# Patient Record
Sex: Female | Born: 1945 | Race: White | Hispanic: No | Marital: Married | Smoking: Never smoker
Health system: Southern US, Community
[De-identification: ages and names within clinical notes are randomized; demographics above are authoritative.]

## PROBLEM LIST (undated history)

## (undated) DIAGNOSIS — I2510 Atherosclerotic heart disease of native coronary artery without angina pectoris: Secondary | ICD-10-CM

## (undated) DIAGNOSIS — R51 Headache: Secondary | ICD-10-CM

## (undated) DIAGNOSIS — I2699 Other pulmonary embolism without acute cor pulmonale: Secondary | ICD-10-CM

## (undated) DIAGNOSIS — I10 Essential (primary) hypertension: Secondary | ICD-10-CM

## (undated) DIAGNOSIS — E785 Hyperlipidemia, unspecified: Secondary | ICD-10-CM

## (undated) DIAGNOSIS — Z8619 Personal history of other infectious and parasitic diseases: Secondary | ICD-10-CM

## (undated) DIAGNOSIS — M3500 Sicca syndrome, unspecified: Secondary | ICD-10-CM

## (undated) DIAGNOSIS — R7303 Prediabetes: Secondary | ICD-10-CM

## (undated) DIAGNOSIS — I639 Cerebral infarction, unspecified: Secondary | ICD-10-CM

## (undated) DIAGNOSIS — K219 Gastro-esophageal reflux disease without esophagitis: Secondary | ICD-10-CM

## (undated) DIAGNOSIS — H409 Unspecified glaucoma: Secondary | ICD-10-CM

## (undated) DIAGNOSIS — R519 Headache, unspecified: Secondary | ICD-10-CM

## (undated) HISTORY — DX: Hyperlipidemia, unspecified: E78.5

## (undated) HISTORY — PX: GANGLION CYST EXCISION: SHX1691

## (undated) HISTORY — PX: LASIK: SHX215

## (undated) HISTORY — PX: HERNIA REPAIR: SHX51

---

## 1996-02-28 DIAGNOSIS — I639 Cerebral infarction, unspecified: Secondary | ICD-10-CM

## 1996-02-28 HISTORY — DX: Cerebral infarction, unspecified: I63.9

## 1996-11-15 DIAGNOSIS — I6782 Cerebral ischemia: Secondary | ICD-10-CM | POA: Insufficient documentation

## 2004-03-01 ENCOUNTER — Ambulatory Visit: Payer: Self-pay | Admitting: Gastroenterology

## 2004-10-11 ENCOUNTER — Ambulatory Visit: Payer: Self-pay | Admitting: Unknown Physician Specialty

## 2005-02-27 DIAGNOSIS — I2699 Other pulmonary embolism without acute cor pulmonale: Secondary | ICD-10-CM

## 2005-02-27 HISTORY — DX: Other pulmonary embolism without acute cor pulmonale: I26.99

## 2005-02-27 HISTORY — PX: ANKLE FRACTURE SURGERY: SHX122

## 2005-10-23 ENCOUNTER — Ambulatory Visit: Payer: Self-pay | Admitting: Unknown Physician Specialty

## 2006-01-02 ENCOUNTER — Other Ambulatory Visit: Payer: Self-pay

## 2006-01-03 ENCOUNTER — Inpatient Hospital Stay: Payer: Self-pay | Admitting: Internal Medicine

## 2006-02-27 HISTORY — PX: KNEE ARTHROSCOPY W/ ACL RECONSTRUCTION: SHX1858

## 2006-06-15 ENCOUNTER — Ambulatory Visit: Payer: Self-pay | Admitting: Family Medicine

## 2006-06-19 ENCOUNTER — Ambulatory Visit: Payer: Self-pay | Admitting: Family Medicine

## 2006-07-17 ENCOUNTER — Ambulatory Visit: Payer: Self-pay

## 2006-10-26 ENCOUNTER — Ambulatory Visit: Payer: Self-pay | Admitting: Unknown Physician Specialty

## 2007-02-08 ENCOUNTER — Ambulatory Visit: Payer: Self-pay | Admitting: Family Medicine

## 2007-07-31 ENCOUNTER — Ambulatory Visit: Payer: Self-pay | Admitting: General Practice

## 2007-08-05 ENCOUNTER — Ambulatory Visit: Payer: Self-pay | Admitting: General Practice

## 2007-08-05 HISTORY — PX: KNEE SURGERY: SHX244

## 2007-11-06 ENCOUNTER — Ambulatory Visit: Payer: Self-pay | Admitting: Unknown Physician Specialty

## 2007-12-06 ENCOUNTER — Ambulatory Visit: Payer: Self-pay | Admitting: Family Medicine

## 2008-01-30 ENCOUNTER — Ambulatory Visit: Payer: Self-pay | Admitting: Family Medicine

## 2008-02-18 ENCOUNTER — Ambulatory Visit: Payer: Self-pay | Admitting: Family Medicine

## 2008-08-27 ENCOUNTER — Ambulatory Visit: Payer: Self-pay | Admitting: Family Medicine

## 2008-11-12 ENCOUNTER — Ambulatory Visit: Payer: Self-pay | Admitting: Unknown Physician Specialty

## 2009-01-28 ENCOUNTER — Ambulatory Visit: Payer: Self-pay | Admitting: Gastroenterology

## 2009-02-12 ENCOUNTER — Ambulatory Visit: Payer: Self-pay | Admitting: Surgery

## 2009-03-09 ENCOUNTER — Ambulatory Visit: Payer: Self-pay | Admitting: Gastroenterology

## 2009-08-17 ENCOUNTER — Ambulatory Visit: Payer: Self-pay | Admitting: Family Medicine

## 2009-08-19 ENCOUNTER — Encounter: Admission: RE | Admit: 2009-08-19 | Discharge: 2009-08-19 | Payer: Self-pay | Admitting: Neurology

## 2009-09-07 ENCOUNTER — Emergency Department: Payer: Self-pay | Admitting: Emergency Medicine

## 2009-09-08 ENCOUNTER — Ambulatory Visit: Payer: Self-pay | Admitting: Surgery

## 2009-09-16 ENCOUNTER — Inpatient Hospital Stay: Payer: Self-pay | Admitting: Surgery

## 2009-12-07 ENCOUNTER — Ambulatory Visit: Payer: Self-pay | Admitting: Unknown Physician Specialty

## 2010-02-27 HISTORY — PX: NISSEN FUNDOPLICATION: SHX2091

## 2010-05-19 ENCOUNTER — Other Ambulatory Visit: Payer: Self-pay | Admitting: Orthopedic Surgery

## 2010-05-19 DIAGNOSIS — M25562 Pain in left knee: Secondary | ICD-10-CM

## 2010-05-21 ENCOUNTER — Ambulatory Visit
Admission: RE | Admit: 2010-05-21 | Discharge: 2010-05-21 | Disposition: A | Discharge status: Home / Self Care | Payer: 59 | Source: Ambulatory Visit | Attending: Orthopedic Surgery | Admitting: Orthopedic Surgery

## 2010-05-21 DIAGNOSIS — M25562 Pain in left knee: Secondary | ICD-10-CM

## 2010-12-15 ENCOUNTER — Ambulatory Visit: Payer: Self-pay | Admitting: Unknown Physician Specialty

## 2010-12-20 ENCOUNTER — Emergency Department: Payer: Self-pay | Admitting: Internal Medicine

## 2010-12-22 ENCOUNTER — Ambulatory Visit: Payer: Self-pay | Admitting: Family Medicine

## 2010-12-23 ENCOUNTER — Ambulatory Visit: Payer: Self-pay | Admitting: Family Medicine

## 2011-01-03 ENCOUNTER — Ambulatory Visit: Payer: Self-pay | Admitting: Emergency Medicine

## 2011-03-02 DIAGNOSIS — R1084 Generalized abdominal pain: Secondary | ICD-10-CM | POA: Diagnosis not present

## 2011-03-02 DIAGNOSIS — R110 Nausea: Secondary | ICD-10-CM | POA: Diagnosis not present

## 2011-03-02 DIAGNOSIS — K5900 Constipation, unspecified: Secondary | ICD-10-CM | POA: Diagnosis not present

## 2011-03-06 DIAGNOSIS — F4320 Adjustment disorder, unspecified: Secondary | ICD-10-CM | POA: Diagnosis not present

## 2011-03-06 DIAGNOSIS — G43909 Migraine, unspecified, not intractable, without status migrainosus: Secondary | ICD-10-CM | POA: Diagnosis not present

## 2011-03-06 DIAGNOSIS — J019 Acute sinusitis, unspecified: Secondary | ICD-10-CM | POA: Diagnosis not present

## 2011-03-06 DIAGNOSIS — R109 Unspecified abdominal pain: Secondary | ICD-10-CM | POA: Diagnosis not present

## 2011-03-15 ENCOUNTER — Ambulatory Visit: Payer: Self-pay | Admitting: Gastroenterology

## 2011-03-15 DIAGNOSIS — J301 Allergic rhinitis due to pollen: Secondary | ICD-10-CM | POA: Diagnosis not present

## 2011-03-15 DIAGNOSIS — R1084 Generalized abdominal pain: Secondary | ICD-10-CM | POA: Diagnosis not present

## 2011-03-15 DIAGNOSIS — R109 Unspecified abdominal pain: Secondary | ICD-10-CM | POA: Diagnosis not present

## 2011-03-20 DIAGNOSIS — J301 Allergic rhinitis due to pollen: Secondary | ICD-10-CM | POA: Diagnosis not present

## 2011-03-22 ENCOUNTER — Ambulatory Visit: Payer: Self-pay | Admitting: Gastroenterology

## 2011-03-22 DIAGNOSIS — K219 Gastro-esophageal reflux disease without esophagitis: Secondary | ICD-10-CM | POA: Diagnosis not present

## 2011-03-22 DIAGNOSIS — R059 Cough, unspecified: Secondary | ICD-10-CM | POA: Diagnosis not present

## 2011-03-22 DIAGNOSIS — K449 Diaphragmatic hernia without obstruction or gangrene: Secondary | ICD-10-CM | POA: Diagnosis not present

## 2011-03-22 DIAGNOSIS — Z9109 Other allergy status, other than to drugs and biological substances: Secondary | ICD-10-CM | POA: Diagnosis not present

## 2011-03-22 DIAGNOSIS — H409 Unspecified glaucoma: Secondary | ICD-10-CM | POA: Diagnosis not present

## 2011-03-22 DIAGNOSIS — Z8679 Personal history of other diseases of the circulatory system: Secondary | ICD-10-CM | POA: Diagnosis not present

## 2011-03-22 DIAGNOSIS — Z79899 Other long term (current) drug therapy: Secondary | ICD-10-CM | POA: Diagnosis not present

## 2011-03-22 DIAGNOSIS — K5900 Constipation, unspecified: Secondary | ICD-10-CM | POA: Diagnosis not present

## 2011-03-22 DIAGNOSIS — E785 Hyperlipidemia, unspecified: Secondary | ICD-10-CM | POA: Diagnosis not present

## 2011-03-22 DIAGNOSIS — R1084 Generalized abdominal pain: Secondary | ICD-10-CM | POA: Diagnosis not present

## 2011-03-22 DIAGNOSIS — G43909 Migraine, unspecified, not intractable, without status migrainosus: Secondary | ICD-10-CM | POA: Diagnosis not present

## 2011-03-22 LAB — HM COLONOSCOPY

## 2011-04-06 DIAGNOSIS — H4010X0 Unspecified open-angle glaucoma, stage unspecified: Secondary | ICD-10-CM | POA: Diagnosis not present

## 2011-04-06 DIAGNOSIS — H2510 Age-related nuclear cataract, unspecified eye: Secondary | ICD-10-CM | POA: Diagnosis not present

## 2011-04-11 DIAGNOSIS — K589 Irritable bowel syndrome without diarrhea: Secondary | ICD-10-CM | POA: Diagnosis not present

## 2011-04-11 DIAGNOSIS — K5900 Constipation, unspecified: Secondary | ICD-10-CM | POA: Diagnosis not present

## 2011-04-21 DIAGNOSIS — J301 Allergic rhinitis due to pollen: Secondary | ICD-10-CM | POA: Diagnosis not present

## 2011-04-24 DIAGNOSIS — H4010X0 Unspecified open-angle glaucoma, stage unspecified: Secondary | ICD-10-CM | POA: Diagnosis not present

## 2011-04-24 DIAGNOSIS — J301 Allergic rhinitis due to pollen: Secondary | ICD-10-CM | POA: Diagnosis not present

## 2011-04-24 DIAGNOSIS — H2510 Age-related nuclear cataract, unspecified eye: Secondary | ICD-10-CM | POA: Diagnosis not present

## 2011-06-16 DIAGNOSIS — R51 Headache: Secondary | ICD-10-CM | POA: Diagnosis not present

## 2011-06-16 DIAGNOSIS — J301 Allergic rhinitis due to pollen: Secondary | ICD-10-CM | POA: Diagnosis not present

## 2011-07-21 DIAGNOSIS — J301 Allergic rhinitis due to pollen: Secondary | ICD-10-CM | POA: Diagnosis not present

## 2011-07-24 DIAGNOSIS — J301 Allergic rhinitis due to pollen: Secondary | ICD-10-CM | POA: Diagnosis not present

## 2011-08-16 DIAGNOSIS — H4010X0 Unspecified open-angle glaucoma, stage unspecified: Secondary | ICD-10-CM | POA: Diagnosis not present

## 2011-08-16 DIAGNOSIS — H2510 Age-related nuclear cataract, unspecified eye: Secondary | ICD-10-CM | POA: Diagnosis not present

## 2011-10-13 DIAGNOSIS — J029 Acute pharyngitis, unspecified: Secondary | ICD-10-CM | POA: Diagnosis not present

## 2011-10-13 DIAGNOSIS — R911 Solitary pulmonary nodule: Secondary | ICD-10-CM | POA: Diagnosis not present

## 2011-10-13 DIAGNOSIS — G43909 Migraine, unspecified, not intractable, without status migrainosus: Secondary | ICD-10-CM | POA: Diagnosis not present

## 2011-10-13 DIAGNOSIS — F4320 Adjustment disorder, unspecified: Secondary | ICD-10-CM | POA: Diagnosis not present

## 2011-10-18 ENCOUNTER — Ambulatory Visit: Payer: Self-pay | Admitting: Family Medicine

## 2011-10-18 DIAGNOSIS — K449 Diaphragmatic hernia without obstruction or gangrene: Secondary | ICD-10-CM | POA: Diagnosis not present

## 2011-10-18 DIAGNOSIS — J301 Allergic rhinitis due to pollen: Secondary | ICD-10-CM | POA: Diagnosis not present

## 2011-10-18 DIAGNOSIS — R911 Solitary pulmonary nodule: Secondary | ICD-10-CM | POA: Diagnosis not present

## 2011-10-23 DIAGNOSIS — J301 Allergic rhinitis due to pollen: Secondary | ICD-10-CM | POA: Diagnosis not present

## 2011-12-01 DIAGNOSIS — R5383 Other fatigue: Secondary | ICD-10-CM | POA: Diagnosis not present

## 2011-12-01 DIAGNOSIS — E7800 Pure hypercholesterolemia, unspecified: Secondary | ICD-10-CM | POA: Diagnosis not present

## 2011-12-01 DIAGNOSIS — Z0000 Encounter for general adult medical examination without abnormal findings: Secondary | ICD-10-CM | POA: Diagnosis not present

## 2011-12-01 DIAGNOSIS — R5381 Other malaise: Secondary | ICD-10-CM | POA: Diagnosis not present

## 2011-12-01 DIAGNOSIS — F4320 Adjustment disorder, unspecified: Secondary | ICD-10-CM | POA: Diagnosis not present

## 2011-12-01 DIAGNOSIS — Z23 Encounter for immunization: Secondary | ICD-10-CM | POA: Diagnosis not present

## 2011-12-04 DIAGNOSIS — R5383 Other fatigue: Secondary | ICD-10-CM | POA: Diagnosis not present

## 2011-12-04 DIAGNOSIS — R7309 Other abnormal glucose: Secondary | ICD-10-CM | POA: Diagnosis not present

## 2011-12-04 DIAGNOSIS — E559 Vitamin D deficiency, unspecified: Secondary | ICD-10-CM | POA: Diagnosis not present

## 2011-12-04 DIAGNOSIS — R5381 Other malaise: Secondary | ICD-10-CM | POA: Diagnosis not present

## 2011-12-04 DIAGNOSIS — R109 Unspecified abdominal pain: Secondary | ICD-10-CM | POA: Diagnosis not present

## 2011-12-04 DIAGNOSIS — E7800 Pure hypercholesterolemia, unspecified: Secondary | ICD-10-CM | POA: Diagnosis not present

## 2012-01-08 DIAGNOSIS — R079 Chest pain, unspecified: Secondary | ICD-10-CM | POA: Diagnosis not present

## 2012-01-08 DIAGNOSIS — G4730 Sleep apnea, unspecified: Secondary | ICD-10-CM | POA: Diagnosis not present

## 2012-01-08 DIAGNOSIS — F4320 Adjustment disorder, unspecified: Secondary | ICD-10-CM | POA: Diagnosis not present

## 2012-01-08 DIAGNOSIS — E7800 Pure hypercholesterolemia, unspecified: Secondary | ICD-10-CM | POA: Diagnosis not present

## 2012-01-15 DIAGNOSIS — E7800 Pure hypercholesterolemia, unspecified: Secondary | ICD-10-CM | POA: Diagnosis not present

## 2012-01-19 DIAGNOSIS — J301 Allergic rhinitis due to pollen: Secondary | ICD-10-CM | POA: Diagnosis not present

## 2012-01-21 ENCOUNTER — Ambulatory Visit: Payer: Self-pay | Admitting: Family Medicine

## 2012-01-21 DIAGNOSIS — R0609 Other forms of dyspnea: Secondary | ICD-10-CM | POA: Diagnosis not present

## 2012-01-21 DIAGNOSIS — R0902 Hypoxemia: Secondary | ICD-10-CM | POA: Diagnosis not present

## 2012-01-21 DIAGNOSIS — G4736 Sleep related hypoventilation in conditions classified elsewhere: Secondary | ICD-10-CM | POA: Diagnosis not present

## 2012-01-21 DIAGNOSIS — G4733 Obstructive sleep apnea (adult) (pediatric): Secondary | ICD-10-CM | POA: Diagnosis not present

## 2012-01-22 DIAGNOSIS — J301 Allergic rhinitis due to pollen: Secondary | ICD-10-CM | POA: Diagnosis not present

## 2012-02-02 ENCOUNTER — Ambulatory Visit: Payer: Self-pay | Admitting: Family Medicine

## 2012-02-02 DIAGNOSIS — R0989 Other specified symptoms and signs involving the circulatory and respiratory systems: Secondary | ICD-10-CM | POA: Diagnosis not present

## 2012-02-02 DIAGNOSIS — R0609 Other forms of dyspnea: Secondary | ICD-10-CM | POA: Diagnosis not present

## 2012-02-02 DIAGNOSIS — G4733 Obstructive sleep apnea (adult) (pediatric): Secondary | ICD-10-CM | POA: Diagnosis not present

## 2012-02-08 DIAGNOSIS — H4010X0 Unspecified open-angle glaucoma, stage unspecified: Secondary | ICD-10-CM | POA: Diagnosis not present

## 2012-02-08 DIAGNOSIS — H2510 Age-related nuclear cataract, unspecified eye: Secondary | ICD-10-CM | POA: Diagnosis not present

## 2012-03-06 ENCOUNTER — Other Ambulatory Visit: Payer: Self-pay | Admitting: Family Medicine

## 2012-03-06 DIAGNOSIS — R059 Cough, unspecified: Secondary | ICD-10-CM | POA: Diagnosis not present

## 2012-03-06 DIAGNOSIS — L989 Disorder of the skin and subcutaneous tissue, unspecified: Secondary | ICD-10-CM | POA: Diagnosis not present

## 2012-03-06 DIAGNOSIS — G4730 Sleep apnea, unspecified: Secondary | ICD-10-CM | POA: Diagnosis not present

## 2012-03-06 DIAGNOSIS — F4320 Adjustment disorder, unspecified: Secondary | ICD-10-CM | POA: Diagnosis not present

## 2012-03-06 DIAGNOSIS — E7800 Pure hypercholesterolemia, unspecified: Secondary | ICD-10-CM | POA: Diagnosis not present

## 2012-03-06 DIAGNOSIS — R05 Cough: Secondary | ICD-10-CM | POA: Diagnosis not present

## 2012-03-06 LAB — COMPREHENSIVE METABOLIC PANEL
Albumin: 4.2 g/dL (ref 3.4–5.0)
Alkaline Phosphatase: 64 U/L (ref 50–136)
Anion Gap: 6 — ABNORMAL LOW (ref 7–16)
BUN: 16 mg/dL (ref 7–18)
Bilirubin,Total: 0.5 mg/dL (ref 0.2–1.0)
Calcium, Total: 9.3 mg/dL (ref 8.5–10.1)
Chloride: 105 mmol/L (ref 98–107)
Co2: 28 mmol/L (ref 21–32)
Creatinine: 0.8 mg/dL (ref 0.60–1.30)
EGFR (African American): >60
EGFR (Non-African Amer.): >60
Glucose: 92 mg/dL (ref 65–99)
Osmolality: 278 (ref 275–301)
Potassium: 4.3 mmol/L (ref 3.5–5.1)
SGOT(AST): 26 U/L (ref 15–37)
SGPT (ALT): 29 U/L (ref 12–78)
Sodium: 139 mmol/L (ref 136–145)
Total Protein: 7.7 g/dL (ref 6.4–8.2)

## 2012-03-06 LAB — LIPID PANEL
Cholesterol: 166 mg/dL (ref 0–200)
HDL Cholesterol: 76 mg/dL — ABNORMAL HIGH (ref 40–60)
Ldl Cholesterol, Calc: 71 mg/dL (ref 0–100)
Triglycerides: 93 mg/dL (ref 0–200)
VLDL Cholesterol, Calc: 19 mg/dL (ref 5–40)

## 2012-03-06 LAB — CK: CK, Total: 114 U/L (ref 21–215)

## 2012-03-22 DIAGNOSIS — F4320 Adjustment disorder, unspecified: Secondary | ICD-10-CM | POA: Diagnosis not present

## 2012-03-22 DIAGNOSIS — G4730 Sleep apnea, unspecified: Secondary | ICD-10-CM | POA: Diagnosis not present

## 2012-03-22 DIAGNOSIS — J019 Acute sinusitis, unspecified: Secondary | ICD-10-CM | POA: Diagnosis not present

## 2012-03-22 DIAGNOSIS — L989 Disorder of the skin and subcutaneous tissue, unspecified: Secondary | ICD-10-CM | POA: Diagnosis not present

## 2012-03-27 DIAGNOSIS — G4733 Obstructive sleep apnea (adult) (pediatric): Secondary | ICD-10-CM | POA: Diagnosis not present

## 2012-03-27 DIAGNOSIS — R0609 Other forms of dyspnea: Secondary | ICD-10-CM | POA: Diagnosis not present

## 2012-03-27 DIAGNOSIS — R0989 Other specified symptoms and signs involving the circulatory and respiratory systems: Secondary | ICD-10-CM | POA: Diagnosis not present

## 2012-04-03 DIAGNOSIS — H04129 Dry eye syndrome of unspecified lacrimal gland: Secondary | ICD-10-CM | POA: Diagnosis not present

## 2012-04-03 DIAGNOSIS — H4010X0 Unspecified open-angle glaucoma, stage unspecified: Secondary | ICD-10-CM | POA: Diagnosis not present

## 2012-04-19 DIAGNOSIS — J301 Allergic rhinitis due to pollen: Secondary | ICD-10-CM | POA: Diagnosis not present

## 2012-04-22 DIAGNOSIS — J301 Allergic rhinitis due to pollen: Secondary | ICD-10-CM | POA: Diagnosis not present

## 2012-06-12 DIAGNOSIS — R030 Elevated blood-pressure reading, without diagnosis of hypertension: Secondary | ICD-10-CM | POA: Diagnosis not present

## 2012-06-12 DIAGNOSIS — G4730 Sleep apnea, unspecified: Secondary | ICD-10-CM | POA: Diagnosis not present

## 2012-06-12 DIAGNOSIS — E7800 Pure hypercholesterolemia, unspecified: Secondary | ICD-10-CM | POA: Diagnosis not present

## 2012-06-12 DIAGNOSIS — F4320 Adjustment disorder, unspecified: Secondary | ICD-10-CM | POA: Diagnosis not present

## 2012-06-16 ENCOUNTER — Emergency Department: Payer: Self-pay | Admitting: Emergency Medicine

## 2012-06-16 DIAGNOSIS — Z8673 Personal history of transient ischemic attack (TIA), and cerebral infarction without residual deficits: Secondary | ICD-10-CM | POA: Diagnosis not present

## 2012-06-16 DIAGNOSIS — K602 Anal fissure, unspecified: Secondary | ICD-10-CM | POA: Diagnosis not present

## 2012-06-16 DIAGNOSIS — Z79899 Other long term (current) drug therapy: Secondary | ICD-10-CM | POA: Diagnosis not present

## 2012-06-16 DIAGNOSIS — K649 Unspecified hemorrhoids: Secondary | ICD-10-CM | POA: Diagnosis not present

## 2012-06-16 DIAGNOSIS — Z7982 Long term (current) use of aspirin: Secondary | ICD-10-CM | POA: Diagnosis not present

## 2012-06-16 DIAGNOSIS — K5900 Constipation, unspecified: Secondary | ICD-10-CM | POA: Diagnosis not present

## 2012-06-16 DIAGNOSIS — Z888 Allergy status to other drugs, medicaments and biological substances status: Secondary | ICD-10-CM | POA: Diagnosis not present

## 2012-06-16 LAB — CBC
HCT: 42.5 % (ref 35.0–47.0)
HGB: 14.4 g/dL (ref 12.0–16.0)
MCH: 32 pg (ref 26.0–34.0)
MCHC: 33.8 g/dL (ref 32.0–36.0)
MCV: 95 fL (ref 80–100)
Platelet: 237 10*3/uL (ref 150–440)
RBC: 4.49 10*6/uL (ref 3.80–5.20)
RDW: 13.1 % (ref 11.5–14.5)
WBC: 8.6 10*3/uL (ref 3.6–11.0)

## 2012-06-16 LAB — COMPREHENSIVE METABOLIC PANEL
Albumin: 3.8 g/dL (ref 3.4–5.0)
Alkaline Phosphatase: 63 U/L (ref 50–136)
Anion Gap: 4 — ABNORMAL LOW (ref 7–16)
BUN: 15 mg/dL (ref 7–18)
Bilirubin,Total: 0.2 mg/dL (ref 0.2–1.0)
Calcium, Total: 8.8 mg/dL (ref 8.5–10.1)
Chloride: 105 mmol/L (ref 98–107)
Co2: 30 mmol/L (ref 21–32)
Creatinine: 0.83 mg/dL (ref 0.60–1.30)
EGFR (African American): >60
EGFR (Non-African Amer.): >60
Glucose: 110 mg/dL — ABNORMAL HIGH (ref 65–99)
Osmolality: 279 (ref 275–301)
Potassium: 4.2 mmol/L (ref 3.5–5.1)
SGOT(AST): 27 U/L (ref 15–37)
SGPT (ALT): 30 U/L (ref 12–78)
Sodium: 139 mmol/L (ref 136–145)
Total Protein: 7.1 g/dL (ref 6.4–8.2)

## 2012-07-16 DIAGNOSIS — J301 Allergic rhinitis due to pollen: Secondary | ICD-10-CM | POA: Diagnosis not present

## 2012-07-24 DIAGNOSIS — J301 Allergic rhinitis due to pollen: Secondary | ICD-10-CM | POA: Diagnosis not present

## 2012-08-13 DIAGNOSIS — H4010X0 Unspecified open-angle glaucoma, stage unspecified: Secondary | ICD-10-CM | POA: Diagnosis not present

## 2012-08-13 DIAGNOSIS — H04129 Dry eye syndrome of unspecified lacrimal gland: Secondary | ICD-10-CM | POA: Diagnosis not present

## 2012-08-22 DIAGNOSIS — R059 Cough, unspecified: Secondary | ICD-10-CM | POA: Diagnosis not present

## 2012-08-22 DIAGNOSIS — J329 Chronic sinusitis, unspecified: Secondary | ICD-10-CM | POA: Diagnosis not present

## 2012-08-22 DIAGNOSIS — R05 Cough: Secondary | ICD-10-CM | POA: Diagnosis not present

## 2012-08-22 DIAGNOSIS — K5900 Constipation, unspecified: Secondary | ICD-10-CM | POA: Diagnosis not present

## 2012-08-22 DIAGNOSIS — J309 Allergic rhinitis, unspecified: Secondary | ICD-10-CM | POA: Diagnosis not present

## 2012-10-11 DIAGNOSIS — J301 Allergic rhinitis due to pollen: Secondary | ICD-10-CM | POA: Diagnosis not present

## 2012-10-16 DIAGNOSIS — J301 Allergic rhinitis due to pollen: Secondary | ICD-10-CM | POA: Diagnosis not present

## 2012-10-18 DIAGNOSIS — J301 Allergic rhinitis due to pollen: Secondary | ICD-10-CM | POA: Diagnosis not present

## 2012-11-06 DIAGNOSIS — H2510 Age-related nuclear cataract, unspecified eye: Secondary | ICD-10-CM | POA: Diagnosis not present

## 2012-11-06 DIAGNOSIS — H4010X0 Unspecified open-angle glaucoma, stage unspecified: Secondary | ICD-10-CM | POA: Diagnosis not present

## 2012-11-06 DIAGNOSIS — G518 Other disorders of facial nerve: Secondary | ICD-10-CM | POA: Diagnosis not present

## 2012-11-06 DIAGNOSIS — H04129 Dry eye syndrome of unspecified lacrimal gland: Secondary | ICD-10-CM | POA: Diagnosis not present

## 2012-12-05 DIAGNOSIS — Z23 Encounter for immunization: Secondary | ICD-10-CM | POA: Diagnosis not present

## 2012-12-06 DIAGNOSIS — R1011 Right upper quadrant pain: Secondary | ICD-10-CM | POA: Diagnosis not present

## 2012-12-06 DIAGNOSIS — J309 Allergic rhinitis, unspecified: Secondary | ICD-10-CM | POA: Diagnosis not present

## 2012-12-06 DIAGNOSIS — Z1331 Encounter for screening for depression: Secondary | ICD-10-CM | POA: Diagnosis not present

## 2012-12-06 DIAGNOSIS — F329 Major depressive disorder, single episode, unspecified: Secondary | ICD-10-CM | POA: Diagnosis not present

## 2012-12-06 DIAGNOSIS — R109 Unspecified abdominal pain: Secondary | ICD-10-CM | POA: Diagnosis not present

## 2012-12-09 ENCOUNTER — Ambulatory Visit: Payer: Self-pay | Admitting: Family Medicine

## 2012-12-09 DIAGNOSIS — R141 Gas pain: Secondary | ICD-10-CM | POA: Diagnosis not present

## 2012-12-09 DIAGNOSIS — R1011 Right upper quadrant pain: Secondary | ICD-10-CM | POA: Diagnosis not present

## 2012-12-09 DIAGNOSIS — K7689 Other specified diseases of liver: Secondary | ICD-10-CM | POA: Diagnosis not present

## 2013-01-21 DIAGNOSIS — J301 Allergic rhinitis due to pollen: Secondary | ICD-10-CM | POA: Diagnosis not present

## 2013-01-27 DIAGNOSIS — J301 Allergic rhinitis due to pollen: Secondary | ICD-10-CM | POA: Diagnosis not present

## 2013-02-10 DIAGNOSIS — J309 Allergic rhinitis, unspecified: Secondary | ICD-10-CM | POA: Diagnosis not present

## 2013-02-10 DIAGNOSIS — F329 Major depressive disorder, single episode, unspecified: Secondary | ICD-10-CM | POA: Diagnosis not present

## 2013-02-10 DIAGNOSIS — J019 Acute sinusitis, unspecified: Secondary | ICD-10-CM | POA: Diagnosis not present

## 2013-02-10 DIAGNOSIS — K5900 Constipation, unspecified: Secondary | ICD-10-CM | POA: Diagnosis not present

## 2013-03-24 DIAGNOSIS — F3289 Other specified depressive episodes: Secondary | ICD-10-CM | POA: Diagnosis not present

## 2013-03-24 DIAGNOSIS — K5900 Constipation, unspecified: Secondary | ICD-10-CM | POA: Diagnosis not present

## 2013-03-24 DIAGNOSIS — M7989 Other specified soft tissue disorders: Secondary | ICD-10-CM | POA: Diagnosis not present

## 2013-03-24 DIAGNOSIS — F329 Major depressive disorder, single episode, unspecified: Secondary | ICD-10-CM | POA: Diagnosis not present

## 2013-03-24 DIAGNOSIS — J309 Allergic rhinitis, unspecified: Secondary | ICD-10-CM | POA: Diagnosis not present

## 2013-04-14 ENCOUNTER — Ambulatory Visit: Payer: Self-pay | Admitting: Family Medicine

## 2013-04-14 DIAGNOSIS — Z1231 Encounter for screening mammogram for malignant neoplasm of breast: Secondary | ICD-10-CM | POA: Diagnosis not present

## 2013-04-14 LAB — HM MAMMOGRAPHY

## 2013-04-22 DIAGNOSIS — J301 Allergic rhinitis due to pollen: Secondary | ICD-10-CM | POA: Diagnosis not present

## 2013-04-28 DIAGNOSIS — J301 Allergic rhinitis due to pollen: Secondary | ICD-10-CM | POA: Diagnosis not present

## 2013-04-29 DIAGNOSIS — H4011X0 Primary open-angle glaucoma, stage unspecified: Secondary | ICD-10-CM | POA: Diagnosis not present

## 2013-04-29 DIAGNOSIS — H04129 Dry eye syndrome of unspecified lacrimal gland: Secondary | ICD-10-CM | POA: Diagnosis not present

## 2013-04-29 DIAGNOSIS — H43819 Vitreous degeneration, unspecified eye: Secondary | ICD-10-CM | POA: Diagnosis not present

## 2013-04-29 DIAGNOSIS — H2510 Age-related nuclear cataract, unspecified eye: Secondary | ICD-10-CM | POA: Diagnosis not present

## 2013-05-05 DIAGNOSIS — T8579XA Infection and inflammatory reaction due to other internal prosthetic devices, implants and grafts, initial encounter: Secondary | ICD-10-CM | POA: Diagnosis not present

## 2013-05-05 DIAGNOSIS — M76829 Posterior tibial tendinitis, unspecified leg: Secondary | ICD-10-CM | POA: Diagnosis not present

## 2013-05-13 LAB — HEPATIC FUNCTION PANEL
ALT: 16 U/L (ref 7–35)
AST: 16 U/L (ref 13–35)
Alkaline Phosphatase: 72 U/L (ref 25–125)
Bilirubin, Total: 0.3 mg/dL

## 2013-05-13 LAB — HEMOGLOBIN A1C: Hgb A1c MFr Bld: 5.9 % (ref 4.0–6.0)

## 2013-05-13 LAB — BASIC METABOLIC PANEL
BUN: 11 mg/dL (ref 4–21)
Creatinine: 0.7 mg/dL (ref 0.5–1.1)
Glucose: 89 mg/dL
Potassium: 4.9 mmol/L (ref 3.4–5.3)
Sodium: 142 mmol/L (ref 137–147)

## 2013-05-13 LAB — TSH: TSH: 1.48 u[IU]/mL (ref 0.41–5.90)

## 2013-05-19 DIAGNOSIS — T8579XA Infection and inflammatory reaction due to other internal prosthetic devices, implants and grafts, initial encounter: Secondary | ICD-10-CM | POA: Diagnosis not present

## 2013-05-19 DIAGNOSIS — M76829 Posterior tibial tendinitis, unspecified leg: Secondary | ICD-10-CM | POA: Diagnosis not present

## 2013-05-26 DIAGNOSIS — K7689 Other specified diseases of liver: Secondary | ICD-10-CM | POA: Diagnosis not present

## 2013-06-04 DIAGNOSIS — E7800 Pure hypercholesterolemia, unspecified: Secondary | ICD-10-CM | POA: Diagnosis not present

## 2013-06-04 DIAGNOSIS — E559 Vitamin D deficiency, unspecified: Secondary | ICD-10-CM | POA: Diagnosis not present

## 2013-06-04 DIAGNOSIS — R7309 Other abnormal glucose: Secondary | ICD-10-CM | POA: Diagnosis not present

## 2013-06-04 DIAGNOSIS — K5900 Constipation, unspecified: Secondary | ICD-10-CM | POA: Diagnosis not present

## 2013-06-09 DIAGNOSIS — M76829 Posterior tibial tendinitis, unspecified leg: Secondary | ICD-10-CM | POA: Diagnosis not present

## 2013-06-17 ENCOUNTER — Emergency Department: Payer: Self-pay | Admitting: Emergency Medicine

## 2013-06-17 DIAGNOSIS — K589 Irritable bowel syndrome without diarrhea: Secondary | ICD-10-CM | POA: Diagnosis not present

## 2013-06-17 DIAGNOSIS — K7689 Other specified diseases of liver: Secondary | ICD-10-CM | POA: Diagnosis not present

## 2013-06-17 DIAGNOSIS — I10 Essential (primary) hypertension: Secondary | ICD-10-CM | POA: Diagnosis not present

## 2013-06-17 DIAGNOSIS — K219 Gastro-esophageal reflux disease without esophagitis: Secondary | ICD-10-CM | POA: Diagnosis not present

## 2013-06-17 DIAGNOSIS — R109 Unspecified abdominal pain: Secondary | ICD-10-CM | POA: Diagnosis not present

## 2013-06-17 DIAGNOSIS — Z8673 Personal history of transient ischemic attack (TIA), and cerebral infarction without residual deficits: Secondary | ICD-10-CM | POA: Diagnosis not present

## 2013-06-17 DIAGNOSIS — H409 Unspecified glaucoma: Secondary | ICD-10-CM | POA: Diagnosis not present

## 2013-06-17 DIAGNOSIS — Z79899 Other long term (current) drug therapy: Secondary | ICD-10-CM | POA: Diagnosis not present

## 2013-06-17 DIAGNOSIS — R197 Diarrhea, unspecified: Secondary | ICD-10-CM | POA: Diagnosis not present

## 2013-06-17 LAB — URINALYSIS, COMPLETE
Bacteria: NONE SEEN
Bilirubin,UR: NEGATIVE
Glucose,UR: NEGATIVE mg/dL (ref 0–75)
Granular Cast: 3
Hyaline Cast: 24
Nitrite: NEGATIVE
Ph: 5 (ref 4.5–8.0)
Protein: 30
RBC,UR: 11 /HPF (ref 0–5)
Specific Gravity: 1.019 (ref 1.003–1.030)
Squamous Epithelial: 1
WBC UR: 6 /HPF (ref 0–5)

## 2013-06-17 LAB — COMPREHENSIVE METABOLIC PANEL
Albumin: 4.9 g/dL (ref 3.4–5.0)
Alkaline Phosphatase: 78 U/L
Anion Gap: 9 (ref 7–16)
BUN: 19 mg/dL — ABNORMAL HIGH (ref 7–18)
Bilirubin,Total: 0.3 mg/dL (ref 0.2–1.0)
Calcium, Total: 10.8 mg/dL — ABNORMAL HIGH (ref 8.5–10.1)
Chloride: 105 mmol/L (ref 98–107)
Co2: 24 mmol/L (ref 21–32)
Creatinine: 1.15 mg/dL (ref 0.60–1.30)
EGFR (African American): 57 — ABNORMAL LOW
EGFR (Non-African Amer.): 49 — ABNORMAL LOW
Glucose: 151 mg/dL — ABNORMAL HIGH (ref 65–99)
Osmolality: 281 (ref 275–301)
Potassium: 4.1 mmol/L (ref 3.5–5.1)
SGOT(AST): 39 U/L — ABNORMAL HIGH (ref 15–37)
SGPT (ALT): 32 U/L (ref 12–78)
Sodium: 138 mmol/L (ref 136–145)
Total Protein: 9 g/dL — ABNORMAL HIGH (ref 6.4–8.2)

## 2013-06-17 LAB — CBC
HCT: 53.5 % — ABNORMAL HIGH (ref 35.0–47.0)
HGB: 17.8 g/dL — ABNORMAL HIGH (ref 12.0–16.0)
MCH: 31.3 pg (ref 26.0–34.0)
MCHC: 33.2 g/dL (ref 32.0–36.0)
MCV: 94 fL (ref 80–100)
Platelet: 287 10*3/uL (ref 150–440)
RBC: 5.67 10*6/uL — ABNORMAL HIGH (ref 3.80–5.20)
RDW: 12.9 % (ref 11.5–14.5)
WBC: 11.4 10*3/uL — ABNORMAL HIGH (ref 3.6–11.0)

## 2013-06-17 LAB — TROPONIN I: Troponin-I: <0.02 ng/mL

## 2013-06-17 LAB — CK TOTAL AND CKMB (NOT AT ARMC)
CK, Total: 94 U/L
CK-MB: 1 ng/mL (ref 0.5–3.6)

## 2013-06-18 DIAGNOSIS — K7689 Other specified diseases of liver: Secondary | ICD-10-CM | POA: Diagnosis not present

## 2013-06-25 ENCOUNTER — Ambulatory Visit: Payer: Self-pay | Admitting: Family Medicine

## 2013-06-25 DIAGNOSIS — S060X9A Concussion with loss of consciousness of unspecified duration, initial encounter: Secondary | ICD-10-CM | POA: Diagnosis not present

## 2013-06-25 DIAGNOSIS — F3289 Other specified depressive episodes: Secondary | ICD-10-CM | POA: Diagnosis not present

## 2013-06-25 DIAGNOSIS — F329 Major depressive disorder, single episode, unspecified: Secondary | ICD-10-CM | POA: Diagnosis not present

## 2013-06-25 DIAGNOSIS — S060XAA Concussion with loss of consciousness status unknown, initial encounter: Secondary | ICD-10-CM | POA: Diagnosis not present

## 2013-06-25 DIAGNOSIS — J309 Allergic rhinitis, unspecified: Secondary | ICD-10-CM | POA: Diagnosis not present

## 2013-06-25 DIAGNOSIS — R51 Headache: Secondary | ICD-10-CM | POA: Diagnosis not present

## 2013-06-25 DIAGNOSIS — M7989 Other specified soft tissue disorders: Secondary | ICD-10-CM | POA: Diagnosis not present

## 2013-06-25 DIAGNOSIS — R413 Other amnesia: Secondary | ICD-10-CM | POA: Diagnosis not present

## 2013-06-25 DIAGNOSIS — S0990XA Unspecified injury of head, initial encounter: Secondary | ICD-10-CM | POA: Diagnosis not present

## 2013-06-26 DIAGNOSIS — R55 Syncope and collapse: Secondary | ICD-10-CM | POA: Diagnosis not present

## 2013-06-26 DIAGNOSIS — R933 Abnormal findings on diagnostic imaging of other parts of digestive tract: Secondary | ICD-10-CM | POA: Diagnosis not present

## 2013-07-10 DIAGNOSIS — M7989 Other specified soft tissue disorders: Secondary | ICD-10-CM | POA: Diagnosis not present

## 2013-07-10 DIAGNOSIS — J309 Allergic rhinitis, unspecified: Secondary | ICD-10-CM | POA: Diagnosis not present

## 2013-07-10 DIAGNOSIS — S060XAA Concussion with loss of consciousness status unknown, initial encounter: Secondary | ICD-10-CM | POA: Diagnosis not present

## 2013-07-10 DIAGNOSIS — F329 Major depressive disorder, single episode, unspecified: Secondary | ICD-10-CM | POA: Diagnosis not present

## 2013-07-10 DIAGNOSIS — S060X9A Concussion with loss of consciousness of unspecified duration, initial encounter: Secondary | ICD-10-CM | POA: Diagnosis not present

## 2013-07-10 DIAGNOSIS — F3289 Other specified depressive episodes: Secondary | ICD-10-CM | POA: Diagnosis not present

## 2013-07-22 DIAGNOSIS — N952 Postmenopausal atrophic vaginitis: Secondary | ICD-10-CM | POA: Diagnosis not present

## 2013-07-22 DIAGNOSIS — Z01419 Encounter for gynecological examination (general) (routine) without abnormal findings: Secondary | ICD-10-CM | POA: Diagnosis not present

## 2013-07-24 DIAGNOSIS — J301 Allergic rhinitis due to pollen: Secondary | ICD-10-CM | POA: Diagnosis not present

## 2013-07-28 DIAGNOSIS — F329 Major depressive disorder, single episode, unspecified: Secondary | ICD-10-CM | POA: Diagnosis not present

## 2013-07-28 DIAGNOSIS — F3289 Other specified depressive episodes: Secondary | ICD-10-CM | POA: Diagnosis not present

## 2013-07-28 DIAGNOSIS — D582 Other hemoglobinopathies: Secondary | ICD-10-CM | POA: Diagnosis not present

## 2013-07-28 DIAGNOSIS — S060X9A Concussion with loss of consciousness of unspecified duration, initial encounter: Secondary | ICD-10-CM | POA: Diagnosis not present

## 2013-07-28 DIAGNOSIS — J309 Allergic rhinitis, unspecified: Secondary | ICD-10-CM | POA: Diagnosis not present

## 2013-07-28 DIAGNOSIS — S060XAA Concussion with loss of consciousness status unknown, initial encounter: Secondary | ICD-10-CM | POA: Diagnosis not present

## 2013-07-28 DIAGNOSIS — J301 Allergic rhinitis due to pollen: Secondary | ICD-10-CM | POA: Diagnosis not present

## 2013-08-14 ENCOUNTER — Ambulatory Visit: Payer: Self-pay | Admitting: Obstetrics and Gynecology

## 2013-08-14 DIAGNOSIS — M949 Disorder of cartilage, unspecified: Secondary | ICD-10-CM | POA: Diagnosis not present

## 2013-08-14 DIAGNOSIS — M899 Disorder of bone, unspecified: Secondary | ICD-10-CM | POA: Diagnosis not present

## 2013-08-14 DIAGNOSIS — N951 Menopausal and female climacteric states: Secondary | ICD-10-CM | POA: Diagnosis not present

## 2013-08-27 DIAGNOSIS — B373 Candidiasis of vulva and vagina: Secondary | ICD-10-CM | POA: Diagnosis not present

## 2013-08-27 DIAGNOSIS — F329 Major depressive disorder, single episode, unspecified: Secondary | ICD-10-CM | POA: Diagnosis not present

## 2013-08-27 DIAGNOSIS — S060XAA Concussion with loss of consciousness status unknown, initial encounter: Secondary | ICD-10-CM | POA: Diagnosis not present

## 2013-08-27 DIAGNOSIS — S060X9A Concussion with loss of consciousness of unspecified duration, initial encounter: Secondary | ICD-10-CM | POA: Diagnosis not present

## 2013-08-27 DIAGNOSIS — B3731 Acute candidiasis of vulva and vagina: Secondary | ICD-10-CM | POA: Diagnosis not present

## 2013-08-27 DIAGNOSIS — J309 Allergic rhinitis, unspecified: Secondary | ICD-10-CM | POA: Diagnosis not present

## 2013-08-27 DIAGNOSIS — F3289 Other specified depressive episodes: Secondary | ICD-10-CM | POA: Diagnosis not present

## 2013-09-04 DIAGNOSIS — Z8742 Personal history of other diseases of the female genital tract: Secondary | ICD-10-CM | POA: Diagnosis not present

## 2013-09-04 DIAGNOSIS — Z09 Encounter for follow-up examination after completed treatment for conditions other than malignant neoplasm: Secondary | ICD-10-CM | POA: Diagnosis not present

## 2013-09-04 DIAGNOSIS — Z8619 Personal history of other infectious and parasitic diseases: Secondary | ICD-10-CM | POA: Diagnosis not present

## 2013-09-08 DIAGNOSIS — J309 Allergic rhinitis, unspecified: Secondary | ICD-10-CM | POA: Diagnosis not present

## 2013-09-08 DIAGNOSIS — S060X9A Concussion with loss of consciousness of unspecified duration, initial encounter: Secondary | ICD-10-CM | POA: Diagnosis not present

## 2013-09-08 DIAGNOSIS — R030 Elevated blood-pressure reading, without diagnosis of hypertension: Secondary | ICD-10-CM | POA: Diagnosis not present

## 2013-09-08 DIAGNOSIS — S060XAA Concussion with loss of consciousness status unknown, initial encounter: Secondary | ICD-10-CM | POA: Diagnosis not present

## 2013-09-08 DIAGNOSIS — R279 Unspecified lack of coordination: Secondary | ICD-10-CM | POA: Diagnosis not present

## 2013-09-11 ENCOUNTER — Ambulatory Visit: Payer: Self-pay | Admitting: Family Medicine

## 2013-09-11 DIAGNOSIS — R279 Unspecified lack of coordination: Secondary | ICD-10-CM | POA: Diagnosis not present

## 2013-09-11 DIAGNOSIS — S0990XA Unspecified injury of head, initial encounter: Secondary | ICD-10-CM | POA: Diagnosis not present

## 2013-09-16 DIAGNOSIS — N898 Other specified noninflammatory disorders of vagina: Secondary | ICD-10-CM | POA: Diagnosis not present

## 2013-09-16 DIAGNOSIS — N760 Acute vaginitis: Secondary | ICD-10-CM | POA: Diagnosis not present

## 2013-09-24 DIAGNOSIS — N760 Acute vaginitis: Secondary | ICD-10-CM | POA: Diagnosis not present

## 2013-09-24 DIAGNOSIS — Z87828 Personal history of other (healed) physical injury and trauma: Secondary | ICD-10-CM | POA: Insufficient documentation

## 2013-09-24 DIAGNOSIS — R51 Headache: Secondary | ICD-10-CM | POA: Diagnosis not present

## 2013-09-24 DIAGNOSIS — R269 Unspecified abnormalities of gait and mobility: Secondary | ICD-10-CM | POA: Diagnosis not present

## 2013-09-24 DIAGNOSIS — F0781 Postconcussional syndrome: Secondary | ICD-10-CM | POA: Diagnosis not present

## 2013-09-24 DIAGNOSIS — Z8673 Personal history of transient ischemic attack (TIA), and cerebral infarction without residual deficits: Secondary | ICD-10-CM | POA: Insufficient documentation

## 2013-09-24 DIAGNOSIS — N952 Postmenopausal atrophic vaginitis: Secondary | ICD-10-CM | POA: Diagnosis not present

## 2013-10-06 DIAGNOSIS — R279 Unspecified lack of coordination: Secondary | ICD-10-CM | POA: Diagnosis not present

## 2013-10-06 DIAGNOSIS — J309 Allergic rhinitis, unspecified: Secondary | ICD-10-CM | POA: Diagnosis not present

## 2013-10-06 DIAGNOSIS — F0781 Postconcussional syndrome: Secondary | ICD-10-CM | POA: Diagnosis not present

## 2013-10-06 DIAGNOSIS — R030 Elevated blood-pressure reading, without diagnosis of hypertension: Secondary | ICD-10-CM | POA: Diagnosis not present

## 2013-10-07 DIAGNOSIS — N760 Acute vaginitis: Secondary | ICD-10-CM | POA: Diagnosis not present

## 2013-10-21 DIAGNOSIS — R269 Unspecified abnormalities of gait and mobility: Secondary | ICD-10-CM | POA: Diagnosis not present

## 2013-10-21 DIAGNOSIS — F0781 Postconcussional syndrome: Secondary | ICD-10-CM | POA: Diagnosis not present

## 2013-10-21 DIAGNOSIS — R51 Headache: Secondary | ICD-10-CM | POA: Diagnosis not present

## 2013-10-21 DIAGNOSIS — Z87820 Personal history of traumatic brain injury: Secondary | ICD-10-CM | POA: Diagnosis not present

## 2013-10-21 DIAGNOSIS — R519 Headache, unspecified: Secondary | ICD-10-CM | POA: Insufficient documentation

## 2013-11-04 DIAGNOSIS — H43819 Vitreous degeneration, unspecified eye: Secondary | ICD-10-CM | POA: Diagnosis not present

## 2013-11-04 DIAGNOSIS — H43399 Other vitreous opacities, unspecified eye: Secondary | ICD-10-CM | POA: Diagnosis not present

## 2013-11-04 DIAGNOSIS — H04129 Dry eye syndrome of unspecified lacrimal gland: Secondary | ICD-10-CM | POA: Diagnosis not present

## 2013-11-04 DIAGNOSIS — H4011X0 Primary open-angle glaucoma, stage unspecified: Secondary | ICD-10-CM | POA: Diagnosis not present

## 2013-11-04 DIAGNOSIS — H2510 Age-related nuclear cataract, unspecified eye: Secondary | ICD-10-CM | POA: Diagnosis not present

## 2013-11-10 DIAGNOSIS — S060X9A Concussion with loss of consciousness of unspecified duration, initial encounter: Secondary | ICD-10-CM | POA: Diagnosis not present

## 2013-11-10 DIAGNOSIS — S060XAA Concussion with loss of consciousness status unknown, initial encounter: Secondary | ICD-10-CM | POA: Diagnosis not present

## 2013-11-10 DIAGNOSIS — M810 Age-related osteoporosis without current pathological fracture: Secondary | ICD-10-CM | POA: Diagnosis not present

## 2013-11-10 DIAGNOSIS — J309 Allergic rhinitis, unspecified: Secondary | ICD-10-CM | POA: Diagnosis not present

## 2013-11-10 DIAGNOSIS — F0781 Postconcussional syndrome: Secondary | ICD-10-CM | POA: Diagnosis not present

## 2013-12-16 DIAGNOSIS — M810 Age-related osteoporosis without current pathological fracture: Secondary | ICD-10-CM | POA: Diagnosis not present

## 2013-12-26 DIAGNOSIS — Z23 Encounter for immunization: Secondary | ICD-10-CM | POA: Diagnosis not present

## 2013-12-31 DIAGNOSIS — N898 Other specified noninflammatory disorders of vagina: Secondary | ICD-10-CM | POA: Diagnosis not present

## 2014-01-16 DIAGNOSIS — R739 Hyperglycemia, unspecified: Secondary | ICD-10-CM | POA: Diagnosis not present

## 2014-01-16 DIAGNOSIS — L850 Acquired ichthyosis: Secondary | ICD-10-CM | POA: Diagnosis not present

## 2014-01-16 DIAGNOSIS — N950 Postmenopausal bleeding: Secondary | ICD-10-CM | POA: Diagnosis not present

## 2014-01-30 ENCOUNTER — Ambulatory Visit: Payer: Self-pay | Admitting: Family Medicine

## 2014-01-30 DIAGNOSIS — N950 Postmenopausal bleeding: Secondary | ICD-10-CM | POA: Diagnosis not present

## 2014-01-30 DIAGNOSIS — I6522 Occlusion and stenosis of left carotid artery: Secondary | ICD-10-CM | POA: Diagnosis not present

## 2014-01-30 DIAGNOSIS — M542 Cervicalgia: Secondary | ICD-10-CM | POA: Diagnosis not present

## 2014-01-30 DIAGNOSIS — M8580 Other specified disorders of bone density and structure, unspecified site: Secondary | ICD-10-CM | POA: Diagnosis not present

## 2014-01-30 DIAGNOSIS — J069 Acute upper respiratory infection, unspecified: Secondary | ICD-10-CM | POA: Diagnosis not present

## 2014-02-10 DIAGNOSIS — B373 Candidiasis of vulva and vagina: Secondary | ICD-10-CM | POA: Diagnosis not present

## 2014-02-21 DIAGNOSIS — B029 Zoster without complications: Secondary | ICD-10-CM | POA: Diagnosis not present

## 2014-02-26 DIAGNOSIS — J019 Acute sinusitis, unspecified: Secondary | ICD-10-CM | POA: Diagnosis not present

## 2014-02-26 DIAGNOSIS — N950 Postmenopausal bleeding: Secondary | ICD-10-CM | POA: Diagnosis not present

## 2014-02-26 DIAGNOSIS — B029 Zoster without complications: Secondary | ICD-10-CM | POA: Diagnosis not present

## 2014-03-09 DIAGNOSIS — Z0000 Encounter for general adult medical examination without abnormal findings: Secondary | ICD-10-CM | POA: Diagnosis not present

## 2014-03-09 DIAGNOSIS — E780 Pure hypercholesterolemia: Secondary | ICD-10-CM | POA: Diagnosis not present

## 2014-03-09 DIAGNOSIS — B029 Zoster without complications: Secondary | ICD-10-CM | POA: Diagnosis not present

## 2014-03-10 DIAGNOSIS — E780 Pure hypercholesterolemia: Secondary | ICD-10-CM | POA: Diagnosis not present

## 2014-03-10 LAB — LIPID PANEL
Cholesterol: 254 mg/dL — AB (ref 0–200)
HDL: 58 mg/dL (ref 35–70)
LDL Cholesterol: 160 mg/dL
Triglycerides: 180 mg/dL — AB (ref 40–160)

## 2014-03-10 LAB — CBC AND DIFFERENTIAL
HCT: 43 % (ref 36–46)
Hemoglobin: 14.7 g/dL (ref 12.0–16.0)
Neutrophils Absolute: 42 /uL
Platelets: 306 10*3/uL (ref 150–399)
WBC: 7.6 10^3/mL

## 2014-03-26 DIAGNOSIS — L739 Follicular disorder, unspecified: Secondary | ICD-10-CM | POA: Diagnosis not present

## 2014-03-26 DIAGNOSIS — B029 Zoster without complications: Secondary | ICD-10-CM | POA: Diagnosis not present

## 2014-03-26 DIAGNOSIS — Z1389 Encounter for screening for other disorder: Secondary | ICD-10-CM | POA: Diagnosis not present

## 2014-03-26 DIAGNOSIS — N952 Postmenopausal atrophic vaginitis: Secondary | ICD-10-CM | POA: Diagnosis not present

## 2014-04-03 DIAGNOSIS — N898 Other specified noninflammatory disorders of vagina: Secondary | ICD-10-CM | POA: Diagnosis not present

## 2014-04-03 DIAGNOSIS — N9489 Other specified conditions associated with female genital organs and menstrual cycle: Secondary | ICD-10-CM | POA: Diagnosis not present

## 2014-04-03 DIAGNOSIS — L900 Lichen sclerosus et atrophicus: Secondary | ICD-10-CM | POA: Diagnosis not present

## 2014-04-20 DIAGNOSIS — B0223 Postherpetic polyneuropathy: Secondary | ICD-10-CM | POA: Diagnosis not present

## 2014-04-20 DIAGNOSIS — N904 Leukoplakia of vulva: Secondary | ICD-10-CM | POA: Diagnosis not present

## 2014-05-12 DIAGNOSIS — L853 Xerosis cutis: Secondary | ICD-10-CM | POA: Diagnosis not present

## 2014-05-12 DIAGNOSIS — R202 Paresthesia of skin: Secondary | ICD-10-CM | POA: Diagnosis not present

## 2014-05-12 DIAGNOSIS — L900 Lichen sclerosus et atrophicus: Secondary | ICD-10-CM | POA: Diagnosis not present

## 2014-05-12 DIAGNOSIS — R739 Hyperglycemia, unspecified: Secondary | ICD-10-CM | POA: Diagnosis not present

## 2014-05-14 DIAGNOSIS — R739 Hyperglycemia, unspecified: Secondary | ICD-10-CM | POA: Diagnosis not present

## 2014-05-14 DIAGNOSIS — R202 Paresthesia of skin: Secondary | ICD-10-CM | POA: Diagnosis not present

## 2014-07-07 DIAGNOSIS — H4011X4 Primary open-angle glaucoma, indeterminate stage: Secondary | ICD-10-CM | POA: Diagnosis not present

## 2014-07-21 DIAGNOSIS — N952 Postmenopausal atrophic vaginitis: Secondary | ICD-10-CM | POA: Diagnosis not present

## 2014-07-21 DIAGNOSIS — L900 Lichen sclerosus et atrophicus: Secondary | ICD-10-CM | POA: Diagnosis not present

## 2014-07-28 DIAGNOSIS — B9689 Other specified bacterial agents as the cause of diseases classified elsewhere: Secondary | ICD-10-CM | POA: Diagnosis not present

## 2014-07-28 DIAGNOSIS — J028 Acute pharyngitis due to other specified organisms: Secondary | ICD-10-CM | POA: Diagnosis not present

## 2014-07-28 DIAGNOSIS — J069 Acute upper respiratory infection, unspecified: Secondary | ICD-10-CM | POA: Diagnosis not present

## 2014-07-31 ENCOUNTER — Ambulatory Visit: Payer: Self-pay | Admitting: Physician Assistant

## 2014-07-31 DIAGNOSIS — K581 Irritable bowel syndrome with constipation: Secondary | ICD-10-CM | POA: Insufficient documentation

## 2014-07-31 DIAGNOSIS — M7989 Other specified soft tissue disorders: Secondary | ICD-10-CM | POA: Insufficient documentation

## 2014-07-31 DIAGNOSIS — R030 Elevated blood-pressure reading, without diagnosis of hypertension: Secondary | ICD-10-CM | POA: Insufficient documentation

## 2014-07-31 DIAGNOSIS — K649 Unspecified hemorrhoids: Secondary | ICD-10-CM | POA: Insufficient documentation

## 2014-07-31 DIAGNOSIS — R918 Other nonspecific abnormal finding of lung field: Secondary | ICD-10-CM | POA: Insufficient documentation

## 2014-07-31 DIAGNOSIS — R5383 Other fatigue: Secondary | ICD-10-CM | POA: Insufficient documentation

## 2014-07-31 DIAGNOSIS — K5900 Constipation, unspecified: Secondary | ICD-10-CM | POA: Insufficient documentation

## 2014-07-31 DIAGNOSIS — F0781 Postconcussional syndrome: Secondary | ICD-10-CM | POA: Insufficient documentation

## 2014-07-31 DIAGNOSIS — D582 Other hemoglobinopathies: Secondary | ICD-10-CM | POA: Insufficient documentation

## 2014-07-31 DIAGNOSIS — R202 Paresthesia of skin: Secondary | ICD-10-CM | POA: Insufficient documentation

## 2014-07-31 DIAGNOSIS — R1011 Right upper quadrant pain: Secondary | ICD-10-CM | POA: Insufficient documentation

## 2014-07-31 DIAGNOSIS — I82409 Acute embolism and thrombosis of unspecified deep veins of unspecified lower extremity: Secondary | ICD-10-CM | POA: Insufficient documentation

## 2014-07-31 DIAGNOSIS — L900 Lichen sclerosus et atrophicus: Secondary | ICD-10-CM | POA: Insufficient documentation

## 2014-07-31 DIAGNOSIS — R0789 Other chest pain: Secondary | ICD-10-CM | POA: Insufficient documentation

## 2014-07-31 DIAGNOSIS — J309 Allergic rhinitis, unspecified: Secondary | ICD-10-CM | POA: Insufficient documentation

## 2014-07-31 DIAGNOSIS — B029 Zoster without complications: Secondary | ICD-10-CM | POA: Insufficient documentation

## 2014-07-31 DIAGNOSIS — S060XAA Concussion with loss of consciousness status unknown, initial encounter: Secondary | ICD-10-CM | POA: Insufficient documentation

## 2014-07-31 DIAGNOSIS — E782 Mixed hyperlipidemia: Secondary | ICD-10-CM | POA: Insufficient documentation

## 2014-07-31 DIAGNOSIS — E785 Hyperlipidemia, unspecified: Secondary | ICD-10-CM | POA: Insufficient documentation

## 2014-07-31 DIAGNOSIS — G4730 Sleep apnea, unspecified: Secondary | ICD-10-CM | POA: Insufficient documentation

## 2014-07-31 DIAGNOSIS — N952 Postmenopausal atrophic vaginitis: Secondary | ICD-10-CM | POA: Insufficient documentation

## 2014-07-31 DIAGNOSIS — K449 Diaphragmatic hernia without obstruction or gangrene: Secondary | ICD-10-CM | POA: Insufficient documentation

## 2014-07-31 DIAGNOSIS — R270 Ataxia, unspecified: Secondary | ICD-10-CM | POA: Insufficient documentation

## 2014-07-31 DIAGNOSIS — K7581 Nonalcoholic steatohepatitis (NASH): Secondary | ICD-10-CM | POA: Insufficient documentation

## 2014-07-31 DIAGNOSIS — E559 Vitamin D deficiency, unspecified: Secondary | ICD-10-CM | POA: Insufficient documentation

## 2014-07-31 DIAGNOSIS — R079 Chest pain, unspecified: Secondary | ICD-10-CM | POA: Insufficient documentation

## 2014-07-31 DIAGNOSIS — R739 Hyperglycemia, unspecified: Secondary | ICD-10-CM | POA: Insufficient documentation

## 2014-07-31 DIAGNOSIS — G43909 Migraine, unspecified, not intractable, without status migrainosus: Secondary | ICD-10-CM | POA: Insufficient documentation

## 2014-07-31 DIAGNOSIS — S060X9A Concussion with loss of consciousness of unspecified duration, initial encounter: Secondary | ICD-10-CM | POA: Insufficient documentation

## 2014-07-31 DIAGNOSIS — Q394 Esophageal web: Secondary | ICD-10-CM | POA: Insufficient documentation

## 2014-08-03 ENCOUNTER — Other Ambulatory Visit: Payer: Self-pay

## 2014-08-03 ENCOUNTER — Encounter: Payer: Self-pay | Admitting: Physician Assistant

## 2014-08-03 ENCOUNTER — Ambulatory Visit (INDEPENDENT_AMBULATORY_CARE_PROVIDER_SITE_OTHER): Payer: Medicare Other | Admitting: Physician Assistant

## 2014-08-03 VITALS — BP 118/77 | HR 64 | Temp 98.7°F | Resp 16 | Wt 175.8 lb

## 2014-08-03 DIAGNOSIS — B370 Candidal stomatitis: Secondary | ICD-10-CM | POA: Diagnosis not present

## 2014-08-03 DIAGNOSIS — B9689 Other specified bacterial agents as the cause of diseases classified elsewhere: Secondary | ICD-10-CM

## 2014-08-03 DIAGNOSIS — J0100 Acute maxillary sinusitis, unspecified: Secondary | ICD-10-CM

## 2014-08-03 DIAGNOSIS — I639 Cerebral infarction, unspecified: Secondary | ICD-10-CM | POA: Insufficient documentation

## 2014-08-03 DIAGNOSIS — Z1389 Encounter for screening for other disorder: Secondary | ICD-10-CM

## 2014-08-03 DIAGNOSIS — J029 Acute pharyngitis, unspecified: Secondary | ICD-10-CM | POA: Diagnosis not present

## 2014-08-03 DIAGNOSIS — K449 Diaphragmatic hernia without obstruction or gangrene: Secondary | ICD-10-CM | POA: Insufficient documentation

## 2014-08-03 DIAGNOSIS — K219 Gastro-esophageal reflux disease without esophagitis: Secondary | ICD-10-CM | POA: Insufficient documentation

## 2014-08-03 DIAGNOSIS — J028 Acute pharyngitis due to other specified organisms: Principal | ICD-10-CM

## 2014-08-03 MED ORDER — AZITHROMYCIN 250 MG PO TABS: ORAL_TABLET | ORAL | Status: DC

## 2014-08-03 MED ORDER — FLUCONAZOLE 150 MG PO TABS: 150.0000 mg | ORAL_TABLET | Freq: Once | ORAL | Status: DC

## 2014-08-03 MED ORDER — HYDROCODONE-HOMATROPINE 5-1.5 MG/5ML PO SYRP: 5.0000 mL | ORAL_SOLUTION | Freq: Four times a day (QID) | ORAL | Status: DC | PRN

## 2014-08-03 NOTE — Patient Instructions (Signed)

## 2014-08-03 NOTE — Progress Notes (Signed)
Subjective:    Patient ID: Virginia Phillips, female    DOB: March 13, 1945, 69 y.o.   MRN: [id number]  Sore Throat  This is a new problem. The current episode started 1 to 4 weeks ago (07/25/14). The problem has been gradually improving. The pain is worse on the right side. The maximum temperature recorded prior to her arrival was 101 - 101.9 F. The fever has been present for less than 1 day. The pain is moderate. Associated symptoms include congestion, coughing, headaches, neck pain, swollen glands and trouble swallowing. Pertinent negatives include no abdominal pain, diarrhea, drooling, ear discharge, ear pain, hoarse voice, plugged ear sensation, shortness of breath, stridor or vomiting. She has had no exposure to strep or mono. She has tried gargles and NSAIDs for the symptoms. The treatment provided mild relief.      Review of Systems  Constitutional: Positive for fever. Negative for chills and fatigue.  HENT: Positive for congestion, postnasal drip, rhinorrhea, sinus pressure, sneezing, sore throat and trouble swallowing. Negative for drooling, ear discharge, ear pain, hoarse voice, mouth sores and nosebleeds.   Respiratory: Positive for cough. Negative for shortness of breath and stridor.   Cardiovascular: Negative for chest pain.  Gastrointestinal: Negative for vomiting, abdominal pain and diarrhea.  Musculoskeletal: Positive for neck pain.  Allergic/Immunologic: Positive for environmental allergies.  Neurological: Positive for headaches. Negative for weakness.       Objective:   Physical Exam  Constitutional: She appears well-developed and well-nourished. No distress.  HENT:  Head: Normocephalic and atraumatic.  Right Ear: Hearing, tympanic membrane, external ear and ear canal normal.  Left Ear: Hearing, tympanic membrane, external ear and ear canal normal.  Nose: Mucosal edema and rhinorrhea present. Right sinus exhibits maxillary sinus tenderness. Right sinus exhibits no frontal  sinus tenderness. Left sinus exhibits maxillary sinus tenderness.  Mouth/Throat: Uvula is midline and mucous membranes are normal. No uvula swelling. Posterior oropharyngeal edema and posterior oropharyngeal erythema present. No oropharyngeal exudate or tonsillar abscesses.  Eyes: Conjunctivae are normal. Pupils are equal, round, and reactive to light. Right eye exhibits no discharge. Left eye exhibits no discharge.  Neck: Normal range of motion. Neck supple. No tracheal deviation present. No thyromegaly present.  Cardiovascular: Normal rate, regular rhythm and normal heart sounds.  Exam reveals no gallop and no friction rub.   No murmur heard. Pulmonary/Chest: Effort normal and breath sounds normal. No stridor. No respiratory distress. She has no wheezes. She has no rales. She exhibits no tenderness.  Lymphadenopathy:    She has cervical adenopathy (on right side).          Assessment & Plan:  1. Acute bacterial pharyngitis Gradually improving but would like to try one more round of zpak to see if it completely clears.  Will refill hycodan cough syrup for nighttime cough that is worsened by lying down.  She is to call the office if symptoms fail to improve.  - azithromycin (ZITHROMAX) 250 MG tablet; Take 2 tablets PO on day one, and one tablet PO daily thereafter until completed.  Dispense: 6 tablet; Refill: 0 - HYDROcodone-homatropine (HYCODAN) 5-1.5 MG/5ML syrup; Take 5 mLs by mouth every 6 (six) hours as needed for cough.  Dispense: 120 mL; Refill: 0  2. Acute maxillary sinusitis, recurrence not specified Treatment with zpak.  Advised to continue allergy medications (certirizine and nasal spray) for better control of seasonal allergies.  She is to call the office if symptoms fail to improve.  3. Thrush Diflucan given as  prophylaxis as she gets thrush of the tongue with antibiotic treatment.  Can not use [facility] magic mouthwash as she gets allergic reaction.  - fluconazole (DIFLUCAN)  150 MG tablet; Take 1 tablet (150 mg total) by mouth once.  Dispense: 3 tablet; Refill: 0

## 2014-08-19 ENCOUNTER — Telehealth: Payer: Self-pay | Admitting: Family Medicine

## 2014-08-19 DIAGNOSIS — R110 Nausea: Secondary | ICD-10-CM

## 2014-08-19 MED ORDER — ONDANSETRON HCL 4 MG PO TABS: 4.0000 mg | ORAL_TABLET | Freq: Three times a day (TID) | ORAL | Status: DC | PRN

## 2014-08-19 NOTE — Telephone Encounter (Signed)
Pt called back and stated she spoke with Gerald Stabs at [facility] and he advised pt that she had taken Zofan before and he would send a refill request to our office. Thanks TNP

## 2014-08-19 NOTE — Telephone Encounter (Signed)
Please notify patient zofran Rx sent to [facility].  Thanks! -JB

## 2014-08-19 NOTE — Telephone Encounter (Signed)
This is a pt of Dr. Venia Minks. Pt stated she was exposed to vomiting and diarrhea virus over the weekend and she is starting to feel sick. 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 Pt would like something sent in to [place] to help her from heaving. Pt stated that Dr. Venia Minks have giving her a medication before but can not remember the name of it. Thanks TNP

## 2014-08-19 NOTE — Telephone Encounter (Signed)
Patient advised as directed below. 

## 2014-10-30 DIAGNOSIS — H4011X4 Primary open-angle glaucoma, indeterminate stage: Secondary | ICD-10-CM | POA: Diagnosis not present

## 2014-12-25 ENCOUNTER — Telehealth: Payer: Self-pay | Admitting: Family Medicine

## 2014-12-25 ENCOUNTER — Encounter: Payer: Self-pay | Admitting: Family Medicine

## 2014-12-25 ENCOUNTER — Ambulatory Visit (INDEPENDENT_AMBULATORY_CARE_PROVIDER_SITE_OTHER): Payer: Medicare Other | Admitting: Family Medicine

## 2014-12-25 VITALS — BP 110/68 | HR 74 | Temp 97.7°F | Resp 20 | Ht 65.0 in | Wt 178.0 lb

## 2014-12-25 DIAGNOSIS — I639 Cerebral infarction, unspecified: Secondary | ICD-10-CM | POA: Insufficient documentation

## 2014-12-25 DIAGNOSIS — B029 Zoster without complications: Secondary | ICD-10-CM

## 2014-12-25 MED ORDER — ACYCLOVIR 800 MG PO TABS: 800.0000 mg | ORAL_TABLET | Freq: Every day | ORAL | Status: DC

## 2014-12-25 MED ORDER — VALACYCLOVIR HCL 1 G PO TABS: 1000.0000 mg | ORAL_TABLET | Freq: Three times a day (TID) | ORAL | Status: DC

## 2014-12-25 NOTE — Telephone Encounter (Signed)
Pt states she was just in today and rec'd a Rx for valACYclovir (VALTREX) 1000 MG.  Pt states this medication contains blue/green dye.  Pt states she can not take any medication with a blue or green dye in it.  Pt is asking if a different Rx can be given.  Pt is requesting to pick up the new Rx and take it to the pharmacy herself.  CB#[phone]/MW

## 2014-12-25 NOTE — Progress Notes (Signed)
Patient ID: Virginia Phillips, female   DOB: [birth year], 69 y.o.   MRN: [id number]        Patient: Virginia Phillips Female    DOB: 02-Jan-1946   69 y.o.   MRN: [id number] Visit Date: 12/25/2014  Today's Provider: Margarita Rana, MD   Chief Complaint  Patient presents with  . Rash   Subjective:    HPI Rash: Patient complains of rash involving the right leg. Rash started a few days ago. Appearance of rash at onset: Color of lesion(s): brown, red. Rash pt not sure if it has changed over time Initial distribution: none.  Discomfort associated with rash: is pruritic.  Associated symptoms: burning. Denies: fever. Patient has had previous evaluation of rash. Patient has had previous treatment.  Response to treatment: good. Patient has had contacts with similar rash. Patient has identified precipitant. Patient has not had new exposures (soaps, lotions, laundry detergents, foods, medications, plants, insects or animals.) Patient reports that she may be getting shingles again.     Allergies  Allergen Reactions  . Amoxicillin Other (See Comments)    Sleepy  . Baby Oil   . Blue Dyes (Parenteral)   . Cortisone     Face purple  . Diphenhydramine Other (See Comments)  . Doxycycline Other (See Comments)  . Esomeprazole Other (See Comments)  . Iodinated Diagnostic Agents Other (See Comments)  . Nortriptyline Hcl     Migraine  . Other Other (See Comments)  . Tetracycline Other (See Comments)  . Levofloxacin Rash  . Prednisone Rash and Swelling  . Sulfa Antibiotics Rash   Previous Medications   ASPIRIN 81 MG TABLET    Take 1 tablet by mouth daily.   B COMPLEX VITAMINS PO    Take 1 tablet by mouth 2 (two) times daily.   BIOFLAVONOID PRODUCTS (VITAMIN C PLUS) 500 MG TABS    Take 1 tablet by mouth 2 (two) times daily.   BIOTIN 1000 MCG TABLET    Take 1 tablet by mouth 2 (two) times daily.   CARBOXYMETHYLCELLULOSE (REFRESH PLUS) 0.5 % SOLN    Apply to eye.   CHLORPHENIRAMINE-PSE-IBUPROFEN 2-30-200 MG  TABS    Take by mouth.   CHOLECALCIFEROL (VITAMIN D) 2000 UNITS CAPS    Take 4 capsules by mouth daily.   CHROMIUM PICOLINATE 1000 MCG TABS    Take 1 tablet by mouth daily.   COENZYME Q10 (CO Q10) 200 MG CAPS    Take 1 capsule by mouth 2 (two) times daily.   CYCLOSPORINE (RESTASIS) 0.05 % OPHTHALMIC EMULSION    Apply to eye 2 (two) times daily.   FLUCONAZOLE (DIFLUCAN) 150 MG TABLET    Take 1 tablet (150 mg total) by mouth once.   HYDROCODONE-HOMATROPINE (HYCODAN) 5-1.5 MG/5ML SYRUP    Take 5 mLs by mouth every 6 (six) hours as needed for cough.   MULTIPLE VITAMIN PO    Take 1 tablet by mouth daily.   OMEGA-3 FATTY ACIDS (FISH OIL) 1000 MG CAPS    Take 1 capsule by mouth daily.   ONDANSETRON (ZOFRAN) 4 MG TABLET    Take 1 tablet (4 mg total) by mouth every 8 (eight) hours as needed for nausea or vomiting.   PATADAY 0.2 % SOLN       PRAVASTATIN (PRAVACHOL) 10 MG TABLET    Take 1 tablet by mouth at bedtime.   RIBOFLAVIN (VITAMIN B-2) 100 MG TABS TABLET    Take 1 tablet by mouth daily.   TRIAMCINOLONE CREAM (  KENALOG) 0.1 %    TRIAMCINOLONE ACETONIDE, 0.1% (External Cream)  1 Cream apply to affected area as needed for 0 days  Quantity: 45;  Refills: 1   Ordered :12-May-2014  Margarita Rana MD;  Started 12-May-2014 Active Comments: Medication taken as needed.   VENLAFAXINE (EFFEXOR) 75 MG TABLET    Take 1 tablet by mouth daily.    Review of Systems  Constitutional: Negative.   Skin: Positive for rash.    Social History  Substance Use Topics  . Smoking status: Never Smoker   . Smokeless tobacco: Not on file  . Alcohol Use: No   Objective:   BP 110/68 mmHg  Pulse 74  Temp(Src) 97.7 F (36.5 C) (Oral)  Resp 20  Ht 5' 5"  (1.651 m)  Wt 178 lb (80.74 kg)  BMI 29.62 kg/m2  Physical Exam  Constitutional: She is oriented to person, place, and time. She appears well-developed and well-nourished.  Musculoskeletal: Normal range of motion.  Neurological: She is alert and oriented to person,  place, and time.  Skin:  Scars on right buttocks and legs noted.       Assessment & Plan:     1. Shingles New problem. Possible recurrence. Unclear at this point. Will treat and call if worsens or changes or does not improve.  - valACYclovir (VALTREX) 1000 MG tablet; Take 1 tablet (1,000 mg total) by mouth 3 (three) times daily.  Dispense: 21 tablet; Refill: 0       Margarita Rana, MD  [facility]

## 2014-12-25 NOTE — Telephone Encounter (Signed)
Will have to check with pharmacist and we can change to whatever they recommend. Wrote for same medication we prescribed last time. Thanks.

## 2015-01-14 DIAGNOSIS — Z23 Encounter for immunization: Secondary | ICD-10-CM | POA: Diagnosis not present

## 2015-02-09 DIAGNOSIS — H401123 Primary open-angle glaucoma, left eye, severe stage: Secondary | ICD-10-CM | POA: Diagnosis not present

## 2015-02-26 ENCOUNTER — Other Ambulatory Visit: Payer: Self-pay | Admitting: Family Medicine

## 2015-02-26 DIAGNOSIS — E785 Hyperlipidemia, unspecified: Secondary | ICD-10-CM

## 2015-02-26 MED ORDER — PRAVASTATIN SODIUM 10 MG PO TABS: 10.0000 mg | ORAL_TABLET | Freq: Every day | ORAL | Status: DC

## 2015-02-26 NOTE — Telephone Encounter (Signed)
Needs CPE after January 11. Renaldo Fiddler, CMA

## 2015-02-26 NOTE — Telephone Encounter (Signed)
Pt contacted office for refill request on the following medications:  pravastatin (PRAVACHOL) 10 MG tablet.  90 day supply.  [employer].  [phone]

## 2015-05-25 DIAGNOSIS — H401123 Primary open-angle glaucoma, left eye, severe stage: Secondary | ICD-10-CM | POA: Diagnosis not present

## 2015-06-02 ENCOUNTER — Ambulatory Visit (INDEPENDENT_AMBULATORY_CARE_PROVIDER_SITE_OTHER): Payer: Medicare Other | Admitting: Family Medicine

## 2015-06-02 ENCOUNTER — Encounter: Payer: Self-pay | Admitting: Family Medicine

## 2015-06-02 VITALS — BP 132/80 | HR 72 | Temp 97.7°F | Resp 16 | Wt 179.0 lb

## 2015-06-02 DIAGNOSIS — R739 Hyperglycemia, unspecified: Secondary | ICD-10-CM

## 2015-06-02 DIAGNOSIS — E785 Hyperlipidemia, unspecified: Secondary | ICD-10-CM

## 2015-06-02 DIAGNOSIS — E559 Vitamin D deficiency, unspecified: Secondary | ICD-10-CM

## 2015-06-02 DIAGNOSIS — K219 Gastro-esophageal reflux disease without esophagitis: Secondary | ICD-10-CM | POA: Diagnosis not present

## 2015-06-02 NOTE — Progress Notes (Signed)
Subjective:    Patient ID: Virginia Phillips, female    DOB: 1945/04/22, 70 y.o.   MRN: [id number]  Abdominal Pain This is a new problem. The current episode started 1 to 4 weeks ago. The problem occurs intermittently. The pain is located in the RUQ. The pain is severe. The quality of the pain is sharp. The abdominal pain does not radiate. Associated symptoms include belching, headaches and nausea. Pertinent negatives include no anorexia, constipation, diarrhea, dysuria, fever, flatus, frequency, hematochezia, hematuria, melena, vomiting or weight loss. Nothing aggravates the pain. The pain is relieved by nothing. She has tried nothing for the symptoms.   Also needs her labs done.   Chronic problems are stable. Following up for Wellness already scheduled.    Review of Systems  Constitutional: Negative for fever and weight loss.  Gastrointestinal: Positive for nausea and abdominal pain. Negative for vomiting, diarrhea, constipation, melena, hematochezia, anorexia and flatus.  Genitourinary: Negative for dysuria, frequency and hematuria.  Neurological: Positive for headaches.   BP 132/80 mmHg  Pulse 72  Temp(Src) 97.7 F (36.5 C) (Oral)  Resp 16  Wt 179 lb (81.194 kg)   Patient Active Problem List   Diagnosis Date Noted  . Cerebrovascular accident (CVA) ([place]) 12/25/2014  . Shingles 12/25/2014  . Acid reflux 08/03/2014  . 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 Cerebral vascular accident ([place]) 08/03/2014  . Allergic rhinitis 07/31/2014  . 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 Atrophic vaginitis 07/31/2014  . Atypical chest pain 07/31/2014  . Blood pressure elevated without history of HTN 07/31/2014  . Chest pain 07/31/2014  . CN (constipation) 07/31/2014  . Deep vein thrombosis ([place]) 07/31/2014  . Concussion injury of body structure 07/31/2014  . Elevated hemoglobin ([place]) 07/31/2014  . Fatigue 07/31/2014  . Hemorrhoid 07/31/2014  . 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 Calcium blood  increased 07/31/2014  . Hyperlipidemia 07/31/2014  . Blood glucose elevated 07/31/2014  . Leg swelling 07/31/2014  . Lichen sclerosus 33/29/5188  . NASH (nonalcoholic steatohepatitis) 07/31/2014  . Headache, migraine 07/31/2014  . Burning or prickling sensation 07/31/2014  . 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 Lung mass 07/31/2014  . Abdominal pain, right upper quadrant 07/31/2014  . 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 Apnea, sleep 07/31/2014  . Avitaminosis D 07/31/2014  . Cephalalgia 10/21/2013  . Abnormal gait 09/24/2013  . H/O head injury 09/24/2013  . Temporary cerebral vascular dysfunction 11/15/1996   No past medical history on file. Current Outpatient Prescriptions on File Prior to Visit  Medication Sig  . aspirin 81 MG tablet Take 1 tablet by mouth daily.  . B COMPLEX VITAMINS PO Take 1 tablet by mouth 2 (two) times daily.  Marland Kitchen Bioflavonoid Products (VITAMIN C PLUS) 500 MG TABS Take 1 tablet by mouth 2 (two) times daily.  . Chlorpheniramine-PSE-Ibuprofen 2-30-200 MG TABS Take by mouth.  . Cholecalciferol (VITAMIN D) 2000 UNITS CAPS Take 4 capsules by mouth daily.  . Coenzyme Q10 (CO Q10) 200 MG CAPS Take 1 capsule by mouth 2 (two) times daily.  . cycloSPORINE (RESTASIS) 0.05 % ophthalmic emulsion Apply to eye 2 (two) times daily.  . MULTIPLE VITAMIN PO Take 1 tablet by mouth daily.  . pravastatin (PRAVACHOL) 10 MG tablet Take 1 tablet (10 mg total) by mouth at bedtime.  . riboflavin (VITAMIN B-2) 100 MG TABS tablet Take 1 tablet by mouth daily.  Marland Kitchen triamcinolone cream (KENALOG) 0.1 % TRIAMCINOLONE ACETONIDE, 0.1% (External Cream)  1 Cream apply to affected  area as needed for 0 days  Quantity: 45;  Refills: 1   Ordered :12-May-2014  Margarita Rana MD;  Started 12-May-2014 Active Comments: Medication taken as needed.  . venlafaxine (EFFEXOR) 75 MG tablet Take 1 tablet by mouth daily.  . Chromium Picolinate 1000 MCG TABS Take 1 tablet by mouth  daily. Reported on 06/02/2015   No current facility-administered medications on file prior to visit.   Allergies  Allergen Reactions  . Amoxicillin Other (See Comments)    Sleepy  . Baby Oil   . Blue Dyes (Parenteral)   . Cortisone     Face purple  . Diphenhydramine Other (See Comments)  . Doxycycline Other (See Comments)  . Esomeprazole Other (See Comments)  . Iodinated Diagnostic Agents Other (See Comments)  . Nortriptyline Hcl     Migraine  . Other Other (See Comments)  . Sulfamethoxazole-Trimethoprim Other (See Comments)  . Tetracycline Other (See Comments)  . Levofloxacin Rash  . Prednisone Rash and Swelling  . Sulfa Antibiotics Rash   Past Surgical History  Procedure Laterality Date  . Knee surgery  08/05/2007  . Knee arthroscopy w/ acl reconstruction Right 2008  . Ankle fracture surgery  2007  . Ganglion cyst excision Right     wrist  . Nissen fundoplication  5176  . Lasik     Social History   Social History  . Marital Status: Married    Spouse Name: N/A  . Number of Children: N/A  . Years of Education: N/A   Occupational History  . Not on file.   Social History Main Topics  . Smoking status: Never Smoker   . Smokeless tobacco: Not on file  . Alcohol Use: No  . Drug Use: No  . Sexual Activity: Not on file   Other Topics Concern  . Not on file   Social History Narrative   Family History  Problem Relation Age of Onset  . Congestive Heart Failure Mother   . Parkinson's disease Mother   . Diabetes Father   . Scleroderma Sister   . Cancer Brother        Objective:   Physical Exam  Constitutional: She is oriented to person, place, and time. She appears well-developed and well-nourished.  Cardiovascular: Normal rate and regular rhythm.   Pulmonary/Chest: Effort normal and breath sounds normal.  Abdominal: Soft. Bowel sounds are normal. There is tenderness (in right upper quadrant. Mild.  ). There is no rebound and no guarding.  Neurological: She  is alert and oriented to person, place, and time.   BP 132/80 mmHg  Pulse 72  Temp(Src) 97.7 F (36.5 C) (Oral)  Resp 16  Wt 179 lb (81.194 kg)     Assessment & Plan:  1. Hyperlipidemia Stable. Check labs.   - CBC with Differential/Platelet - Lipid panel - TSH  2. Blood glucose elevated Stable. Check labs. Follow up for Wellness as scheduled.   - Comprehensive metabolic panel - Hemoglobin A1c  3. Avitaminosis D Check labs.  - VITAMIN D 25 Hydroxy (Vit-D Deficiency, Fractures)   4. Gastroesophageal reflux disease without esophagitis Unclear if cause of abdominal pain. Has improved on it's own. Some mild tenderness. Will monitor and follow up if recurs.   Patient was seen and examined by Jerrell Belfast, MD, and note scribed by Renaldo Fiddler, CMA. I have reviewed the document for accuracy and completeness and I agree with above. Jerrell Belfast, MD   Margarita Rana, MD

## 2015-06-03 LAB — HEMOGLOBIN A1C
Est. average glucose Bld gHb Est-mCnc: 126 mg/dL
Hgb A1c MFr Bld: 6 % — ABNORMAL HIGH (ref 4.8–5.6)

## 2015-06-03 LAB — CBC WITH DIFFERENTIAL/PLATELET
Basophils Absolute: 0 10*3/uL (ref 0.0–0.2)
Basos: 1 %
EOS (ABSOLUTE): 0.1 10*3/uL (ref 0.0–0.4)
Eos: 1 %
Hematocrit: 44.4 % (ref 34.0–46.6)
Hemoglobin: 14.7 g/dL (ref 11.1–15.9)
Immature Grans (Abs): 0 10*3/uL (ref 0.0–0.1)
Immature Granulocytes: 0 %
Lymphocytes Absolute: 3.7 10*3/uL — ABNORMAL HIGH (ref 0.7–3.1)
Lymphs: 44 %
MCH: 31.2 pg (ref 26.6–33.0)
MCHC: 33.1 g/dL (ref 31.5–35.7)
MCV: 94 fL (ref 79–97)
Monocytes Absolute: 0.6 10*3/uL (ref 0.1–0.9)
Monocytes: 8 %
Neutrophils Absolute: 4 10*3/uL (ref 1.4–7.0)
Neutrophils: 46 %
Platelets: 254 10*3/uL (ref 150–379)
RBC: 4.71 x10E6/uL (ref 3.77–5.28)
RDW: 12.7 % (ref 12.3–15.4)
WBC: 8.5 10*3/uL (ref 3.4–10.8)

## 2015-06-03 LAB — COMPREHENSIVE METABOLIC PANEL
ALT: 26 IU/L (ref 0–32)
AST: 23 IU/L (ref 0–40)
Albumin/Globulin Ratio: 2 (ref 1.2–2.2)
Albumin: 4.5 g/dL (ref 3.6–4.8)
Alkaline Phosphatase: 61 IU/L (ref 39–117)
BUN/Creatinine Ratio: 16 (ref 12–28)
BUN: 14 mg/dL (ref 8–27)
Bilirubin Total: 0.3 mg/dL (ref 0.0–1.2)
CO2: 26 mmol/L (ref 18–29)
Calcium: 9.9 mg/dL (ref 8.7–10.3)
Chloride: 100 mmol/L (ref 96–106)
Creatinine, Ser: 0.85 mg/dL (ref 0.57–1.00)
GFR calc Af Amer: 81 mL/min/{1.73_m2} (ref >59)
GFR calc non Af Amer: 70 mL/min/{1.73_m2} (ref >59)
Globulin, Total: 2.3 g/dL (ref 1.5–4.5)
Glucose: 98 mg/dL (ref 65–99)
Potassium: 5 mmol/L (ref 3.5–5.2)
Sodium: 141 mmol/L (ref 134–144)
Total Protein: 6.8 g/dL (ref 6.0–8.5)

## 2015-06-03 LAB — LIPID PANEL
Chol/HDL Ratio: 2.7 ratio units (ref 0.0–4.4)
Cholesterol, Total: 206 mg/dL — ABNORMAL HIGH (ref 100–199)
HDL: 77 mg/dL (ref >39)
LDL Calculated: 113 mg/dL — ABNORMAL HIGH (ref 0–99)
Triglycerides: 79 mg/dL (ref 0–149)
VLDL Cholesterol Cal: 16 mg/dL (ref 5–40)

## 2015-06-03 LAB — TSH: TSH: 2.33 u[IU]/mL (ref 0.450–4.500)

## 2015-06-03 LAB — VITAMIN D 25 HYDROXY (VIT D DEFICIENCY, FRACTURES): Vit D, 25-Hydroxy: 38.5 ng/mL (ref 30.0–100.0)

## 2015-06-04 ENCOUNTER — Telehealth: Payer: Self-pay

## 2015-06-04 NOTE — Telephone Encounter (Signed)
-----   Message from Margarita Rana, MD sent at 06/03/2015 10:24 AM EDT ----- Labs essentially normal. Blood sugar at upper end of normal.  Cholestterol stable.  Continue current medication. Eat healthy and exercise and recheck annually.  Thanks.

## 2015-06-04 NOTE — Telephone Encounter (Signed)
Pt advised as directed below.   Thanks,   -Mickel Baas

## 2015-06-08 ENCOUNTER — Ambulatory Visit (INDEPENDENT_AMBULATORY_CARE_PROVIDER_SITE_OTHER): Payer: Medicare Other | Admitting: Family Medicine

## 2015-06-08 ENCOUNTER — Encounter: Payer: Self-pay | Admitting: Family Medicine

## 2015-06-08 VITALS — BP 100/60 | HR 92 | Temp 98.2°F | Resp 16 | Ht 65.0 in | Wt 178.0 lb

## 2015-06-08 DIAGNOSIS — Z23 Encounter for immunization: Secondary | ICD-10-CM

## 2015-06-08 DIAGNOSIS — R030 Elevated blood-pressure reading, without diagnosis of hypertension: Secondary | ICD-10-CM | POA: Diagnosis not present

## 2015-06-08 DIAGNOSIS — R739 Hyperglycemia, unspecified: Secondary | ICD-10-CM

## 2015-06-08 DIAGNOSIS — E785 Hyperlipidemia, unspecified: Secondary | ICD-10-CM

## 2015-06-08 DIAGNOSIS — Z1231 Encounter for screening mammogram for malignant neoplasm of breast: Secondary | ICD-10-CM

## 2015-06-08 DIAGNOSIS — R143 Flatulence: Secondary | ICD-10-CM | POA: Diagnosis not present

## 2015-06-08 DIAGNOSIS — R202 Paresthesia of skin: Secondary | ICD-10-CM | POA: Diagnosis not present

## 2015-06-08 DIAGNOSIS — IMO0001 Reserved for inherently not codable concepts without codable children: Secondary | ICD-10-CM

## 2015-06-08 DIAGNOSIS — Z0000 Encounter for general adult medical examination without abnormal findings: Secondary | ICD-10-CM

## 2015-06-08 DIAGNOSIS — D582 Other hemoglobinopathies: Secondary | ICD-10-CM | POA: Diagnosis not present

## 2015-06-08 NOTE — Patient Instructions (Signed)
Please double up on your Vitamin B-12

## 2015-06-08 NOTE — Progress Notes (Signed)
Patient ID: Virginia Phillips, female   DOB: 1946-01-09, 70 y.o.   MRN: [id number]       Patient: Virginia Phillips, Female    DOB: October 19, 1945, 70 y.o.   MRN: [id number] Visit Date: 06/08/2015  Today's Provider: Margarita Rana, MD   Chief Complaint  Patient presents with  . Medicare Wellness   Subjective:    Annual wellness visit Virginia Phillips is a 70 y.o. female. She feels well. She reports exercising none active with daily activites. She reports she is sleeping fairly well. 03/09/14 CPE 04/14/13 mammogram-BI-RADS 1 08/14/13 BMD-Frax 3.1 03/22/11 Colonoscopy-poor prep, cecal mucosa not visualized well ----------------------------------------------------------- Numbness in feet: patient c/o numbness in feet when sitting and reports symptom gets better when standing or walking.   Abdominal discomfort: Patient reports she feels like she has air bubble in her upper abdomen and mid chest when lying down.  Review of Systems  Constitutional: Positive for diaphoresis and fatigue.  HENT: Positive for tinnitus.   Eyes: Positive for photophobia and itching.  Respiratory: Positive for chest tightness.   Cardiovascular: Positive for chest pain and leg swelling.  Gastrointestinal: Negative.   Endocrine: Positive for polydipsia.  Genitourinary: Negative.   Musculoskeletal: Positive for neck stiffness.  Skin: Negative.   Allergic/Immunologic: Positive for environmental allergies.  Neurological: Positive for numbness and headaches.  Hematological: Bruises/bleeds easily.  Psychiatric/Behavioral: Negative.    Social History   Social History  . Marital Status: Married    Spouse Name: N/A  . Number of Children: N/A  . Years of Education: N/A   Occupational History  . Not on file.   Social History Main Topics  . Smoking status: Never Smoker   . Smokeless tobacco: Never Used  . Alcohol Use: No  . Drug Use: No  . Sexual Activity: Not on file   Other Topics Concern  . Not on file   Social  History Narrative   History reviewed. No pertinent past medical history.   Patient Active Problem List   Diagnosis Date Noted  . Cerebrovascular accident (CVA) ([place]) 12/25/2014  . Shingles 12/25/2014  . Acid reflux 08/03/2014  . 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 Cerebral vascular accident ([place]) 08/03/2014  . Allergic rhinitis 07/31/2014  . 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 Atrophic vaginitis 07/31/2014  . Atypical chest pain 07/31/2014  . Blood pressure elevated without history of HTN 07/31/2014  . Chest pain 07/31/2014  . CN (constipation) 07/31/2014  . Deep vein thrombosis ([place]) 07/31/2014  . Concussion injury of body structure 07/31/2014  . Elevated hemoglobin ([place]) 07/31/2014  . Fatigue 07/31/2014  . Hemorrhoid 07/31/2014  . Calcium blood increased 07/31/2014  . Hyperlipidemia 07/31/2014  . Blood glucose elevated 07/31/2014  . Leg swelling 07/31/2014  . Lichen sclerosus 54/98/2641  . NASH (nonalcoholic steatohepatitis) 07/31/2014  . Headache, migraine 07/31/2014  . Burning or prickling sensation 07/31/2014  . 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 Lung mass 07/31/2014  . Abdominal pain, right upper quadrant 07/31/2014  . 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 Apnea, sleep 07/31/2014  . Avitaminosis D 07/31/2014  . Cephalalgia 10/21/2013  . Abnormal gait 09/24/2013  . H/O head injury 09/24/2013  . Temporary cerebral vascular dysfunction 11/15/1996    Past Surgical History  Procedure Laterality Date  . Knee surgery  08/05/2007  . Knee arthroscopy w/ acl reconstruction Right 2008  . Ankle fracture surgery  2007  . Ganglion cyst excision Right     wrist  . Nissen fundoplication  2012  . Lasik      Her family history includes Cancer in her brother; Congestive Heart Failure in her mother; Diabetes in her father; Parkinson's disease in her mother; Scleroderma in her sister.    Previous Medications   ASPIRIN 81 MG TABLET    Take 1  tablet by mouth daily.   B COMPLEX VITAMINS PO    Take 1 tablet by mouth 2 (two) times daily.   BIOFLAVONOID PRODUCTS (VITAMIN C PLUS) 500 MG TABS    Take 1 tablet by mouth daily.    CHLORPHENIRAMINE-PSE-IBUPROFEN 2-30-200 MG TABS    Take 1 tablet by mouth as needed.    CHOLECALCIFEROL (VITAMIN D) 2000 UNITS CAPS    Take 4 capsules by mouth daily.   CHROMIUM PICOLINATE 1000 MCG TABS    Take 1 tablet by mouth daily. Reported on 06/02/2015   COENZYME Q10 (CO Q10) 200 MG CAPS    Take 1 capsule by mouth 2 (two) times daily.   CYCLOSPORINE (RESTASIS) 0.05 % OPHTHALMIC EMULSION    Apply to eye 2 (two) times daily.   MULTIPLE VITAMIN PO    Take 1 tablet by mouth daily.   PRAVASTATIN (PRAVACHOL) 10 MG TABLET    Take 1 tablet (10 mg total) by mouth at bedtime.   RIBOFLAVIN (VITAMIN B-2) 100 MG TABS TABLET    Take 1 tablet by mouth daily.   TRIAMCINOLONE CREAM (KENALOG) 0.1 %    TRIAMCINOLONE ACETONIDE, 0.1% (External Cream)  1 Cream apply to affected area as needed for 0 days  Quantity: 45;  Refills: 1   Ordered :12-May-2014  Margarita Rana MD;  Started 12-May-2014 Active Comments: Medication taken as needed.   VENLAFAXINE (EFFEXOR) 75 MG TABLET    Take 1 tablet by mouth daily.    Patient Care Team: Margarita Rana, MD as PCP - General (Family Medicine)     Objective:   Vitals: BP 100/60 mmHg  Pulse 92  Temp(Src) 98.2 F (36.8 C) (Oral)  Resp 16  Ht 5' 5"  (1.651 m)  Wt 178 lb (80.74 kg)  BMI 29.62 kg/m2  SpO2 96%  Physical Exam  Constitutional: She is oriented to person, place, and time. She appears well-developed and well-nourished.  HENT:  Head: Normocephalic and atraumatic.  Right Ear: Tympanic membrane, external ear and ear canal normal.  Left Ear: Tympanic membrane, external ear and ear canal normal.  Nose: Nose normal.  Mouth/Throat: Uvula is midline, oropharynx is clear and moist and mucous membranes are normal.  Eyes: Conjunctivae, EOM and lids are normal. Pupils are equal, round,  and reactive to light.  Neck: Trachea normal and normal range of motion. Neck supple. Carotid bruit is not present. No thyroid mass and no thyromegaly present.  Cardiovascular: Normal rate, regular rhythm and normal heart sounds.   Pulmonary/Chest: Effort normal and breath sounds normal.  Abdominal: Soft. Normal appearance and bowel sounds are normal. There is no hepatosplenomegaly. There is no tenderness.  Genitourinary: No breast swelling, tenderness or discharge.  Musculoskeletal: Normal range of motion.  Decreased reflexes bilateral lowe extremities  Lymphadenopathy:    She has no cervical adenopathy.    She has no axillary adenopathy.  Neurological: She is alert and oriented to person, place, and time. She has normal strength. No cranial nerve deficit.  Skin: Skin is warm, dry and intact.  Psychiatric: She has a normal mood and affect. Her speech is normal and behavior is normal. Judgment and thought content normal. Cognition and memory are normal.  Activities of Daily Living In your present state of health, do you have any difficulty performing the following activities: 06/08/2015  Hearing? N  Vision? N  Difficulty concentrating or making decisions? N  Walking or climbing stairs? N  Dressing or bathing? N  Doing errands, shopping? N    Fall Risk Assessment Fall Risk  06/08/2015 08/03/2014  Falls in the past year? No No     Depression Screen PHQ 2/9 Scores 06/08/2015  PHQ - 2 Score 0    Cognitive Testing - 6-CIT  Correct? Score   What year is it? yes 0 0 or 4  What month is it? yes 0 0 or 3  Memorize:    Pia Mau,  42,  [address]  [place],      What time is it? (within 1 hour) yes 0 0 or 3  Count backwards from 20 yes 0 0, 2, or 4  Name the months of the year yes 0 0, 2, or 4  Repeat name & address above no 2 0, 2, 4, 6, 8, or 10       TOTAL SCORE  2/28   Interpretation:  Normal  Normal (0-7) Abnormal (8-28)       Assessment & Plan:     Annual Wellness  Visit  Reviewed patient's Family Medical History Reviewed and updated list of patient's medical providers Assessment of cognitive impairment was done Assessed patient's functional ability Established a written schedule for health screening [place] Completed and Reviewed  Exercise Activities and Dietary recommendations Goals    None      Immunization History  Administered Date(s) Administered  . Influenza, High Dose Seasonal PF 01/14/2015  . Pneumococcal Polysaccharide-23 12/01/2011  . Tdap 05/30/2005    Discussed health benefits of physical activity, and encouraged her to engage in regular exercise appropriate for her age and condition.       1. Medicare annual wellness visit, subsequent Stable. Patient advised to continue eating healthy and exercise daily.  2. Encounter for screening mammogram for breast cancer - MM DIGITAL SCREENING BILATERAL; Future  3. Need for pneumococcal vaccination - Pneumococcal conjugate vaccine 13-valent IM  4. Paresthesia New problem. Patient advised to double up on her OTC vitamin b-12.  Diabetic Foot Exam - Simple   Simple Foot Form  Diabetic Foot exam was performed with the following findings:  Yes 06/08/2015  4:13 PM  Visual Inspection  No deformities, no ulcerations, no other skin breakdown bilaterally:  Yes  Sensation Testing  Intact to touch and monofilament testing bilaterally:  Yes  Pulse Check  Posterior Tibialis and Dorsalis pulse intact bilaterally:  Yes  Comments      5. Gas New problem. Patient advised to call if symptoms are not improving or worsening. Patient may need a GI referral.    Patient seen and examined by Dr. Jerrell Belfast, and note scribed by Philbert Riser. Dimas, CMA.  I have reviewed the document for accuracy and completeness and I agree with above. Jerrell Belfast, MD   Margarita Rana, MD     ------------------------------------------------------------------------------------------------------------

## 2015-06-09 ENCOUNTER — Other Ambulatory Visit: Payer: Self-pay | Admitting: Family Medicine

## 2015-06-09 MED ORDER — TRIAMCINOLONE ACETONIDE 0.1 % EX CREA: TOPICAL_CREAM | CUTANEOUS | Status: DC | PRN

## 2015-06-09 NOTE — Telephone Encounter (Signed)
Ok to refill. Thanks.

## 2015-06-09 NOTE — Telephone Encounter (Signed)
RX sent to pharmacy. Lab results printed at front desk for pick up per patient's request. Patient is aware.

## 2015-06-09 NOTE — Telephone Encounter (Signed)
Pt contacted office for refill request on the following medications:  triamcinolone cream (KENALOG) 0.1 %.  [facility] is also requesting to pick a copy of her labs/MW

## 2015-07-20 DIAGNOSIS — M216X2 Other acquired deformities of left foot: Secondary | ICD-10-CM | POA: Diagnosis not present

## 2015-07-20 DIAGNOSIS — M7752 Other enthesopathy of left foot: Secondary | ICD-10-CM | POA: Diagnosis not present

## 2015-07-20 DIAGNOSIS — M79672 Pain in left foot: Secondary | ICD-10-CM | POA: Diagnosis not present

## 2015-08-17 DIAGNOSIS — H401123 Primary open-angle glaucoma, left eye, severe stage: Secondary | ICD-10-CM | POA: Diagnosis not present

## 2015-08-25 DIAGNOSIS — M7752 Other enthesopathy of left foot: Secondary | ICD-10-CM | POA: Diagnosis not present

## 2015-08-25 DIAGNOSIS — M216X2 Other acquired deformities of left foot: Secondary | ICD-10-CM | POA: Diagnosis not present

## 2015-08-26 ENCOUNTER — Telehealth: Payer: Self-pay

## 2015-08-26 ENCOUNTER — Telehealth: Payer: Self-pay | Admitting: Family Medicine

## 2015-08-26 DIAGNOSIS — B029 Zoster without complications: Secondary | ICD-10-CM

## 2015-08-26 MED ORDER — VALACYCLOVIR HCL 1 G PO TABS: 1000.0000 mg | ORAL_TABLET | Freq: Three times a day (TID) | ORAL | Status: DC

## 2015-08-26 NOTE — Telephone Encounter (Signed)
error 

## 2015-08-26 NOTE — Telephone Encounter (Signed)
Pt called c/o shingles sx on the same area that her shingles were before. Does have burning. X 1 day. Sent in Valtrex per Dr. Sharyon Medicus verbal order. Renaldo Fiddler, CMA

## 2015-09-06 ENCOUNTER — Other Ambulatory Visit: Payer: Self-pay | Admitting: Family Medicine

## 2015-09-06 DIAGNOSIS — F329 Major depressive disorder, single episode, unspecified: Secondary | ICD-10-CM

## 2015-09-06 DIAGNOSIS — E785 Hyperlipidemia, unspecified: Secondary | ICD-10-CM

## 2015-09-06 DIAGNOSIS — F32A Depression, unspecified: Secondary | ICD-10-CM

## 2015-10-25 ENCOUNTER — Ambulatory Visit (INDEPENDENT_AMBULATORY_CARE_PROVIDER_SITE_OTHER): Payer: Medicare Other | Admitting: Family Medicine

## 2015-10-25 VITALS — BP 132/78 | HR 72 | Temp 97.8°F | Resp 14 | Wt 181.0 lb

## 2015-10-25 DIAGNOSIS — R739 Hyperglycemia, unspecified: Secondary | ICD-10-CM | POA: Diagnosis not present

## 2015-10-25 DIAGNOSIS — R5383 Other fatigue: Secondary | ICD-10-CM | POA: Diagnosis not present

## 2015-10-25 DIAGNOSIS — E785 Hyperlipidemia, unspecified: Secondary | ICD-10-CM | POA: Diagnosis not present

## 2015-10-25 MED ORDER — ONETOUCH ULTRASOFT LANCETS MISC: 12 refills | Status: AC

## 2015-10-25 MED ORDER — METOPROLOL SUCCINATE ER 25 MG PO TB24
25.0000 mg | ORAL_TABLET | Freq: Every day | ORAL | 5 refills | Status: DC
Start: 2015-10-25 — End: 2016-02-08

## 2015-10-25 MED ORDER — GLUCOSE BLOOD VI STRP: ORAL_STRIP | 12 refills | Status: DC

## 2015-10-25 NOTE — Progress Notes (Signed)
Subjective:  HPI  Patient states Thursday August 25th she did not feel well but patient still went out with her husband and they were walking and looking at some RVs when she developed a afeeling of a low sugar, developed severe sweats. She had 3 candy bars after that episode and felt better after that. She was not able to check her sugar or B/P after that episode. That evening at 5:07 pm at home sugar reading was 76 and at 11pm was 100. Since then readings have been 125, 95, etc. B/P has been 180s-140s/80-70s. She did not have another episode like Thursday but has had fatigue and head pressure since then. Denies any other symptoms including urinary issues. Her story of this episode is vague and hard to follow.  Prior to Admission medications   Medication Sig Start Date End Date Taking? Authorizing Provider  aspirin 81 MG tablet Take 1 tablet by mouth daily.    Historical Provider, MD  B COMPLEX VITAMINS PO Take 1 tablet by mouth 2 (two) times daily.    Historical Provider, MD  Bioflavonoid Products (VITAMIN C PLUS) 500 MG TABS Take 1 tablet by mouth daily.     Historical Provider, MD  Chlorpheniramine-PSE-Ibuprofen 2-30-200 MG TABS Take 1 tablet by mouth as needed.     Historical Provider, MD  Cholecalciferol (VITAMIN D) 2000 UNITS CAPS Take 4 capsules by mouth daily.    Historical Provider, MD  Chromium Picolinate 1000 MCG TABS Take 1 tablet by mouth daily. Reported on 06/02/2015    Historical Provider, MD  Coenzyme Q10 (CO Q10) 200 MG CAPS Take 1 capsule by mouth 2 (two) times daily.    Historical Provider, MD  cycloSPORINE (RESTASIS) 0.05 % ophthalmic emulsion Apply to eye 2 (two) times daily.    Historical Provider, MD  MULTIPLE VITAMIN PO Take 1 tablet by mouth daily.    Historical Provider, MD  pravastatin (PRAVACHOL) 10 MG tablet TAKE 1 TABLET(10 MG) BY MOUTH AT BEDTIME 09/06/15   Clearnce Sorrel Burnette, PA-C  riboflavin (VITAMIN B-2) 100 MG TABS tablet Take 1 tablet by mouth daily.     Historical Provider, MD  triamcinolone cream (KENALOG) 0.1 % Apply topically as needed. 1 Cream apply to affected area as needed 06/09/15   Margarita Rana, MD  valACYclovir (VALTREX) 1000 MG tablet Take 1 tablet (1,000 mg total) by mouth 3 (three) times daily. 08/26/15   Margarita Rana, MD  venlafaxine (EFFEXOR) 75 MG tablet TAKE 1 TABLET BY MOUTH EVERY DAY 09/06/15   Mar Daring, PA-C    Patient Active Problem List   Diagnosis Date Noted  . Cerebrovascular accident (CVA) ([place]) 12/25/2014  . Shingles 12/25/2014  . Acid reflux 08/03/2014  . 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 Cerebral vascular accident ([place]) 08/03/2014  . Allergic rhinitis 07/31/2014  . 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 Atrophic vaginitis 07/31/2014  . Atypical chest pain 07/31/2014  . Blood pressure elevated without history of HTN 07/31/2014  . Chest pain 07/31/2014  . CN (constipation) 07/31/2014  . Deep vein thrombosis ([place]) 07/31/2014  . Concussion injury of body structure 07/31/2014  . Elevated hemoglobin ([place]) 07/31/2014  . Fatigue 07/31/2014  . Hemorrhoid 07/31/2014  . Calcium blood increased 07/31/2014  . Hyperlipidemia 07/31/2014  . Blood glucose elevated 07/31/2014  . Leg swelling 07/31/2014  . Lichen sclerosus 79/03/4095  . NASH (nonalcoholic steatohepatitis) 07/31/2014  . Headache, migraine 07/31/2014  . Burning or prickling sensation 07/31/2014  . 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  Lung mass 07/31/2014  . Abdominal pain, right upper quadrant 07/31/2014  . 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 Apnea, sleep 07/31/2014  . Avitaminosis D 07/31/2014  . Cephalalgia 10/21/2013  . Abnormal gait 09/24/2013  . H/O head injury 09/24/2013  . Temporary cerebral vascular dysfunction 11/15/1996    No past medical history on file.  Social History   Social History  . Marital status: Married    Spouse name: N/A  . Number of children: N/A  . Years of education: N/A    Occupational History  . Not on file.   Social History Main Topics  . Smoking status: Never Smoker  . Smokeless tobacco: Never Used  . Alcohol use No  . Drug use: No  . Sexual activity: Not on file   Other Topics Concern  . Not on file   Social History Narrative  . No narrative on file    Allergies  Allergen Reactions  . Amoxicillin Other (See Comments)    Sleepy  . Baby Oil   . Blue Dyes (Parenteral)   . Cortisone     Face purple  . Diphenhydramine Other (See Comments)  . Doxycycline Other (See Comments)  . Esomeprazole Other (See Comments)  . Iodinated Diagnostic Agents Other (See Comments)  . Nortriptyline Hcl     Migraine  . Other Other (See Comments)  . Sulfamethoxazole-Trimethoprim Other (See Comments)  . Tetracycline Other (See Comments)  . Levofloxacin Rash  . Prednisone Rash and Swelling  . Sulfa Antibiotics Rash    Review of Systems  Constitutional: Positive for malaise/fatigue.  Eyes: Negative.   Respiratory: Negative.   Cardiovascular: Positive for palpitations. Claudication: at times.  Gastrointestinal: Negative.   Musculoskeletal: Positive for joint pain and myalgias.  Skin: Negative.   Neurological: Negative.   Endo/Heme/Allergies: Negative.   Psychiatric/Behavioral: Negative.     Immunization History  Administered Date(s) Administered  . Influenza, High Dose Seasonal PF 01/14/2015  . Pneumococcal Conjugate-13 06/08/2015  . Pneumococcal Polysaccharide-23 12/01/2011  . Tdap 05/30/2005   Objective:  BP 132/78   Pulse 72   Temp 97.8 F (36.6 C)   Resp 14   Wt 181 lb (82.1 kg)   BMI 30.12 kg/m   Physical Exam  Constitutional: She is oriented to person, place, and time and well-developed, well-nourished, and in no distress.  HENT:  Head: Normocephalic and atraumatic.  Right Ear: External ear normal.  Left Ear: External ear normal.  Nose: Nose normal.  Eyes: Conjunctivae are normal. Pupils are equal, round, and reactive to light.   Neck: Normal range of motion. Neck supple.  Cardiovascular: Normal rate, normal heart sounds and intact distal pulses.   Pulmonary/Chest: Effort normal and breath sounds normal. No respiratory distress. She has no wheezes.  Abdominal: Soft.  Neurological: She is alert and oriented to person, place, and time.  Skin: Skin is warm and dry.  Psychiatric: Mood, memory, affect and judgment normal.    Lab Results  Component Value Date   WBC 8.5 06/02/2015   HGB 14.7 03/10/2014   HCT 44.4 06/02/2015   PLT 254 06/02/2015   GLUCOSE 98 06/02/2015   CHOL 206 (H) 06/02/2015   TRIG 79 06/02/2015   HDL 77 06/02/2015   LDLCALC 113 (H) 06/02/2015   TSH 2.330 06/02/2015   HGBA1C 6.0 (H) 06/02/2015    CMP     Component Value Date/Time   NA 141 06/02/2015 1008   NA 138 06/17/2013 2031   K 5.0 06/02/2015  1008   K 4.1 06/17/2013 2031   CL 100 06/02/2015 1008   CL 105 06/17/2013 2031   CO2 26 06/02/2015 1008   CO2 24 06/17/2013 2031   GLUCOSE 98 06/02/2015 1008   GLUCOSE 151 (H) 06/17/2013 2031   BUN 14 06/02/2015 1008   BUN 19 (H) 06/17/2013 2031   CREATININE 0.85 06/02/2015 1008   CREATININE 1.15 06/17/2013 2031   CALCIUM 9.9 06/02/2015 1008   CALCIUM 10.8 (H) 06/17/2013 2031   PROT 6.8 06/02/2015 1008   PROT 9.0 (H) 06/17/2013 2031   ALBUMIN 4.5 06/02/2015 1008   ALBUMIN 4.9 06/17/2013 2031   AST 23 06/02/2015 1008   AST 39 (H) 06/17/2013 2031   ALT 26 06/02/2015 1008   ALT 32 06/17/2013 2031   ALKPHOS 61 06/02/2015 1008   ALKPHOS 78 06/17/2013 2031   BILITOT 0.3 06/02/2015 1008   BILITOT 0.3 06/17/2013 2031   GFRNONAA 70 06/02/2015 1008   GFRNONAA 49 (L) 06/17/2013 2031   GFRAA 81 06/02/2015 1008   GFRAA 57 (L) 06/17/2013 2031    Assessment and Plan :  1. Other fatigue EKG stable. I do not think symptoms patient is having is hypoglycemia related, she is not on medication for sugar. Discussed this in detail. Also offered endocrinologist and cardiologist referral but  patient wants to wait at this time. - EKG 12-Lead More than 50% of this 25 minute visit is spent in counseling the patient regarding these issues. 2. Hyperglycemia Clinically I find no evidence the patient has had any hypoglycemia. 3. Palpitations/elevated b/p Off and on EKG stable. Will start Metoprolol 25 mg 1 tablet daily. May need to refer to cardiology. Re check in 1 to 2 months. 4. Hyperlipidemia Patient was seen and examined by Dr. Eulas Post and note was scribed by Theressa Millard, [place].    Miguel Aschoff MD [facility] 10/25/2015 3:07 PM

## 2015-11-02 ENCOUNTER — Other Ambulatory Visit: Payer: Self-pay

## 2015-11-17 ENCOUNTER — Ambulatory Visit (INDEPENDENT_AMBULATORY_CARE_PROVIDER_SITE_OTHER): Payer: Medicare Other | Admitting: Family Medicine

## 2015-11-17 ENCOUNTER — Encounter: Payer: Self-pay | Admitting: Family Medicine

## 2015-11-17 VITALS — BP 132/70 | HR 64 | Temp 97.8°F | Resp 18 | Wt 184.0 lb

## 2015-11-17 DIAGNOSIS — R002 Palpitations: Secondary | ICD-10-CM

## 2015-11-17 DIAGNOSIS — J0100 Acute maxillary sinusitis, unspecified: Secondary | ICD-10-CM

## 2015-11-17 DIAGNOSIS — I10 Essential (primary) hypertension: Secondary | ICD-10-CM | POA: Diagnosis not present

## 2015-11-17 MED ORDER — AZITHROMYCIN 250 MG PO TABS: ORAL_TABLET | ORAL | 0 refills | Status: DC

## 2015-11-17 NOTE — Progress Notes (Signed)
Subjective:  HPI Pt is here today for a possible sinus infection. She reports that she started having a headache in the back of her head and her neck, then she started having the sinus congestion, throat irritation, cough and post nasal drainage. The symptoms started about a week ago and she has been trying several OTC medications. Pt reports that she feels like the drainage is going down into her chest now. She reports that the only antibiotic that she can take is a Zpak, she has intolerance or allergies to most everything else.     Hypertension, follow-up:  BP Readings from Last 3 Encounters:  11/17/15 132/70  10/25/15 132/78  06/08/15 100/60     She was last seen for hypertension 1 months ago.  BP at that visit was 132/78. Management since that visit includes started metoprolol once daily. She reports good compliance with treatment. She is not having side effects.   Outside blood pressures are running 130-140's/70-80's She is experiencing none.  Patient denies chest pain, chest pressure/discomfort, claudication, dyspnea, exertional chest pressure/discomfort, fatigue, irregular heart beat, lower extremity edema, near-syncope, orthopnea, palpitations, paroxysmal nocturnal dyspnea and syncope.    Wt Readings from Last 3 Encounters:  11/17/15 184 lb (83.5 kg)  10/25/15 181 lb (82.1 kg)  06/08/15 178 lb (80.7 kg)    ------------------------------------------------------------------------     Prior to Admission medications   Medication Sig Start Date End Date Taking? Authorizing Provider  aspirin 81 MG tablet Take 1 tablet by mouth daily.    Historical Provider, MD  Bioflavonoid Products (VITAMIN C PLUS) 500 MG TABS Take 1 tablet by mouth daily.     Historical Provider, MD  Biotin 10 MG TABS Take by mouth daily.    Historical Provider, MD  Chlorpheniramine-PSE-Ibuprofen 2-30-200 MG TABS Take 1 tablet by mouth as needed.     Historical Provider, MD  Cholecalciferol (VITAMIN  D) 2000 UNITS CAPS Take 4 capsules by mouth daily.    Historical Provider, MD  Coenzyme Q10 (CO Q10) 200 MG CAPS Take 1 capsule by mouth 2 (two) times daily.    Historical Provider, MD  cycloSPORINE (RESTASIS) 0.05 % ophthalmic emulsion Apply to eye 2 (two) times daily.    Historical Provider, MD  glucose blood test strip Check sugar once daily , DX R73.9-needs strips for One touch ultra 2 10/25/15   Jerrol Banana., MD  Lancets [facility] ULTRASOFT) lancets Check sugar once daily DX 73.9 needs for one touch ultra 2 lancets 10/25/15   Jerrol Banana., MD  metoprolol succinate (TOPROL-XL) 25 MG 24 hr tablet Take 1 tablet (25 mg total) by mouth daily. 10/25/15   Jerrol Banana., MD  Multiple Vitamins-Minerals (MULTIVITAMIN ADULT PO) Take by mouth. Takes Osteosheath, Immune complete, Petadolex vitamins daily.    Historical Provider, MD  pravastatin (PRAVACHOL) 10 MG tablet TAKE 1 TABLET(10 MG) BY MOUTH AT BEDTIME 09/06/15   Mar Daring, PA-C  Probiotic Product (PROBIOTIC-10 PO) Take by mouth daily.    Historical Provider, MD  riboflavin (VITAMIN B-2) 100 MG TABS tablet Take 1 tablet by mouth daily.    Historical Provider, MD  triamcinolone cream (KENALOG) 0.1 % Apply topically as needed. 1 Cream apply to affected area as needed Patient not taking: Reported on 10/25/2015 06/09/15   Margarita Rana, MD  valACYclovir (VALTREX) 1000 MG tablet Take 1 tablet (1,000 mg total) by mouth 3 (three) times daily. Patient not taking: Reported on 10/25/2015 08/26/15   Margarita Rana,  MD  venlafaxine (EFFEXOR) 75 MG tablet TAKE 1 TABLET BY MOUTH EVERY DAY 09/06/15   Mar Daring, PA-C  vitamin B-12 (CYANOCOBALAMIN) 100 MCG tablet Take 100 mcg by mouth daily.    Historical Provider, MD    Patient Active Problem List   Diagnosis Date Noted  . Cerebrovascular accident (CVA) ([place]) 12/25/2014  . Shingles 12/25/2014  . Acid reflux 08/03/2014  . 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 Cerebral vascular  accident ([place]) 08/03/2014  . Allergic rhinitis 07/31/2014  . 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 Atrophic vaginitis 07/31/2014  . Atypical chest pain 07/31/2014  . Blood pressure elevated without history of HTN 07/31/2014  . Chest pain 07/31/2014  . CN (constipation) 07/31/2014  . Deep vein thrombosis ([place]) 07/31/2014  . Concussion injury of body structure 07/31/2014  . Elevated hemoglobin ([place]) 07/31/2014  . Fatigue 07/31/2014  . Hemorrhoid 07/31/2014  . Calcium blood increased 07/31/2014  . Hyperlipidemia 07/31/2014  . Blood glucose elevated 07/31/2014  . Leg swelling 07/31/2014  . Lichen sclerosus 04/88/8916  . NASH (nonalcoholic steatohepatitis) 07/31/2014  . Headache, migraine 07/31/2014  . Burning or prickling sensation 07/31/2014  . 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 Lung mass 07/31/2014  . Abdominal pain, right upper quadrant 07/31/2014  . 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 Apnea, sleep 07/31/2014  . Avitaminosis D 07/31/2014  . Cephalalgia 10/21/2013  . Abnormal gait 09/24/2013  . H/O head injury 09/24/2013  . Temporary cerebral vascular dysfunction 11/15/1996    History reviewed. No pertinent past medical history.  Social History   Social History  . Marital status: Married    Spouse name: N/A  . Number of children: N/A  . Years of education: N/A   Occupational History  . Not on file.   Social History Main Topics  . Smoking status: Never Smoker  . Smokeless tobacco: Never Used  . Alcohol use No  . Drug use: No  . Sexual activity: Not on file   Other Topics Concern  . Not on file   Social History Narrative  . No narrative on file    Allergies  Allergen Reactions  . Amoxicillin Other (See Comments)    Sleepy  . Baby Oil   . Blue Dyes (Parenteral)   . Cortisone     Face purple  . Diphenhydramine Other (See Comments)  . Doxycycline Other (See Comments)  . Esomeprazole Other (See Comments)  . Iodinated  Diagnostic Agents Other (See Comments)  . Nortriptyline Hcl     Migraine  . Other Other (See Comments)  . Sulfamethoxazole-Trimethoprim Other (See Comments)  . Tetracycline Other (See Comments)  . Levofloxacin Rash  . Prednisone Rash and Swelling  . Sulfa Antibiotics Rash    Review of Systems  Constitutional: Positive for malaise/fatigue.  HENT: Positive for congestion and sore throat.   Eyes: Negative.   Respiratory: Positive for cough.   Cardiovascular: Negative.   Gastrointestinal: Negative.   Genitourinary: Negative.   Musculoskeletal: Negative.   Skin: Negative.   Neurological: Positive for headaches.  Endo/Heme/Allergies: Negative.   Psychiatric/Behavioral: Negative.     Immunization History  Administered Date(s) Administered  . Influenza, High Dose Seasonal PF 01/14/2015  . Pneumococcal Conjugate-13 06/08/2015  . Pneumococcal Polysaccharide-23 12/01/2011  . Tdap 05/30/2005   Objective:  BP 132/70 (BP Location: Left Arm, Patient Position: Sitting, Cuff Size: Large)   Pulse 64   Temp 97.8 F (36.6 C) (Oral)   Resp 18   Wt 184 lb (83.5  kg)   SpO2 98%   BMI 30.62 kg/m   Physical Exam  Constitutional: She is oriented to person, place, and time and well-developed, well-nourished, and in no distress.  HENT:  Head: Normocephalic and atraumatic.  Right Ear: External ear normal.  Left Ear: External ear normal.  Nose: Nose normal.  Mouth/Throat: Oropharynx is clear and moist.  Maxillary sinus drainage. Posterior pharynx cobblestoning.  Eyes: Conjunctivae and EOM are normal. Pupils are equal, round, and reactive to light.  Neck: Normal range of motion. Neck supple.  Cardiovascular: Normal rate, regular rhythm, normal heart sounds and intact distal pulses.   Pulmonary/Chest: Effort normal and breath sounds normal.  Abdominal: Soft.  Musculoskeletal: Normal range of motion.  Neurological: She is alert and oriented to person, place, and time. She has normal  reflexes. Gait normal. GCS score is 15.  Skin: Skin is warm and dry.  Psychiatric: Mood, memory, affect and judgment normal.    Lab Results  Component Value Date   WBC 8.5 06/02/2015   HGB 14.7 03/10/2014   HCT 44.4 06/02/2015   PLT 254 06/02/2015   GLUCOSE 98 06/02/2015   CHOL 206 (H) 06/02/2015   TRIG 79 06/02/2015   HDL 77 06/02/2015   LDLCALC 113 (H) 06/02/2015   TSH 2.330 06/02/2015   HGBA1C 6.0 (H) 06/02/2015    CMP     Component Value Date/Time   NA 141 06/02/2015 1008   NA 138 06/17/2013 2031   K 5.0 06/02/2015 1008   K 4.1 06/17/2013 2031   CL 100 06/02/2015 1008   CL 105 06/17/2013 2031   CO2 26 06/02/2015 1008   CO2 24 06/17/2013 2031   GLUCOSE 98 06/02/2015 1008   GLUCOSE 151 (H) 06/17/2013 2031   BUN 14 06/02/2015 1008   BUN 19 (H) 06/17/2013 2031   CREATININE 0.85 06/02/2015 1008   CREATININE 1.15 06/17/2013 2031   CALCIUM 9.9 06/02/2015 1008   CALCIUM 10.8 (H) 06/17/2013 2031   PROT 6.8 06/02/2015 1008   PROT 9.0 (H) 06/17/2013 2031   ALBUMIN 4.5 06/02/2015 1008   ALBUMIN 4.9 06/17/2013 2031   AST 23 06/02/2015 1008   AST 39 (H) 06/17/2013 2031   ALT 26 06/02/2015 1008   ALT 32 06/17/2013 2031   ALKPHOS 61 06/02/2015 1008   ALKPHOS 78 06/17/2013 2031   BILITOT 0.3 06/02/2015 1008   BILITOT 0.3 06/17/2013 2031   GFRNONAA 70 06/02/2015 1008   GFRNONAA 49 (L) 06/17/2013 2031   GFRAA 81 06/02/2015 1008   GFRAA 57 (L) 06/17/2013 2031    Assessment and Plan :  1. Acute maxillary sinusitis, recurrence not specified  - azithromycin (ZITHROMAX) 250 MG tablet; Take 2 tablets on first day, then 1 daily until finished.  Dispense: 6 each; Refill: 0  2. Palpitations Resolved with Metoprolol .Continue this therapy.  3. Essential hypertension Improved with Metoprolol.   HPI, Exam, and A&P Transcribed under the direction and in the presence of Bentley Fissel L. Cranford Mon, MD  Electronically Signed: Webb Laws, CMA I have done the exam and reviewed the  above chart and it is accurate to the best of my knowledge.  Miguel Aschoff MD [facility] 11/17/2015 10:40 AM

## 2015-11-19 ENCOUNTER — Telehealth: Payer: Self-pay | Admitting: Physician Assistant

## 2015-11-19 MED ORDER — FLUCONAZOLE 150 MG PO TABS: 150.0000 mg | ORAL_TABLET | Freq: Once | ORAL | 0 refills | Status: AC

## 2015-11-19 NOTE — Telephone Encounter (Signed)
Advised patient as below.  

## 2015-11-19 NOTE — Telephone Encounter (Signed)
Please review. Thanks!  

## 2015-11-19 NOTE — Telephone Encounter (Signed)
Diflucan sent to [facility]

## 2015-11-19 NOTE — Telephone Encounter (Signed)
Pt states she has been on an antibiotic this week and feels like she may need something to help with a yeast infection.  Pt is not having symptoms yet but would like to have this on hand.  Pt is requesting 2 pills.  [employer].  [phone]

## 2015-11-24 ENCOUNTER — Ambulatory Visit: Payer: Medicare Other | Admitting: Family Medicine

## 2015-12-02 MED ORDER — FLUCONAZOLE 150 MG PO TABS: 150.0000 mg | ORAL_TABLET | ORAL | 1 refills | Status: AC

## 2015-12-29 DIAGNOSIS — Z23 Encounter for immunization: Secondary | ICD-10-CM | POA: Diagnosis not present

## 2016-01-14 DIAGNOSIS — M2391 Unspecified internal derangement of right knee: Secondary | ICD-10-CM | POA: Diagnosis not present

## 2016-01-14 DIAGNOSIS — M25561 Pain in right knee: Secondary | ICD-10-CM | POA: Diagnosis not present

## 2016-01-18 DIAGNOSIS — H401121 Primary open-angle glaucoma, left eye, mild stage: Secondary | ICD-10-CM | POA: Diagnosis not present

## 2016-01-25 ENCOUNTER — Other Ambulatory Visit: Payer: Self-pay | Admitting: Physician Assistant

## 2016-01-25 DIAGNOSIS — F32A Depression, unspecified: Secondary | ICD-10-CM

## 2016-01-25 DIAGNOSIS — F329 Major depressive disorder, single episode, unspecified: Secondary | ICD-10-CM

## 2016-01-26 ENCOUNTER — Ambulatory Visit (INDEPENDENT_AMBULATORY_CARE_PROVIDER_SITE_OTHER): Payer: Medicare Other | Admitting: Family Medicine

## 2016-01-26 VITALS — BP 128/74 | HR 56 | Temp 97.7°F | Resp 14 | Wt 184.0 lb

## 2016-01-26 DIAGNOSIS — F3289 Other specified depressive episodes: Secondary | ICD-10-CM

## 2016-01-26 DIAGNOSIS — R232 Flushing: Secondary | ICD-10-CM | POA: Diagnosis not present

## 2016-01-26 DIAGNOSIS — R739 Hyperglycemia, unspecified: Secondary | ICD-10-CM

## 2016-01-26 DIAGNOSIS — Z8619 Personal history of other infectious and parasitic diseases: Secondary | ICD-10-CM

## 2016-01-26 DIAGNOSIS — B029 Zoster without complications: Secondary | ICD-10-CM

## 2016-01-26 DIAGNOSIS — R002 Palpitations: Secondary | ICD-10-CM | POA: Diagnosis not present

## 2016-01-26 DIAGNOSIS — I10 Essential (primary) hypertension: Secondary | ICD-10-CM

## 2016-01-26 LAB — POCT GLYCOSYLATED HEMOGLOBIN (HGB A1C): Hemoglobin A1C: 5.8

## 2016-01-26 MED ORDER — VENLAFAXINE HCL 75 MG PO TABS: 75.0000 mg | ORAL_TABLET | Freq: Every day | ORAL | 3 refills | Status: DC

## 2016-01-26 MED ORDER — VALACYCLOVIR HCL 1 G PO TABS: 1000.0000 mg | ORAL_TABLET | Freq: Three times a day (TID) | ORAL | 2 refills | Status: DC

## 2016-01-26 NOTE — Progress Notes (Signed)
Virginia Phillips  MRN: [id number] DOB: Aug 14, 1945  Subjective:  HPI  Patient is here for follow up Last office visit was on 11/17/15 for sinusitis and also to discuss blood pressure and palpitations she has had off and on. At that time patient was feeling better with palpitations and blood pressure since been on Metoprolol. She is still feeling well on the medication. No cardiac symptoms present. She checks her b/p sometimes and readings have been around 138/80s. Pt has multiple issues that she wants to discuss today,she also has chronic medical left elbow pain. BP Readings from Last 3 Encounters:  01/26/16 128/74  11/17/15 132/70  10/25/15 132/78   She checks her sugar sometimes and morning readings have been around 120s. No hypoglycemic episodes in the past 2 months. Lab Results  Component Value Date   HGBA1C 6.0 (H) 06/02/2015   Last routine labs were done in April 2017.  She wanted to discuss getting refill on Valtrex. In the area of where she had shingles before behind her right knee up her right leg to the back area she has started to itch and she was told by pharmacist that means shingles are coming on again. Patient Active Problem List   Diagnosis Date Noted  . Cerebrovascular accident (CVA) ([place]) 12/25/2014  . Shingles 12/25/2014  . Acid reflux 08/03/2014  . 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 Cerebral vascular accident ([place]) 08/03/2014  . Allergic rhinitis 07/31/2014  . 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 Atrophic vaginitis 07/31/2014  . Atypical chest pain 07/31/2014  . Blood pressure elevated without history of HTN 07/31/2014  . Chest pain 07/31/2014  . CN (constipation) 07/31/2014  . Deep vein thrombosis ([place]) 07/31/2014  . Concussion injury of body structure 07/31/2014  . Elevated hemoglobin ([place]) 07/31/2014  . Fatigue 07/31/2014  . Hemorrhoid 07/31/2014  . Calcium blood increased 07/31/2014  . Hyperlipidemia 07/31/2014  . Blood glucose elevated 07/31/2014  . Leg  swelling 07/31/2014  . Lichen sclerosus 42/68/3419  . NASH (nonalcoholic steatohepatitis) 07/31/2014  . Headache, migraine 07/31/2014  . Burning or prickling sensation 07/31/2014  . 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 Lung mass 07/31/2014  . Abdominal pain, right upper quadrant 07/31/2014  . 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 Apnea, sleep 07/31/2014  . Avitaminosis D 07/31/2014  . Cephalalgia 10/21/2013  . Abnormal gait 09/24/2013  . H/O head injury 09/24/2013  . Temporary cerebral vascular dysfunction 11/15/1996    No past medical history on file.  Social History   Social History  . Marital status: Married    Spouse name: N/A  . Number of children: N/A  . Years of education: N/A   Occupational History  . Not on file.   Social History Main Topics  . Smoking status: Never Smoker  . Smokeless tobacco: Never Used  . Alcohol use No  . Drug use: No  . Sexual activity: Not on file   Other Topics Concern  . Not on file   Social History Narrative  . No narrative on file    Outpatient Encounter Prescriptions as of 01/26/2016  Medication Sig Note  . aspirin 81 MG tablet Take 1 tablet by mouth daily. 07/31/2014: Received from: [place]:   . Bioflavonoid Products (VITAMIN C PLUS) 500 MG TABS Take 1 tablet by mouth daily.  07/31/2014: Received from: [place]:   . Biotin 10 MG TABS Take by mouth daily.   . Chlorpheniramine-PSE-Ibuprofen 2-30-200 MG TABS  Take 1 tablet by mouth as needed.  07/31/2014: Received from: [facility]  . Cholecalciferol (VITAMIN D) 2000 UNITS CAPS Take 4 capsules by mouth daily. 07/31/2014: Received from: [place]:   . Coenzyme Q10 (CO Q10) 200 MG CAPS Take 1 capsule by mouth 2 (two) times daily. 07/31/2014: Received from: [place]:   . cycloSPORINE (RESTASIS) 0.05 % ophthalmic  emulsion Apply to eye 2 (two) times daily. 07/31/2014: Received from: [place]:   . glucose blood test strip Check sugar once daily , DX R73.9-needs strips for One touch ultra 2   . Lancets (ONETOUCH ULTRASOFT) lancets Check sugar once daily DX 73.9 needs for one touch ultra 2 lancets   . metoprolol succinate (TOPROL-XL) 25 MG 24 hr tablet Take 1 tablet (25 mg total) by mouth daily.   . Multiple Vitamins-Minerals (MULTIVITAMIN ADULT PO) Take by mouth. Takes Osteosheath, Immune complete, Petadolex vitamins daily.   . pravastatin (PRAVACHOL) 10 MG tablet TAKE 1 TABLET(10 MG) BY MOUTH AT BEDTIME   . Probiotic Product (PROBIOTIC-10 PO) Take by mouth daily.   . riboflavin (VITAMIN B-2) 100 MG TABS tablet Take 1 tablet by mouth daily. 08/03/2014: Received from: [employer]:   . venlafaxine (EFFEXOR) 75 MG tablet TAKE 1 TABLET BY MOUTH EVERY DAY   . vitamin B-12 (CYANOCOBALAMIN) 100 MCG tablet Take 100 mcg by mouth daily.   Marland Kitchen FLUZONE HIGH-DOSE 0.5 ML SUSY ADM 0.5ML IM UTD 01/26/2016: Received from: External Pharmacy  . triamcinolone cream (KENALOG) 0.1 % Apply topically as needed. 1 Cream apply to affected area as needed (Patient not taking: Reported on 01/26/2016) 10/25/2015: prn  . valACYclovir (VALTREX) 1000 MG tablet Take 1 tablet (1,000 mg total) by mouth 3 (three) times daily. (Patient not taking: Reported on 01/26/2016) 01/26/2016: prn  . [DISCONTINUED] azithromycin (ZITHROMAX) 250 MG tablet Take 2 tablets on first day, then 1 daily until finished.    No facility-administered encounter medications on file as of 01/26/2016.     Allergies  Allergen Reactions  . Amoxicillin Other (See Comments)    Sleepy  . Baby Oil   . Blue Dyes (Parenteral)   . Cortisone     Face purple  . Diphenhydramine Other (See Comments)  . Doxycycline Other (See Comments)  . Esomeprazole Other (See Comments)  . Iodinated Diagnostic Agents Other (See  Comments)  . Nortriptyline Hcl     Migraine  . Other Other (See Comments)  . Sulfamethoxazole-Trimethoprim Other (See Comments)  . Tetracycline Other (See Comments)  . Levofloxacin Rash  . Prednisone Rash and Swelling  . Sulfa Antibiotics Rash    Review of Systems  Constitutional: Negative.   Eyes: Negative.   Respiratory: Negative.   Cardiovascular: Negative.   Gastrointestinal: Negative.   Musculoskeletal: Positive for joint pain (elbow pain-left side, knee pain, right foot discomfort ).       Has neuroma bilateral feet per patient  Skin: Positive for itching.  Neurological: Negative.   Endo/Heme/Allergies: Negative.   Psychiatric/Behavioral: Negative.     Objective:  BP 128/74   Pulse (!) 56   Temp 97.7 F (36.5 C)   Resp 14   Wt 184 lb (83.5 kg)   BMI 30.62 kg/m   Physical Exam  Constitutional: She is oriented to person, place, and time and well-developed, well-nourished, and in no distress.  HENT:  Head: Normocephalic and atraumatic.  Eyes: Conjunctivae are normal. Pupils are equal, round, and  reactive to light.  Neck: Normal range of motion. Neck supple.  Cardiovascular: Normal rate, regular rhythm, normal heart sounds and intact distal pulses.   No murmur heard. Pulmonary/Chest: Effort normal and breath sounds normal. No respiratory distress. She has no wheezes.  Neurological: She is alert and oriented to person, place, and time.  Psychiatric: Mood, memory, affect and judgment normal.    Assessment and Plan :  1. Essential hypertension Stable.  2. Palpitations Resolved on Metoprolol. Follow as needed.  3. Hyperglycemia A1C 5.8 better. Continue working on habits. - POCT HgB A1C  4. History of shingles Patient having itching I do not think this is shingles coming back but will provide Valtrex refill for patient. This is safe medication for me to give patient. - valACYclovir (VALTREX) 1000 MG tablet; Take 1 tablet (1,000 mg total) by mouth 3 (three)  times daily.  Dispense: 21 tablet; Refill: 2 5.Medial Epicondylitis Conservative therapy with pad on elbow to take pressure off. 6. Hot flashes Refill given. Stable. - venlafaxine (EFFEXOR) 75 MG tablet; Take 1 tablet (75 mg total) by mouth daily.  Dispense: 90 tablet; Refill: 3 Labs on the next visit.  HPI, Exam and A&P transcribed under direction and in the presence of Miguel Aschoff, MD.

## 2016-02-08 ENCOUNTER — Other Ambulatory Visit: Payer: Self-pay | Admitting: Family Medicine

## 2016-02-08 NOTE — Telephone Encounter (Signed)
Please review-aa 

## 2016-02-09 ENCOUNTER — Other Ambulatory Visit: Payer: Self-pay | Admitting: Obstetrics & Gynecology

## 2016-02-09 DIAGNOSIS — Z1231 Encounter for screening mammogram for malignant neoplasm of breast: Secondary | ICD-10-CM | POA: Diagnosis not present

## 2016-02-09 DIAGNOSIS — Z01419 Encounter for gynecological examination (general) (routine) without abnormal findings: Secondary | ICD-10-CM | POA: Diagnosis not present

## 2016-02-09 DIAGNOSIS — Z1211 Encounter for screening for malignant neoplasm of colon: Secondary | ICD-10-CM | POA: Diagnosis not present

## 2016-03-17 ENCOUNTER — Ambulatory Visit: Payer: Self-pay

## 2016-04-12 DIAGNOSIS — L719 Rosacea, unspecified: Secondary | ICD-10-CM | POA: Diagnosis not present

## 2016-04-12 DIAGNOSIS — L814 Other melanin hyperpigmentation: Secondary | ICD-10-CM | POA: Diagnosis not present

## 2016-04-18 ENCOUNTER — Ambulatory Visit
Admission: RE | Admit: 2016-04-18 | Discharge: 2016-04-18 | Disposition: A | Discharge status: Home / Self Care | Payer: Medicare Other | Source: Ambulatory Visit | Attending: Obstetrics & Gynecology | Admitting: Obstetrics & Gynecology

## 2016-04-18 DIAGNOSIS — Z1231 Encounter for screening mammogram for malignant neoplasm of breast: Secondary | ICD-10-CM | POA: Diagnosis present

## 2016-04-26 ENCOUNTER — Other Ambulatory Visit: Payer: Self-pay

## 2016-04-26 MED ORDER — PRAVASTATIN SODIUM 10 MG PO TABS: ORAL_TABLET | ORAL | 1 refills | Status: DC

## 2016-06-05 ENCOUNTER — Encounter: Payer: Self-pay | Admitting: Physician Assistant

## 2016-06-05 ENCOUNTER — Ambulatory Visit (INDEPENDENT_AMBULATORY_CARE_PROVIDER_SITE_OTHER): Payer: Medicare Other | Admitting: Physician Assistant

## 2016-06-05 VITALS — BP 128/70 | HR 67 | Temp 98.2°F | Resp 16 | Wt 187.4 lb

## 2016-06-05 DIAGNOSIS — R05 Cough: Secondary | ICD-10-CM | POA: Diagnosis not present

## 2016-06-05 DIAGNOSIS — R059 Cough, unspecified: Secondary | ICD-10-CM

## 2016-06-05 DIAGNOSIS — J0140 Acute pansinusitis, unspecified: Secondary | ICD-10-CM | POA: Diagnosis not present

## 2016-06-05 DIAGNOSIS — H66001 Acute suppurative otitis media without spontaneous rupture of ear drum, right ear: Secondary | ICD-10-CM

## 2016-06-05 MED ORDER — AZITHROMYCIN 250 MG PO TABS: ORAL_TABLET | ORAL | 0 refills | Status: DC

## 2016-06-05 MED ORDER — HYDROCODONE-HOMATROPINE 5-1.5 MG/5ML PO SYRP: 5.0000 mL | ORAL_SOLUTION | Freq: Three times a day (TID) | ORAL | 0 refills | Status: DC | PRN

## 2016-06-05 MED ORDER — NEOMYCIN-POLYMYXIN-HC 1 % OT SOLN: 3.0000 [drp] | Freq: Four times a day (QID) | OTIC | 0 refills | Status: DC

## 2016-06-05 NOTE — Progress Notes (Signed)
Patient: Virginia Phillips Female    DOB: 11-11-1945   71 y.o.   MRN: [id number] Visit Date: 06/05/2016  Today's Provider: Mar Daring, PA-C   Chief Complaint  Patient presents with  . URI   Subjective:    URI   This is a new problem. The current episode started 1 to 4 weeks ago. The problem has been gradually worsening. There has been no fever. Associated symptoms include congestion, coughing, ear pain (right ear), headaches, nausea, rhinorrhea, sinus pain, sneezing, vomiting (she vomit once) and wheezing. Pertinent negatives include no abdominal pain, chest pain or diarrhea. Associated symptoms comments: Lots of phlegm-brownish. She has tried increased fluids and decongestant (Cold Advil and Hydrocodon-Homatropin) for the symptoms. The treatment provided no relief.   Started easter sunday    Allergies  Allergen Reactions  . Amoxicillin Other (See Comments)    Sleepy  . Baby Oil   . Blue Dyes (Parenteral)   . Cortisone     Face purple  . Diphenhydramine Other (See Comments)  . Doxycycline Other (See Comments)  . Esomeprazole Other (See Comments)  . Iodinated Diagnostic Agents Other (See Comments)  . Nortriptyline Hcl     Migraine  . Other Other (See Comments)  . Sulfamethoxazole-Trimethoprim Other (See Comments)  . Tetracycline Other (See Comments)  . Levofloxacin Rash  . Prednisone Rash and Swelling  . Sulfa Antibiotics Rash     Current Outpatient Prescriptions:  .  aspirin 81 MG tablet, Take 1 tablet by mouth daily., Disp: , Rfl:  .  Bioflavonoid Products (VITAMIN C PLUS) 500 MG TABS, Take 1 tablet by mouth daily. , Disp: , Rfl:  .  Biotin 10 MG TABS, Take by mouth daily., Disp: , Rfl:  .  Chlorpheniramine-PSE-Ibuprofen 2-30-200 MG TABS, Take 1 tablet by mouth as needed. , Disp: , Rfl:  .  Cholecalciferol (VITAMIN D) 2000 UNITS CAPS, Take 4 capsules by mouth daily., Disp: , Rfl:  .  Coenzyme Q10 (CO Q10) 200 MG CAPS, Take 1 capsule by mouth 2 (two) times  daily., Disp: , Rfl:  .  cycloSPORINE (RESTASIS) 0.05 % ophthalmic emulsion, Apply to eye 2 (two) times daily., Disp: , Rfl:  .  glucose blood test strip, Check sugar once daily , DX R73.9-needs strips for One touch ultra 2, Disp: 50 each, Rfl: 12 .  Lancets (ONETOUCH ULTRASOFT) lancets, Check sugar once daily DX 73.9 needs for one touch ultra 2 lancets, Disp: 50 each, Rfl: 12 .  metoprolol succinate (TOPROL-XL) 25 MG 24 hr tablet, TAKE 1 TABLET(25 MG) BY MOUTH DAILY, Disp: 90 tablet, Rfl: 3 .  pravastatin (PRAVACHOL) 10 MG tablet, TAKE 1 TABLET(10 MG) BY MOUTH AT BEDTIME, Disp: 90 tablet, Rfl: 1 .  Probiotic Product (PROBIOTIC-10 PO), Take by mouth daily., Disp: , Rfl:  .  riboflavin (VITAMIN B-2) 100 MG TABS tablet, Take 1 tablet by mouth daily., Disp: , Rfl:  .  valACYclovir (VALTREX) 1000 MG tablet, Take 1 tablet (1,000 mg total) by mouth 3 (three) times daily., Disp: 21 tablet, Rfl: 2 .  venlafaxine (EFFEXOR) 75 MG tablet, Take 1 tablet (75 mg total) by mouth daily., Disp: 90 tablet, Rfl: 3 .  vitamin B-12 (CYANOCOBALAMIN) 100 MCG tablet, Take 100 mcg by mouth daily., Disp: , Rfl:  .  FLUZONE HIGH-DOSE 0.5 ML SUSY, ADM 0.5ML IM UTD, Disp: , Rfl: 0 .  Multiple Vitamins-Minerals (MULTIVITAMIN ADULT PO), Take by mouth. Takes Osteosheath, Immune complete, Petadolex vitamins daily., Disp: ,  Rfl:  .  triamcinolone cream (KENALOG) 0.1 %, Apply topically as needed. 1 Cream apply to affected area as needed (Patient not taking: Reported on 01/26/2016), Disp: 45 g, Rfl: 1  Review of Systems  Constitutional: Negative for chills and fever.  HENT: Positive for congestion, ear pain (right ear), postnasal drip, rhinorrhea, sinus pain, sinus pressure, sneezing and voice change.   Respiratory: Positive for cough, chest tightness, shortness of breath and wheezing.   Cardiovascular: Negative for chest pain, palpitations and leg swelling.  Gastrointestinal: Positive for nausea and vomiting (she vomit once).  Negative for abdominal pain and diarrhea.  Neurological: Positive for headaches. Negative for dizziness.    Social History  Substance Use Topics  . Smoking status: Never Smoker  . Smokeless tobacco: Never Used  . Alcohol use No   Objective:   BP 128/70 (BP Location: Right Arm, Patient Position: Sitting, Cuff Size: Normal)   Pulse 67   Temp 98.2 F (36.8 C) (Oral)   Resp 16   Wt 187 lb 6.4 oz (85 kg)   SpO2 96%   BMI 31.18 kg/m    Physical Exam  Constitutional: She appears well-developed and well-nourished. No distress.  HENT:  Head: Normocephalic and atraumatic.  Right Ear: Hearing, external ear and ear canal normal. Tympanic membrane is erythematous and bulging. Tympanic membrane is not perforated. A middle ear effusion is present.  Left Ear: Hearing, tympanic membrane, external ear and ear canal normal.  Nose: Right sinus exhibits maxillary sinus tenderness and frontal sinus tenderness. Left sinus exhibits maxillary sinus tenderness and frontal sinus tenderness.  Mouth/Throat: Uvula is midline, oropharynx is clear and moist and mucous membranes are normal. No oropharyngeal exudate.  Neck: Normal range of motion. Neck supple. No tracheal deviation present. No thyromegaly present.  Cardiovascular: Normal rate, regular rhythm and normal heart sounds.  Exam reveals no gallop and no friction rub.   No murmur heard. Pulmonary/Chest: Effort normal and breath sounds normal. No stridor. No respiratory distress. She has no wheezes. She has no rales.  Lymphadenopathy:    She has no cervical adenopathy.  Skin: She is not diaphoretic.  Vitals reviewed.     Assessment & Plan:     1. Acute pansinusitis, recurrence not specified Worsening symptoms that have not responded to OTC medications. Will give Zpak as below. Continue allergy medications. Stay well hydrated and get plenty of rest. Call if no symptom improvement or if symptoms worsen. -Azithromycin 252m Take 2 tablets PO on day 1,  then 1 tablet PO daily until completed; Quantity: 6 tablets; Refills: 0  2. Acute suppurative otitis media of right ear without spontaneous rupture of tympanic membrane, recurrence not specified Worsening. Cortisporin drops given as below. -Neomycin-Polymixin-Hydrocortisone 1% Otic solution Place 3 drops in right ear QID; Quantity: 10 mL; Refills: 0  3. Cough Worsening symptoms that has not responded to OTC medications. Will give Hycodan cough syrup as below for nighttime cough. Drowsiness precautions given to patient. Stay well hydrated. Use delsym, robitussin OR mucinex for daytime cough. -Hydrocodone-Homatropine 5-1.5 mg/525msyrup Take 5 mL PO q h.s. Quantity: 120 mL; Refills: 0      JeMar DaringPA-C  [facility]

## 2016-06-05 NOTE — Patient Instructions (Signed)
Otitis Media, Adult Otitis media occurs when there is inflammation and fluid in the middle ear. Your middle ear is a part of the ear that contains bones for hearing as well as air that helps send sounds to your brain. What are the causes? This condition is caused by a blockage in the eustachian tube. This tube drains fluid from the ear to the back of the nose (nasopharynx). A blockage in this tube can be caused by an object or by swelling (edema) in the tube. Problems that can cause a blockage include:  A cold or other upper respiratory infection.  Allergies.  An irritant, such as tobacco smoke.  Enlarged adenoids. The adenoids are areas of soft tissue located high in the back of the throat, behind the nose and the roof of the mouth.  A mass in the nasopharynx.  Damage to the ear caused by pressure changes (barotrauma). What are the signs or symptoms? Symptoms of this condition include:  Ear pain.  A fever.  Decreased hearing.  A headache.  Tiredness (lethargy).  Fluid leaking from the ear.  Ringing in the ear. How is this diagnosed? This condition is diagnosed with a physical exam. During the exam your health care provider will use an instrument called an otoscope to look into your ear and check for redness, swelling, and fluid. He or she will also ask about your symptoms. Your health care provider may also order tests, such as:  A test to check the movement of the eardrum (pneumatic otoscopy). This test is done by squeezing a small amount of air into the ear.  A test that changes air pressure in the middle ear to check how well the eardrum moves and whether the eustachian tube is working (tympanogram). How is this treated? This condition usually goes away on its own within 3-5 days. But if the condition is caused by a bacteria infection and does not go away own its own, or keeps coming back, your health care provider may:  Prescribe antibiotic medicines to treat the  infection.  Prescribe or recommend medicines to control pain. Follow these instructions at home:  Take over-the-counter and prescription medicines only as told by your health care provider.  If you were prescribed an antibiotic medicine, take it as told by your health care provider. Do not stop taking the antibiotic even if you start to feel better.  Keep all follow-up visits as told by your health care provider. This is important. Contact a health care provider if:  You have bleeding from your nose.  There is a lump on your neck.  You are not getting better in 5 days.  You feel worse instead of better. Get help right away if:  You have severe pain that is not controlled with medicine.  You have swelling, redness, or pain around your ear.  You have stiffness in your neck.  A part of your face is paralyzed.  The bone behind your ear (mastoid) is tender when you touch it.  You develop a severe headache. Summary  Otitis media is redness, soreness, and swelling of the middle ear.  This condition usually goes away on its own within 3-5 days.  If the problem does not go away in 3-5 days, your health care provider may prescribe or recommend medicines to treat your symptoms.  If you were prescribed an antibiotic medicine, take it as told by your health care provider. This information is not intended to replace advice given to you by your  health care provider. Make sure you discuss any questions you have with your health care provider. Document Released: 11/19/2003 Document Revised: 02/04/2016 Document Reviewed: 02/04/2016 Elsevier Interactive Patient Education  2017 [employer]. Sinusitis, Adult Sinusitis is soreness and inflammation of your sinuses. Sinuses are hollow spaces in the bones around your face. They are located:  Around your eyes.  In the middle of your forehead.  Behind your nose.  In your cheekbones. Your sinuses and nasal passages are lined with a  stringy fluid (mucus). Mucus normally drains out of your sinuses. When your nasal tissues get inflamed or swollen, the mucus can get trapped or blocked so air cannot flow through your sinuses. This lets bacteria, viruses, and funguses grow, and that leads to infection. Follow these instructions at home: Medicines   Take, use, or apply over-the-counter and prescription medicines only as told by your doctor. These may include nasal sprays.  If you were prescribed an antibiotic medicine, take it as told by your doctor. Do not stop taking the antibiotic even if you start to feel better. Hydrate and Humidify   Drink enough water to keep your pee (urine) clear or pale yellow.  Use a cool mist humidifier to keep the humidity level in your home above 50%.  Breathe in steam for 10-15 minutes, 3-4 times a day or as told by your doctor. You can do this in the bathroom while a hot shower is running.  Try not to spend time in cool or dry air. Rest   Rest as much as possible.  Sleep with your head raised (elevated).  Make sure to get enough sleep each night. General instructions   Put a warm, moist washcloth on your face 3-4 times a day or as told by your doctor. This will help with discomfort.  Wash your hands often with soap and water. If there is no soap and water, use hand sanitizer.  Do not smoke. Avoid being around people who are smoking (secondhand smoke).  Keep all follow-up visits as told by your doctor. This is important. Contact a doctor if:  You have a fever.  Your symptoms get worse.  Your symptoms do not get better within 10 days. Get help right away if:  You have a very bad headache.  You cannot stop throwing up (vomiting).  You have pain or swelling around your face or eyes.  You have trouble seeing.  You feel confused.  Your neck is stiff.  You have trouble breathing. This information is not intended to replace advice given to you by your health care provider.  Make sure you discuss any questions you have with your health care provider. Document Released: 08/02/2007 Document Revised: 10/10/2015 Document Reviewed: 12/09/2014 Elsevier Interactive Patient Education  2017 [employer].

## 2016-06-08 ENCOUNTER — Ambulatory Visit (INDEPENDENT_AMBULATORY_CARE_PROVIDER_SITE_OTHER): Payer: Medicare Other | Admitting: Family Medicine

## 2016-06-08 ENCOUNTER — Ambulatory Visit: Payer: Medicare Other | Admitting: Family Medicine

## 2016-06-08 VITALS — BP 104/68 | HR 68 | Temp 98.5°F | Resp 18 | Wt 187.0 lb

## 2016-06-08 DIAGNOSIS — H66001 Acute suppurative otitis media without spontaneous rupture of ear drum, right ear: Secondary | ICD-10-CM

## 2016-06-08 DIAGNOSIS — J0140 Acute pansinusitis, unspecified: Secondary | ICD-10-CM | POA: Diagnosis not present

## 2016-06-08 DIAGNOSIS — R059 Cough, unspecified: Secondary | ICD-10-CM

## 2016-06-08 DIAGNOSIS — R05 Cough: Secondary | ICD-10-CM

## 2016-06-08 MED ORDER — HYDROCODONE-HOMATROPINE 5-1.5 MG/5ML PO SYRP: 5.0000 mL | ORAL_SOLUTION | Freq: Three times a day (TID) | ORAL | 0 refills | Status: DC | PRN

## 2016-06-08 MED ORDER — AZITHROMYCIN 250 MG PO TABS: ORAL_TABLET | ORAL | 0 refills | Status: DC

## 2016-06-08 NOTE — Progress Notes (Signed)
Virginia Phillips  MRN: [id number] DOB: [birth year]  Subjective:  HPI  Patient is here to discuss cough and ear issue. Virginia Phillips saw Fenton Malling on 06/05/16 and was diagnosed with otits media on the right and sinusitis. Patient was started on Zpak (Virginia Phillips has 1 tablet left to take), hycodan syrup and ear drops. Virginia Phillips is taking cough syrup along with Fisherman's friend-menthol cough suppressant OTC. Virginia Phillips used ear drops 3 times and it made her feel worse so Virginia Phillips stopped. Virginia Phillips is still coughing-productive cough with grey phlegm, hoarse, post nasal drip present. No fever. Her head feels full. Feels numb on the right side of her head, right ear feels like a "small balloon blowing up in there."  Virginia Phillips has also taking Advil for cold and sinuses. Patient Active Problem List   Diagnosis Date Noted  . Cerebrovascular accident (CVA) ([place]) 12/25/2014  . Shingles 12/25/2014  . Acid reflux 08/03/2014  . 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 Cerebral vascular accident ([place]) 08/03/2014  . Allergic rhinitis 07/31/2014  . 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 Atrophic vaginitis 07/31/2014  . Atypical chest pain 07/31/2014  . Blood pressure elevated without history of HTN 07/31/2014  . Chest pain 07/31/2014  . CN (constipation) 07/31/2014  . Deep vein thrombosis ([place]) 07/31/2014  . Concussion injury of body structure 07/31/2014  . Elevated hemoglobin ([place]) 07/31/2014  . Fatigue 07/31/2014  . Hemorrhoid 07/31/2014  . Calcium blood increased 07/31/2014  . Hyperlipidemia 07/31/2014  . Blood glucose elevated 07/31/2014  . Leg swelling 07/31/2014  . Lichen sclerosus 68/12/5724  . NASH (nonalcoholic steatohepatitis) 07/31/2014  . Headache, migraine 07/31/2014  . Burning or prickling sensation 07/31/2014  . 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 Lung mass 07/31/2014  . Abdominal pain, right upper quadrant 07/31/2014  . 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 Apnea, sleep 07/31/2014  .  Avitaminosis D 07/31/2014  . Cephalalgia 10/21/2013  . Abnormal gait 09/24/2013  . H/O head injury 09/24/2013  . Temporary cerebral vascular dysfunction 11/15/1996    No past medical history on file.  Social History   Social History  . Marital status: Married    Spouse name: N/A  . Number of children: N/A  . Years of education: N/A   Occupational History  . Not on file.   Social History Main Topics  . Smoking status: Never Smoker  . Smokeless tobacco: Never Used  . Alcohol use No  . Drug use: No  . Sexual activity: Not on file   Other Topics Concern  . Not on file   Social History Narrative  . No narrative on file    Outpatient Encounter Prescriptions as of 06/08/2016  Medication Sig Note  . aspirin 81 MG tablet Take 1 tablet by mouth daily. 07/31/2014: Received from: [facility]:   . azithromycin (ZITHROMAX) 250 MG tablet Take 2 tablets PO on day one, and one tablet PO daily thereafter until completed.   Marland Kitchen Bioflavonoid Products (VITAMIN C PLUS) 500 MG TABS Take 1 tablet by mouth daily.  07/31/2014: Received from: [place]:   . Biotin 10 MG TABS Take by mouth daily.   . Chlorpheniramine-PSE-Ibuprofen 2-30-200 MG TABS Take 1 tablet by mouth as needed.  07/31/2014: Received from: [facility]  . Cholecalciferol (VITAMIN D) 2000 UNITS CAPS Take 4 capsules by mouth daily. 07/31/2014: Received from: [place]:   . Coenzyme Q10 (CO Q10) 200 MG CAPS Take 1 capsule  by mouth 2 (two) times daily. 07/31/2014: Received from: [place]:   . cycloSPORINE (RESTASIS) 0.05 % ophthalmic emulsion Apply to eye 2 (two) times daily. 07/31/2014: Received from: [place]:   . FLUZONE HIGH-DOSE 0.5 ML SUSY ADM 0.5ML IM UTD 01/26/2016: Received from: External Pharmacy  . glucose blood test strip Check sugar once daily , DX  R73.9-needs strips for One touch ultra 2   . HYDROcodone-homatropine (HYCODAN) 5-1.5 MG/5ML syrup Take 5 mLs by mouth every 8 (eight) hours as needed for cough.   . Lancets (ONETOUCH ULTRASOFT) lancets Check sugar once daily DX 73.9 needs for one touch ultra 2 lancets   . metoprolol succinate (TOPROL-XL) 25 MG 24 hr tablet TAKE 1 TABLET(25 MG) BY MOUTH DAILY   . pravastatin (PRAVACHOL) 10 MG tablet TAKE 1 TABLET(10 MG) BY MOUTH AT BEDTIME   . Probiotic Product (PROBIOTIC-10 PO) Take by mouth daily.   . riboflavin (VITAMIN B-2) 100 MG TABS tablet Take 1 tablet by mouth daily. 08/03/2014: Received from: [facility]:   . triamcinolone cream (KENALOG) 0.1 % Apply topically as needed. 1 Cream apply to affected area as needed 10/25/2015: prn  . valACYclovir (VALTREX) 1000 MG tablet Take 1 tablet (1,000 mg total) by mouth 3 (three) times daily.   Marland Kitchen venlafaxine (EFFEXOR) 75 MG tablet Take 1 tablet (75 mg total) by mouth daily.   . vitamin B-12 (CYANOCOBALAMIN) 100 MCG tablet Take 100 mcg by mouth daily.   . [DISCONTINUED] Multiple Vitamins-Minerals (MULTIVITAMIN ADULT PO) Take by mouth. Takes Osteosheath, Immune complete, Petadolex vitamins daily.   . [DISCONTINUED] NEOMYCIN-POLYMYXIN-HYDROCORTISONE (CORTISPORIN) 1 % SOLN otic solution Place 3 drops into the right ear 4 (four) times daily.    No facility-administered encounter medications on file as of 06/08/2016.     Allergies  Allergen Reactions  . Amoxicillin Other (See Comments)    Sleepy  . Baby Oil   . Blue Dyes (Parenteral)   . Cortisone     Face purple  . Diphenhydramine Other (See Comments)  . Doxycycline Other (See Comments)  . Esomeprazole Other (See Comments)  . Iodinated Diagnostic Agents Other (See Comments)  . Nortriptyline Hcl     Migraine  . Other Other (See Comments)  . Sulfamethoxazole-Trimethoprim Other (See Comments)  . Tetracycline Other (See Comments)  . Levofloxacin Rash  . Prednisone Rash  and Swelling  . Sulfa Antibiotics Rash    Review of Systems  Constitutional: Positive for malaise/fatigue. Negative for chills and fever.  HENT: Positive for congestion.        Head pressure, ear fullness. Hoarse, post nasal drip.  Respiratory: Positive for cough and sputum production.   Cardiovascular: Negative.   Gastrointestinal: Negative.   Musculoskeletal: Negative.   Neurological: Positive for dizziness.  Endo/Heme/Allergies: Negative.   Psychiatric/Behavioral: Negative.     Objective:  BP 104/68   Pulse 68   Temp 98.5 F (36.9 C)   Resp 18   Wt 187 lb (84.8 kg)   SpO2 95%   BMI 31.12 kg/m   Physical Exam  Constitutional: Virginia Phillips is oriented to person, place, and time and well-developed, well-nourished, and in no distress.  HENT:  Head: Normocephalic and atraumatic.  Right Ear: External ear normal. Tympanic membrane is erythematous.  Left Ear: Hearing, tympanic membrane, external ear and ear canal normal.  Nose: Nose normal.  Eyes: Conjunctivae are normal. Pupils are equal, round, and reactive to light.  Neck: Normal range of motion.  Neck supple.  Cardiovascular: Normal rate, regular rhythm, normal heart sounds and intact distal pulses.   No murmur heard. Pulmonary/Chest: Effort normal and breath sounds normal. No respiratory distress. Virginia Phillips has no wheezes.  Abdominal: Soft.  Neurological: Virginia Phillips is alert and oriented to person, place, and time. Gait normal. GCS score is 15.  Skin: Skin is warm and dry.  Psychiatric: Mood, memory, affect and judgment normal.   Assessment and Plan :  1. Acute suppurative otitis media of right ear without spontaneous rupture of tympanic membrane, recurrence not specified Will refill Zpak. Ear drops were not effective. Follow as needed.  2. Cough Due to so many allergies I am limited on treatment for the patient. Will extend Zpak treatment for a few more days. May need chest xray. Refilled Hycodan syrup today. Follow as needed. -  azithromycin (ZITHROMAX) 250 MG tablet; Take 2 tablets PO on day one, and one tablet PO daily thereafter until completed.  Dispense: 6 tablet; Refill: 0 Multiple Drug Allergies  HPI, Exam and A&P transcribed by Theressa Millard, RMA under direction and in the presence of Miguel Aschoff, MD. I have done the exam and reviewed the chart and it is accurate to the best of my knowledge. Development worker, community has been used and  any errors in dictation or transcription are unintentional. Miguel Aschoff M.D. [facility]

## 2016-06-09 ENCOUNTER — Telehealth: Payer: Self-pay | Admitting: Emergency Medicine

## 2016-06-09 DIAGNOSIS — R112 Nausea with vomiting, unspecified: Secondary | ICD-10-CM

## 2016-06-09 MED ORDER — PROMETHAZINE HCL 12.5 MG PO TABS: 12.5000 mg | ORAL_TABLET | Freq: Three times a day (TID) | ORAL | 0 refills | Status: DC | PRN

## 2016-06-09 NOTE — Telephone Encounter (Signed)
Pt is calling back today after being seen by you and Dr. Rosanna Randy. She is now having nausea and she is concerned about throwing up because she had surgery to repair a   sliding hiatal hernia per the surgeon. She says that she is nauseated because she is coughing so much and gaging. She could like something for nausea called in. Please advise. Thanks.     [facility] [address]

## 2016-06-09 NOTE — Telephone Encounter (Signed)
Phenergan sent to [facility]

## 2016-06-09 NOTE — Telephone Encounter (Signed)
Pt advised. Renaldo Fiddler, CMA

## 2016-06-13 DIAGNOSIS — J209 Acute bronchitis, unspecified: Secondary | ICD-10-CM | POA: Diagnosis not present

## 2016-06-13 DIAGNOSIS — H9201 Otalgia, right ear: Secondary | ICD-10-CM | POA: Diagnosis not present

## 2016-06-13 DIAGNOSIS — R05 Cough: Secondary | ICD-10-CM | POA: Diagnosis not present

## 2016-07-05 DIAGNOSIS — J209 Acute bronchitis, unspecified: Secondary | ICD-10-CM | POA: Diagnosis not present

## 2016-07-17 DIAGNOSIS — J301 Allergic rhinitis due to pollen: Secondary | ICD-10-CM | POA: Diagnosis not present

## 2016-07-17 DIAGNOSIS — R05 Cough: Secondary | ICD-10-CM | POA: Diagnosis not present

## 2016-07-17 DIAGNOSIS — H9209 Otalgia, unspecified ear: Secondary | ICD-10-CM | POA: Diagnosis not present

## 2016-07-18 DIAGNOSIS — H401123 Primary open-angle glaucoma, left eye, severe stage: Secondary | ICD-10-CM | POA: Diagnosis not present

## 2016-07-25 DIAGNOSIS — J301 Allergic rhinitis due to pollen: Secondary | ICD-10-CM | POA: Diagnosis not present

## 2016-10-24 ENCOUNTER — Ambulatory Visit (INDEPENDENT_AMBULATORY_CARE_PROVIDER_SITE_OTHER): Payer: Medicare Other | Admitting: Family Medicine

## 2016-10-24 ENCOUNTER — Encounter: Payer: Self-pay | Admitting: Family Medicine

## 2016-10-24 VITALS — BP 108/62 | HR 68 | Temp 97.5°F | Resp 16 | Wt 188.0 lb

## 2016-10-24 DIAGNOSIS — R201 Hypoesthesia of skin: Secondary | ICD-10-CM

## 2016-10-24 DIAGNOSIS — B029 Zoster without complications: Secondary | ICD-10-CM | POA: Diagnosis not present

## 2016-10-24 DIAGNOSIS — R202 Paresthesia of skin: Secondary | ICD-10-CM

## 2016-10-24 DIAGNOSIS — R079 Chest pain, unspecified: Secondary | ICD-10-CM

## 2016-10-24 MED ORDER — VALACYCLOVIR HCL 1 G PO TABS: 1000.0000 mg | ORAL_TABLET | Freq: Three times a day (TID) | ORAL | 0 refills | Status: DC

## 2016-10-24 NOTE — Progress Notes (Signed)
Patient: Virginia Phillips Female    DOB: Jan 07, 1946   71 y.o.   MRN: [id number] Visit Date: 10/24/2016  Today's Provider: Wilhemena Durie, MD   Chief Complaint  Patient presents with  . Tingling    in lips   Subjective:    HPI Pt reports that she has been having tingling in her lips. She reports that it has been going on for about 2 months. She remembers that when she had her stroke before she was having tingling in her lips in the beginning. She has been having a lot of stress in her life. She reports that she also has numbness and tingling in her feet, legs and hands. She reports that sometimes when gets up in the mornings her legs are numb. She reports that she has had some lightheadedness within the last month. She reports that she has had some chest pain mid chest and around her breast.  No associated symptoms at all.     Allergies  Allergen Reactions  . Amoxicillin Other (See Comments)    Sleepy  . Baby Oil   . Blue Dyes (Parenteral)   . Cortisone     Face purple  . Diphenhydramine Other (See Comments)  . Doxycycline Other (See Comments)  . Esomeprazole Other (See Comments)  . Iodinated Diagnostic Agents Other (See Comments)  . Metronidazole     Redness of the face  . Nortriptyline Hcl     Migraine  . Other Other (See Comments)  . Sulfamethoxazole-Trimethoprim Other (See Comments)  . Tetracycline Other (See Comments)  . Levofloxacin Rash  . Prednisone Rash and Swelling  . Sulfa Antibiotics Rash     Current Outpatient Prescriptions:  .  aspirin 81 MG tablet, Take 1 tablet by mouth daily., Disp: , Rfl:  .  azithromycin (ZITHROMAX) 250 MG tablet, Take 2 tablets PO on day one, and one tablet PO daily thereafter until completed., Disp: 6 tablet, Rfl: 0 .  Bioflavonoid Products (VITAMIN C PLUS) 500 MG TABS, Take 1 tablet by mouth daily. , Disp: , Rfl:  .  Biotin 10 MG TABS, Take by mouth daily., Disp: , Rfl:  .  Chlorpheniramine-PSE-Ibuprofen 2-30-200 MG TABS, Take  1 tablet by mouth as needed. , Disp: , Rfl:  .  Cholecalciferol (VITAMIN D) 2000 UNITS CAPS, Take 4 capsules by mouth daily., Disp: , Rfl:  .  Coenzyme Q10 (CO Q10) 200 MG CAPS, Take 1 capsule by mouth 2 (two) times daily., Disp: , Rfl:  .  cycloSPORINE (RESTASIS) 0.05 % ophthalmic emulsion, Apply to eye 2 (two) times daily., Disp: , Rfl:  .  FLUZONE HIGH-DOSE 0.5 ML SUSY, ADM 0.5ML IM UTD, Disp: , Rfl: 0 .  glucose blood test strip, Check sugar once daily , DX R73.9-needs strips for One touch ultra 2, Disp: 50 each, Rfl: 12 .  HYDROcodone-homatropine (HYCODAN) 5-1.5 MG/5ML syrup, Take 5 mLs by mouth every 8 (eight) hours as needed for cough., Disp: 120 mL, Rfl: 0 .  Lancets (ONETOUCH ULTRASOFT) lancets, Check sugar once daily DX 73.9 needs for one touch ultra 2 lancets, Disp: 50 each, Rfl: 12 .  metoprolol succinate (TOPROL-XL) 25 MG 24 hr tablet, TAKE 1 TABLET(25 MG) BY MOUTH DAILY, Disp: 90 tablet, Rfl: 3 .  Petasin (PETADOLEX PO), Take by mouth daily., Disp: , Rfl:  .  pravastatin (PRAVACHOL) 10 MG tablet, TAKE 1 TABLET(10 MG) BY MOUTH AT BEDTIME, Disp: 90 tablet, Rfl: 1 .  Probiotic Product (PROBIOTIC-10 PO), Take  by mouth daily., Disp: , Rfl:  .  promethazine (PHENERGAN) 12.5 MG tablet, Take 1 tablet (12.5 mg total) by mouth every 8 (eight) hours as needed for nausea or vomiting., Disp: 20 tablet, Rfl: 0 .  riboflavin (VITAMIN B-2) 100 MG TABS tablet, Take 1 tablet by mouth daily., Disp: , Rfl:  .  triamcinolone cream (KENALOG) 0.1 %, Apply topically as needed. 1 Cream apply to affected area as needed, Disp: 45 g, Rfl: 1 .  valACYclovir (VALTREX) 1000 MG tablet, Take 1 tablet (1,000 mg total) by mouth 3 (three) times daily., Disp: 21 tablet, Rfl: 2 .  venlafaxine (EFFEXOR) 75 MG tablet, Take 1 tablet (75 mg total) by mouth daily., Disp: 90 tablet, Rfl: 3 .  vitamin B-12 (CYANOCOBALAMIN) 100 MCG tablet, Take 100 mcg by mouth daily., Disp: , Rfl:   Review of Systems  HENT: Negative.   Eyes:  Negative.   Respiratory: Negative.   Cardiovascular: Positive for chest pain.  Gastrointestinal: Negative.   Endocrine: Negative.   Genitourinary: Negative.   Musculoskeletal: Negative.   Skin: Negative.   Allergic/Immunologic: Negative.   Neurological: Positive for weakness and numbness.  Hematological: Negative.   Psychiatric/Behavioral: Negative.     Social History  Substance Use Topics  . Smoking status: Never Smoker  . Smokeless tobacco: Never Used  . Alcohol use No   Objective:   BP 108/62 (BP Location: Left Arm, Patient Position: Sitting, Cuff Size: Large)   Pulse 68   Temp (!) 97.5 F (36.4 C) (Oral)   Resp 16   Wt 188 lb (85.3 kg)   SpO2 96%   BMI 31.28 kg/m  Vitals:   10/24/16 1602  BP: 108/62  Pulse: 68  Resp: 16  Temp: (!) 97.5 F (36.4 C)  TempSrc: Oral  SpO2: 96%  Weight: 188 lb (85.3 kg)     Physical Exam  Constitutional: She is oriented to person, place, and time. She appears well-developed and well-nourished.  HENT:  Head: Normocephalic and atraumatic.  Right Ear: External ear normal.  Left Ear: External ear normal.  Nose: Nose normal.  Eyes: Pupils are equal, round, and reactive to light. Conjunctivae and EOM are normal.  Neck: Normal range of motion. Neck supple.  Cardiovascular: Normal rate, regular rhythm, normal heart sounds and intact distal pulses.   Pulmonary/Chest: Effort normal and breath sounds normal.  Tenderness in lower left chest wall below breast which reproduces pain.  Abdominal: Soft.  Musculoskeletal: Normal range of motion.  Neurological: She is alert and oriented to person, place, and time. She has normal reflexes. No cranial nerve deficit.  Skin: Skin is warm and dry.  Psychiatric: She has a normal mood and affect. Her behavior is normal. Judgment and thought content normal.        Assessment & Plan:      1. Tingling of face   2. Chest pain, unspecified type Noncardiac. Costochondritis likely. - EKG  12-Lead  3. Herpes zoster without complication Needs derm referral at some point. - valACYclovir (VALTREX) 1000 MG tablet; Take 1 tablet (1,000 mg total) by mouth 3 (three) times daily.  Dispense: 21 tablet; Refill: 0 4.Anxiety 5.h/o TIA 1996    HPI, Exam, and A&P Transcribed under the direction and in the presence of Kariel Skillman L. Cranford Mon, MD  Electronically Signed: Katina Dung, CMA I have done the exam and reviewed the chart and it is accurate to the best of my knowledge. Development worker, community has been used and  any errors in  dictation or transcription are unintentional. Miguel Aschoff M.D. [place], MD  [facility]

## 2016-10-28 ENCOUNTER — Other Ambulatory Visit: Payer: Self-pay | Admitting: Physician Assistant

## 2016-11-11 ENCOUNTER — Other Ambulatory Visit: Payer: Self-pay | Admitting: Family Medicine

## 2016-11-13 ENCOUNTER — Telehealth: Payer: Self-pay | Admitting: Physician Assistant

## 2016-11-13 NOTE — Telephone Encounter (Signed)
Caryl Pina at [facility] called saying our provider needs to call [facility] and change the diagnosis code on the form to the correct one.    This is for the One Touch Ultra test strips  Thanks Con Memos

## 2016-11-15 ENCOUNTER — Ambulatory Visit (INDEPENDENT_AMBULATORY_CARE_PROVIDER_SITE_OTHER): Payer: Medicare Other | Admitting: Family Medicine

## 2016-11-15 VITALS — BP 102/80 | HR 88 | Temp 97.6°F | Resp 14 | Wt 185.0 lb

## 2016-11-15 DIAGNOSIS — R739 Hyperglycemia, unspecified: Secondary | ICD-10-CM | POA: Diagnosis not present

## 2016-11-15 DIAGNOSIS — F3289 Other specified depressive episodes: Secondary | ICD-10-CM | POA: Diagnosis not present

## 2016-11-15 DIAGNOSIS — Z1159 Encounter for screening for other viral diseases: Secondary | ICD-10-CM | POA: Diagnosis not present

## 2016-11-15 DIAGNOSIS — E784 Other hyperlipidemia: Secondary | ICD-10-CM

## 2016-11-15 DIAGNOSIS — Z23 Encounter for immunization: Secondary | ICD-10-CM | POA: Diagnosis not present

## 2016-11-15 DIAGNOSIS — E7849 Other hyperlipidemia: Secondary | ICD-10-CM

## 2016-11-15 LAB — POCT GLYCOSYLATED HEMOGLOBIN (HGB A1C): Hemoglobin A1C: 5.7

## 2016-11-15 NOTE — Progress Notes (Signed)
Virginia Phillips  MRN: [id number] DOB: September 22, 1945  Subjective:  HPI  Patient is here wanting to get her A1C checked. Last check was in November 2017. Last lab work done was on 06/02/15.  Lab Results  Component Value Date   HGBA1C 5.8 01/26/2016   Wt Readings from Last 3 Encounters:  11/15/16 185 lb (83.9 kg)  10/24/16 188 lb (85.3 kg)  06/08/16 187 lb (84.8 kg)   Patient Active Problem List   Diagnosis Date Noted  . Cerebrovascular accident (CVA) ([place]) 12/25/2014  . Shingles 12/25/2014  . Acid reflux 08/03/2014  . 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 Cerebral vascular accident ([place]) 08/03/2014  . Allergic rhinitis 07/31/2014  . 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 Atrophic vaginitis 07/31/2014  . Atypical chest pain 07/31/2014  . Blood pressure elevated without history of HTN 07/31/2014  . Chest pain 07/31/2014  . CN (constipation) 07/31/2014  . Deep vein thrombosis ([place]) 07/31/2014  . Concussion injury of body structure 07/31/2014  . Elevated hemoglobin ([place]) 07/31/2014  . Fatigue 07/31/2014  . Hemorrhoid 07/31/2014  . Calcium blood increased 07/31/2014  . Hyperlipidemia 07/31/2014  . Blood glucose elevated 07/31/2014  . Leg swelling 07/31/2014  . Lichen sclerosus 17/51/0258  . NASH (nonalcoholic steatohepatitis) 07/31/2014  . Headache, migraine 07/31/2014  . Burning or prickling sensation 07/31/2014  . 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 Lung mass 07/31/2014  . Abdominal pain, right upper quadrant 07/31/2014  . 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 Apnea, sleep 07/31/2014  . Avitaminosis D 07/31/2014  . Cephalalgia 10/21/2013  . Abnormal gait 09/24/2013  . H/O head injury 09/24/2013  . Temporary cerebral vascular dysfunction 11/15/1996    No past medical history on file.  Social History   Social History  . Marital status: Married    Spouse name: N/A  . Number of children: N/A  . Years of education: N/A   Occupational History    . Not on file.   Social History Main Topics  . Smoking status: Never Smoker  . Smokeless tobacco: Never Used  . Alcohol use No  . Drug use: No  . Sexual activity: Not on file   Other Topics Concern  . Not on file   Social History Narrative  . No narrative on file    Outpatient Encounter Prescriptions as of 11/15/2016  Medication Sig Note  . aspirin 81 MG tablet Take 1 tablet by mouth daily. 07/31/2014: Received from: [facility]:   . Bioflavonoid Products (VITAMIN C PLUS) 500 MG TABS Take 1 tablet by mouth daily.  07/31/2014: Received from: [facility]:   . Biotin 10 MG TABS Take by mouth daily.   . Chlorpheniramine-PSE-Ibuprofen 2-30-200 MG TABS Take 1 tablet by mouth as needed.  07/31/2014: Received from: [facility]  . Cholecalciferol (VITAMIN D) 2000 UNITS CAPS Take 4 capsules by mouth daily. 07/31/2014: Received from: [facility]:   . Coenzyme Q10 (CO Q10) 200 MG CAPS Take 1 capsule by mouth 2 (two) times daily. 07/31/2014: Received from: [facility]:   . cycloSPORINE (RESTASIS) 0.05 % ophthalmic emulsion Apply to eye 2 (two) times daily. 07/31/2014: Received from: [facility]:   . Lancets (ONETOUCH ULTRASOFT) lancets Check sugar once daily DX 73.9 needs for one touch ultra 2 lancets   . metoprolol succinate (TOPROL-XL) 25 MG 24 hr tablet TAKE 1 TABLET(25 MG) BY MOUTH DAILY   .  ONE TOUCH ULTRA TEST test strip CHECK BLOOD SUGAR ONCE DAILY AS DIRECTED   . Petasin (PETADOLEX PO) Take by mouth daily.   . pravastatin (PRAVACHOL) 10 MG tablet TAKE 1 TABLET(10 MG) BY MOUTH AT BEDTIME   . Probiotic Product (PROBIOTIC-10 PO) Take by mouth daily.   . riboflavin (VITAMIN B-2) 100 MG TABS tablet Take 1 tablet by mouth daily. 08/03/2014: Received from: [place]:   . triamcinolone cream (KENALOG) 0.1 %  Apply topically as needed. 1 Cream apply to affected area as needed 10/25/2015: prn  . venlafaxine (EFFEXOR) 75 MG tablet Take 1 tablet (75 mg total) by mouth daily.   . vitamin B-12 (CYANOCOBALAMIN) 100 MCG tablet Take 100 mcg by mouth daily.   . valACYclovir (VALTREX) 1000 MG tablet Take 1 tablet (1,000 mg total) by mouth 3 (three) times daily. (Patient not taking: Reported on 11/15/2016)   . [DISCONTINUED] promethazine (PHENERGAN) 12.5 MG tablet Take 1 tablet (12.5 mg total) by mouth every 8 (eight) hours as needed for nausea or vomiting. (Patient not taking: Reported on 10/24/2016)    No facility-administered encounter medications on file as of 11/15/2016.     Allergies  Allergen Reactions  . Amoxicillin Other (See Comments)    Sleepy  . Baby Oil   . Blue Dyes (Parenteral)   . Cortisone     Face purple  . Diphenhydramine Other (See Comments)  . Doxycycline Other (See Comments)  . Esomeprazole Other (See Comments)  . Iodinated Diagnostic Agents Other (See Comments)  . Metronidazole     Redness of the face  . Nortriptyline Hcl     Migraine  . Other Other (See Comments)  . Sulfamethoxazole-Trimethoprim Other (See Comments)  . Tetracycline Other (See Comments)  . Levofloxacin Rash  . Prednisone Rash and Swelling  . Sulfa Antibiotics Rash    Review of Systems  Constitutional: Negative.   Respiratory: Negative.   Cardiovascular: Negative.   Gastrointestinal: Negative.   Musculoskeletal: Negative.   Neurological: Negative.     Objective:  BP 102/80   Pulse 88   Temp 97.6 F (36.4 C)   Resp 14   Wt 185 lb (83.9 kg)   BMI 30.79 kg/m   Physical Exam  Constitutional: She is oriented to person, place, and time and well-developed, well-nourished, and in no distress.  HENT:  Head: Normocephalic and atraumatic.  Eyes: Pupils are equal, round, and reactive to light. Conjunctivae are normal.  Cardiovascular: Normal rate, regular rhythm, normal heart sounds and intact distal  pulses.  Exam reveals no gallop.   No murmur heard. Pulmonary/Chest: Effort normal and breath sounds normal. No respiratory distress. She has no wheezes.  Neurological: She is alert and oriented to person, place, and time.  Psychiatric: Mood, memory, affect and judgment normal.   Assessment and Plan :  1. Hyperglycemia 5.7. Good. Patient advised she is not diabetic. Patient advised she does not need to check her sugars at home unless she wants to. Work on habits. - POCT HgB A1C  2. Other depression Stable. - TSH  3. Other hyperlipidemia Check lab work. - CBC with Differential/Platelet - Comprehensive metabolic panel - Lipid Profile  4. Chronic anxiety  I have done the exam and reviewed the chart and it is accurate to the best of my knowledge. Development worker, community has been used and  any errors in dictation or transcription are unintentional. Miguel Aschoff M.D. [facility]  4. Need for immunization  against influenza - Flu vaccine HIGH DOSE PF (Fluzone High dose)  5. Need for hepatitis C screening test - Hepatitis C Antibody  HPI, Exam and A&P transcribed by Tiffany Kocher, RMA under direction and in the presence of Miguel Aschoff, MD.

## 2016-11-15 NOTE — Telephone Encounter (Signed)
Spoke with Virginia Phillips at [facility] she said she spoke to Virginia Phillips and they said diagnoses code for her sugar needed to be changed-probably due to it not been diabetic code but just hyperglycemia, patient is not diabetic per our records yet. Will discuss this with patient today during her office visit-Virginia Phillips, RMA

## 2016-12-14 DIAGNOSIS — Z1159 Encounter for screening for other viral diseases: Secondary | ICD-10-CM | POA: Diagnosis not present

## 2016-12-14 DIAGNOSIS — E7849 Other hyperlipidemia: Secondary | ICD-10-CM | POA: Diagnosis not present

## 2016-12-14 DIAGNOSIS — F3289 Other specified depressive episodes: Secondary | ICD-10-CM | POA: Diagnosis not present

## 2016-12-15 LAB — HEPATITIS C ANTIBODY
Hepatitis C Ab: NONREACTIVE
SIGNAL TO CUT-OFF: 0.01 (ref <1.00)

## 2016-12-15 LAB — CBC WITH DIFFERENTIAL/PLATELET
Basophils Absolute: 57 cells/uL (ref 0–200)
Basophils Relative: 0.9 %
Eosinophils Absolute: 132 cells/uL (ref 15–500)
Eosinophils Relative: 2.1 %
HCT: 40.6 % (ref 35.0–45.0)
Hemoglobin: 13.9 g/dL (ref 11.7–15.5)
Lymphs Abs: 2533 cells/uL (ref 850–3900)
MCH: 31.9 pg (ref 27.0–33.0)
MCHC: 34.2 g/dL (ref 32.0–36.0)
MCV: 93.1 fL (ref 80.0–100.0)
MPV: 11.5 fL (ref 7.5–12.5)
Monocytes Relative: 8.4 %
Neutro Abs: 3049 cells/uL (ref 1500–7800)
Neutrophils Relative %: 48.4 %
Platelets: 299 10*3/uL (ref 140–400)
RBC: 4.36 10*6/uL (ref 3.80–5.10)
RDW: 12.5 % (ref 11.0–15.0)
Total Lymphocyte: 40.2 %
WBC mixed population: 529 cells/uL (ref 200–950)
WBC: 6.3 10*3/uL (ref 3.8–10.8)

## 2016-12-15 LAB — LIPID PANEL
Cholesterol: 178 mg/dL (ref <200)
HDL: 61 mg/dL (ref >50)
LDL Cholesterol (Calc): 96 mg/dL (calc)
Non-HDL Cholesterol (Calc): 117 mg/dL (calc) (ref <130)
Total CHOL/HDL Ratio: 2.9 (calc) (ref <5.0)
Triglycerides: 114 mg/dL (ref <150)

## 2016-12-15 LAB — COMPLETE METABOLIC PANEL WITH GFR
AG Ratio: 1.8 (calc) (ref 1.0–2.5)
ALT: 22 U/L (ref 6–29)
AST: 21 U/L (ref 10–35)
Albumin: 4.1 g/dL (ref 3.6–5.1)
Alkaline phosphatase (APISO): 49 U/L (ref 33–130)
BUN: 14 mg/dL (ref 7–25)
CO2: 26 mmol/L (ref 20–32)
Calcium: 9.1 mg/dL (ref 8.6–10.4)
Chloride: 106 mmol/L (ref 98–110)
Creat: 0.79 mg/dL (ref 0.60–0.93)
GFR, Est African American: 87 mL/min/{1.73_m2} (ref >=60)
GFR, Est Non African American: 75 mL/min/{1.73_m2} (ref >=60)
Globulin: 2.3 g/dL (calc) (ref 1.9–3.7)
Glucose, Bld: 105 mg/dL — ABNORMAL HIGH (ref 65–99)
Potassium: 4.8 mmol/L (ref 3.5–5.3)
Sodium: 139 mmol/L (ref 135–146)
Total Bilirubin: 0.4 mg/dL (ref 0.2–1.2)
Total Protein: 6.4 g/dL (ref 6.1–8.1)

## 2016-12-15 LAB — TSH: TSH: 1.03 mIU/L (ref 0.40–4.50)

## 2016-12-18 ENCOUNTER — Telehealth: Payer: Self-pay

## 2016-12-18 NOTE — Telephone Encounter (Signed)
Pt advised.   Thanks,   -Laura  

## 2016-12-18 NOTE — Telephone Encounter (Signed)
-----   Message from Jerrol Banana., MD sent at 12/18/2016  8:31 AM EDT ----- ok

## 2016-12-26 ENCOUNTER — Ambulatory Visit: Payer: Self-pay | Admitting: Family Medicine

## 2017-02-03 ENCOUNTER — Other Ambulatory Visit: Payer: Self-pay | Admitting: Family Medicine

## 2017-02-03 DIAGNOSIS — F3289 Other specified depressive episodes: Secondary | ICD-10-CM

## 2017-02-06 ENCOUNTER — Other Ambulatory Visit: Payer: Self-pay

## 2017-02-06 DIAGNOSIS — F3289 Other specified depressive episodes: Secondary | ICD-10-CM

## 2017-02-06 MED ORDER — VENLAFAXINE HCL 75 MG PO TABS: ORAL_TABLET | ORAL | 3 refills | Status: DC

## 2017-02-14 ENCOUNTER — Other Ambulatory Visit: Payer: Self-pay | Admitting: Physician Assistant

## 2017-02-14 DIAGNOSIS — F3289 Other specified depressive episodes: Secondary | ICD-10-CM

## 2017-02-14 MED ORDER — VENLAFAXINE HCL 75 MG PO TABS: ORAL_TABLET | ORAL | 3 refills | Status: DC

## 2017-02-14 NOTE — Telephone Encounter (Signed)
Virginia Phillips, RMA

## 2017-02-14 NOTE — Telephone Encounter (Signed)
Pt is requesting a new Rx for venlafaxine (EFFEXOR) 75 MG tablet resent [facility].  [id number]

## 2017-03-08 DIAGNOSIS — K5900 Constipation, unspecified: Secondary | ICD-10-CM | POA: Diagnosis not present

## 2017-03-11 ENCOUNTER — Other Ambulatory Visit: Payer: Self-pay | Admitting: Physician Assistant

## 2017-03-11 DIAGNOSIS — I10 Essential (primary) hypertension: Secondary | ICD-10-CM

## 2017-03-13 ENCOUNTER — Encounter: Payer: Self-pay | Admitting: Family Medicine

## 2017-03-13 ENCOUNTER — Ambulatory Visit (INDEPENDENT_AMBULATORY_CARE_PROVIDER_SITE_OTHER): Payer: Medicare Other | Admitting: Family Medicine

## 2017-03-13 VITALS — BP 122/60 | HR 76 | Temp 97.7°F | Resp 16 | Wt 183.0 lb

## 2017-03-13 DIAGNOSIS — F419 Anxiety disorder, unspecified: Secondary | ICD-10-CM | POA: Diagnosis not present

## 2017-03-13 DIAGNOSIS — T8089XA Other complications following infusion, transfusion and therapeutic injection, initial encounter: Secondary | ICD-10-CM

## 2017-03-13 DIAGNOSIS — K5900 Constipation, unspecified: Secondary | ICD-10-CM | POA: Diagnosis not present

## 2017-03-13 NOTE — Progress Notes (Signed)
Patient: Virginia Phillips Female    DOB: Apr 20, 1945   72 y.o.   MRN: [id number] Visit Date: 03/13/2017  Today's Provider: Wilhemena Durie, MD   Chief Complaint  Patient presents with  . Constipation   Subjective:    HPI Pt is here today with concerns about her stomach.  She has lost of problems with constipation. She usually a sensation where her chest fills full and then she has a bowel movement She reports that on Jan 12 th she drank miralax with her hot before bed, then the next day her chest felt full and she had to loosen her bra. Since the miralax did not work she took 2 Dulolax. She had a BM about 4 hours after this. In July 2011 she had her sliding Hernia repaired. She is not suppose to be able to vomit. She reports that a few weeks ago she vomited but did not have the dry heaves. Sunday she had the dry heaves and vomited again. She reports that when she is able to have a BM the chest fullness goes away. Also she is concerned because her husband has been on a diet and she had been doing it with him and he has lost weight and she has not.  Her husband is here and says  "she cheats a lot on the diet."     Allergies  Allergen Reactions  . Amoxicillin Other (See Comments)    Sleepy  . Baby Oil   . Blue Dyes (Parenteral)   . Cortisone     Face purple  . Diphenhydramine Other (See Comments)  . Doxycycline Other (See Comments)  . Esomeprazole Other (See Comments)  . Iodinated Diagnostic Agents Other (See Comments)  . Metronidazole     Redness of the face  . Nortriptyline Hcl     Migraine  . Other Other (See Comments)  . Sulfamethoxazole-Trimethoprim Other (See Comments)  . Tetracycline Other (See Comments)  . Levofloxacin Rash  . Prednisone Rash and Swelling  . Sulfa Antibiotics Rash     Current Outpatient Medications:  .  aspirin 81 MG tablet, Take 1 tablet by mouth daily., Disp: , Rfl:  .  Bioflavonoid Products (VITAMIN C PLUS) 500 MG TABS, Take 1 tablet by  mouth daily. , Disp: , Rfl:  .  Cholecalciferol (VITAMIN D) 2000 UNITS CAPS, Take 4 capsules by mouth daily., Disp: , Rfl:  .  metoprolol succinate (TOPROL-XL) 25 MG 24 hr tablet, TAKE 1 TABLET BY MOUTH EVERY DAY, Disp: 90 tablet, Rfl: 1 .  pravastatin (PRAVACHOL) 10 MG tablet, TAKE 1 TABLET(10 MG) BY MOUTH AT BEDTIME, Disp: 90 tablet, Rfl: 1 .  Probiotic Product (PROBIOTIC-10 PO), Take by mouth daily., Disp: , Rfl:  .  riboflavin (VITAMIN B-2) 100 MG TABS tablet, Take 1 tablet by mouth daily., Disp: , Rfl:  .  venlafaxine (EFFEXOR) 75 MG tablet, TAKE 1 TABLET(75 MG) BY MOUTH DAILY, Disp: 90 tablet, Rfl: 3 .  vitamin B-12 (CYANOCOBALAMIN) 100 MCG tablet, Take 100 mcg by mouth daily., Disp: , Rfl:  .  Biotin 10 MG TABS, Take by mouth daily., Disp: , Rfl:  .  Chlorpheniramine-PSE-Ibuprofen 2-30-200 MG TABS, Take 1 tablet by mouth as needed. , Disp: , Rfl:  .  Coenzyme Q10 (CO Q10) 200 MG CAPS, Take 1 capsule by mouth 2 (two) times daily., Disp: , Rfl:  .  cycloSPORINE (RESTASIS) 0.05 % ophthalmic emulsion, Apply to eye 2 (two) times daily., Disp: ,  Rfl:  .  Lancets (ONETOUCH ULTRASOFT) lancets, Check sugar once daily DX 73.9 needs for one touch ultra 2 lancets (Patient not taking: Reported on 03/13/2017), Disp: 50 each, Rfl: 12 .  ONE TOUCH ULTRA TEST test strip, CHECK BLOOD SUGAR ONCE DAILY AS DIRECTED (Patient not taking: Reported on 03/13/2017), Disp: 100 each, Rfl: 3 .  Petasin (PETADOLEX PO), Take by mouth daily., Disp: , Rfl:  .  triamcinolone cream (KENALOG) 0.1 %, Apply topically as needed. 1 Cream apply to affected area as needed (Patient not taking: Reported on 03/13/2017), Disp: 45 g, Rfl: 1 .  valACYclovir (VALTREX) 1000 MG tablet, Take 1 tablet (1,000 mg total) by mouth 3 (three) times daily. (Patient not taking: Reported on 11/15/2016), Disp: 21 tablet, Rfl: 0  Review of Systems  Constitutional: Positive for fatigue.  HENT: Negative.   Eyes: Negative.   Respiratory: Negative.     Cardiovascular: Negative.   Gastrointestinal: Positive for abdominal distention, abdominal pain, constipation and nausea.  Endocrine: Negative.   Genitourinary: Negative.   Musculoskeletal: Negative.   Skin: Negative.   Allergic/Immunologic: Negative.   Neurological: Negative.   Hematological: Negative.   Psychiatric/Behavioral: Negative.     Social History   Tobacco Use  . Smoking status: Never Smoker  . Smokeless tobacco: Never Used  Substance Use Topics  . Alcohol use: No    Alcohol/week: 0.0 oz   Objective:   BP 122/60 (BP Location: Left Arm, Patient Position: Sitting, Cuff Size: Large)   Pulse 76   Temp 97.7 F (36.5 C) (Oral)   Resp 16   Wt 183 lb (83 kg)   SpO2 99%   BMI 30.45 kg/m  Vitals:   03/13/17 1419  BP: 122/60  Pulse: 76  Resp: 16  Temp: 97.7 F (36.5 C)  TempSrc: Oral  SpO2: 99%  Weight: 183 lb (83 kg)     Physical Exam  Constitutional: She is oriented to person, place, and time. She appears well-developed and well-nourished.  Eyes: Conjunctivae and EOM are normal. Pupils are equal, round, and reactive to light.  Neck: Normal range of motion. Neck supple.  Pulmonary/Chest: Effort normal and breath sounds normal.  Abdominal: Soft. Bowel sounds are normal.  Musculoskeletal: Normal range of motion.  Neurological: She is alert and oriented to person, place, and time. She has normal reflexes.  Skin: Skin is warm and dry.  Psychiatric: She has a normal mood and affect. Her behavior is normal. Judgment and thought content normal.        Assessment & Plan:     1. Constipation, unspecified constipation type  - Ambulatory referral to Gastroenterology 2.Chronic Anxiety 3.Obesity      HPI, Exam, and A&P Transcribed under the direction and in the presence of Richard L. Cranford Mon, MD  Electronically Signed: Katina Dung, [place], MD  [facility]

## 2017-03-23 ENCOUNTER — Other Ambulatory Visit: Payer: Self-pay

## 2017-03-23 ENCOUNTER — Encounter: Payer: Self-pay | Admitting: Gastroenterology

## 2017-03-23 ENCOUNTER — Ambulatory Visit (INDEPENDENT_AMBULATORY_CARE_PROVIDER_SITE_OTHER): Payer: Medicare Other | Admitting: Gastroenterology

## 2017-03-23 VITALS — BP 136/75 | HR 58 | Wt 184.6 lb

## 2017-03-23 DIAGNOSIS — K5900 Constipation, unspecified: Secondary | ICD-10-CM

## 2017-03-23 DIAGNOSIS — Z1212 Encounter for screening for malignant neoplasm of rectum: Secondary | ICD-10-CM

## 2017-03-23 DIAGNOSIS — Z1211 Encounter for screening for malignant neoplasm of colon: Secondary | ICD-10-CM

## 2017-03-23 NOTE — Patient Instructions (Signed)
F/u 6 months

## 2017-03-23 NOTE — Progress Notes (Signed)
[name] [address]  [place]  [place], [place] [address]  Main: [phone]  Fax: [phone]   Gastroenterology Consultation  Referring Provider:     Jerrol Banana.,* Primary Care Physician:  Mar Daring, PA-C Primary Gastroenterologist:  Dr. [name] Reason for Consultation:     Constipation        HPI:   Virginia Phillips is a 72 y.o. y/o female referred for consultation & management  by Dr. Marlyn Corporal, Clearnce Sorrel, PA-C.  Patient with chronic, years history of constipation presents for initial visit.  Patient reports hard stools at times, and days with no bowel movements requiring suppositories.  Takes MiraLAX every night.  Has never been on 2 doses of MiraLAX a day.  Was recently given Amitiza by her primary care provider, that she states that 2 loose bowel movements, and dizziness while on the toilet.  No blood in stool.  No weight loss.  No nausea vomiting.  Reports at times she feels so full that she has to unhook her bra, due to bloating.  At times when this happens, she describes abdominal cramping that is relieved after a good bowel movement. Last colonoscopy was in January 2013, with fair prep, with stool reported in the cecum, which did not allow evaluation of the cecum.  Surveillance timing was not mentioned in that report.  This report is available in provation.  Past medical history.  Sleep apnea, hyperlipidemia  Past Surgical History:  Procedure Laterality Date  . ANKLE FRACTURE SURGERY  2007  . GANGLION CYST EXCISION Right    wrist  . KNEE ARTHROSCOPY W/ ACL RECONSTRUCTION Right 2008  . KNEE SURGERY  08/05/2007  . LASIK    . NISSEN FUNDOPLICATION  2010    Prior to Admission medications   Medication Sig Start Date End Date Taking? Authorizing Provider  aspirin 81 MG tablet Take 1 tablet by mouth daily.   Yes [provider]  Bioflavonoid Products (VITAMIN C PLUS) 500 MG TABS Take 1 tablet by mouth daily.    Yes  [provider]  Biotin 10 MG TABS Take by mouth daily.   Yes [provider]  bisacodyl (BISACODYL) 5 MG EC tablet Take 5 mg by mouth daily as needed for moderate constipation.   Yes [provider]  Chlorpheniramine-PSE-Ibuprofen 2-30-200 MG TABS Take 1 tablet by mouth as needed.    Yes [provider]  Cholecalciferol (VITAMIN D) 2000 UNITS CAPS Take 4 capsules by mouth daily.   Yes [provider]  Coenzyme Q10 (CO Q10) 200 MG CAPS Take 1 capsule by mouth 2 (two) times daily.   Yes [provider]  Lancets [facility] ULTRASOFT) lancets Check sugar once daily DX 73.9 needs for one touch ultra 2 lancets 10/25/15  Yes Jerrol Banana., MD  lubiprostone (AMITIZA) 8 MCG capsule Take 8 mcg by mouth 2 (two) times daily with a meal.   Yes [provider]  metoprolol succinate (TOPROL-XL) 25 MG 24 hr tablet TAKE 1 TABLET BY MOUTH EVERY DAY 03/13/17  Yes Mar Daring, PA-C  ONE TOUCH ULTRA TEST test strip CHECK BLOOD SUGAR ONCE DAILY AS DIRECTED 11/13/16  Yes Jerrol Banana., MD  Petasin (PETADOLEX PO) Take by mouth daily.   Yes [provider]  polyethylene glycol (MIRALAX / GLYCOLAX) packet Take 17 g by mouth 2 (two) times daily.   Yes [provider]  pravastatin (PRAVACHOL) 10 MG tablet TAKE 1 TABLET(10  MG) BY MOUTH AT BEDTIME 10/31/16  Yes Burnette, Clearnce Sorrel, PA-C  Probiotic Product (PROBIOTIC-10 PO) Take by mouth daily.   Yes [provider]  riboflavin (VITAMIN B-2) 100 MG TABS tablet Take 1 tablet by mouth daily.   Yes [provider]  triamcinolone cream (KENALOG) 0.1 % Apply topically as needed. 1 Cream apply to affected area as needed 06/09/15  Yes Margarita Rana, MD  venlafaxine [facility]) 75 MG tablet TAKE 1 TABLET(75 MG) BY MOUTH DAILY 02/14/17  Yes Jerrol Banana., MD  vitamin B-12 (CYANOCOBALAMIN) 100 MCG tablet Take 100 mcg by mouth daily.   Yes [provider]   cycloSPORINE (RESTASIS) 0.05 % ophthalmic emulsion Apply to eye 2 (two) times daily.    [provider]  valACYclovir (VALTREX) 1000 MG tablet Take 1 tablet (1,000 mg total) by mouth 3 (three) times daily. Patient not taking: Reported on 11/15/2016 10/24/16   Jerrol Banana., MD    Family History  Problem Relation Age of Onset  . Congestive Heart Failure Mother   . Parkinson's disease Mother   . Diabetes Father   . Scleroderma Sister   . Cancer Brother      Social History   Tobacco Use  . Smoking status: Never Smoker  . Smokeless tobacco: Never Used  Substance Use Topics  . Alcohol use: No    Alcohol/week: 0.0 oz  . Drug use: No    Allergies as of 03/23/2017 - Review Complete 03/23/2017  Allergen Reaction Noted  . Amoxicillin Other (See Comments) 07/31/2014  . Baby oil  07/31/2014  . Blue dyes (parenteral)  07/31/2014  . Cortisone  07/31/2014  . Diphenhydramine Other (See Comments) 07/31/2014  . Doxycycline Other (See Comments) 07/31/2014  . Esomeprazole Other (See Comments) 07/31/2014  . Iodinated diagnostic agents Other (See Comments) 07/31/2014  . Metronidazole  06/08/2016  . Nortriptyline hcl  07/31/2014  . Other Other (See Comments) 07/31/2014  . Sulfamethoxazole-trimethoprim Other (See Comments) 06/02/2015  . Tetracycline Other (See Comments) 08/03/2014  . Levofloxacin Rash 07/31/2014  . Prednisone Rash, Swelling, and Other (See Comments) 10/21/2013  . Sulfa antibiotics Rash 07/31/2014    Review of Systems:    All systems reviewed and negative except where noted in HPI.   Physical Exam:  BP 136/75   Pulse (!) 58   Wt 184 lb 9.6 oz (83.7 kg)   BMI 30.72 kg/m  No LMP recorded. Patient is postmenopausal. Psych:  Alert and cooperative. Normal mood and affect. General:   Alert,  Well-developed, well-nourished, pleasant and cooperative in NAD Head:  Normocephalic and atraumatic. Eyes:  Sclera clear, no icterus.   Conjunctiva pink. Ears:   Normal auditory acuity. Nose:  No deformity, discharge, or lesions. Mouth:  No deformity or lesions,oropharynx pink & moist. Neck:  Supple; no masses or thyromegaly. Lungs:  Respirations even and unlabored.  Clear throughout to auscultation.   No wheezes, crackles, or rhonchi. No acute distress. Heart:  Regular rate and rhythm; no murmurs, clicks, rubs, or gallops. Abdomen:  Normal bowel sounds.  No bruits.  Soft, non-tender and non-distended without masses, hepatosplenomegaly or hernias noted.  No guarding or rebound tenderness.    Msk:  Symmetrical without gross deformities. Good, equal movement & strength bilaterally. Pulses:  Normal pulses noted. Extremities:  No clubbing or edema.  No cyanosis. Neurologic:  Alert and oriented x3;  grossly normal neurologically. Skin:  Intact without significant lesions or rashes. No jaundice. Lymph Nodes:  No significant cervical adenopathy. Psych:  Alert and cooperative. Normal mood and affect.   Labs: CBC    Component Value Date/Time   WBC 6.3 12/14/2016 1339   RBC 4.36 12/14/2016 1339   HGB 13.9 12/14/2016 1339   HGB 14.7 06/02/2015 1008   HCT 40.6 12/14/2016 1339   HCT 44.4 06/02/2015 1008   PLT 299 12/14/2016 1339   PLT 254 06/02/2015 1008   MCV 93.1 12/14/2016 1339   MCV 94 06/02/2015 1008   MCV 94 06/17/2013 2031   MCH 31.9 12/14/2016 1339   MCHC 34.2 12/14/2016 1339   RDW 12.5 12/14/2016 1339   RDW 12.7 06/02/2015 1008   RDW 12.9 06/17/2013 2031   LYMPHSABS 2,533 12/14/2016 1339   LYMPHSABS 3.7 (H) 06/02/2015 1008   EOSABS 132 12/14/2016 1339   EOSABS 0.1 06/02/2015 1008   BASOSABS 57 12/14/2016 1339   BASOSABS 0.0 06/02/2015 1008   CMP     Component Value Date/Time   NA 139 12/14/2016 1339   NA 141 06/02/2015 1008   NA 138 06/17/2013 2031   K 4.8 12/14/2016 1339   K 4.1 06/17/2013 2031   CL 106 12/14/2016 1339   CL 105 06/17/2013 2031   CO2 26 12/14/2016 1339   CO2 24 06/17/2013 2031   GLUCOSE 105 (H) 12/14/2016  1339   GLUCOSE 151 (H) 06/17/2013 2031   BUN 14 12/14/2016 1339   BUN 14 06/02/2015 1008   BUN 19 (H) 06/17/2013 2031   CREATININE 0.79 12/14/2016 1339   CALCIUM 9.1 12/14/2016 1339   CALCIUM 10.8 (H) 06/17/2013 2031   PROT 6.4 12/14/2016 1339   PROT 6.8 06/02/2015 1008   PROT 9.0 (H) 06/17/2013 2031   ALBUMIN 4.5 06/02/2015 1008   ALBUMIN 4.9 06/17/2013 2031   AST 21 12/14/2016 1339   AST 39 (H) 06/17/2013 2031   ALT 22 12/14/2016 1339   ALT 32 06/17/2013 2031   ALKPHOS 61 06/02/2015 1008   ALKPHOS 78 06/17/2013 2031   BILITOT 0.4 12/14/2016 1339   BILITOT 0.3 06/02/2015 1008   BILITOT 0.3 06/17/2013 2031   GFRNONAA 75 12/14/2016 1339   GFRAA 87 12/14/2016 1339    Imaging Studies: No results found.   Assessment and Plan:   JANAVIA ROTTMAN is a 72 y.o. y/o female has been referred for chronic constipation  The amities that does not seem to be helping the patient, and states that it caused loose bowel movements and cause dizziness on the toilet bowl I have asked the patient to stop all other medications and start taking MiraLAX twice a day.  The goal would be 1-2 soft bowel movements daily If not at goal with MiraLAX, can add Metamucil during the day in between.  I have also encouraged high-fiber diet I have also encouraged the use of squatty potty to allow for easier bowel movement anatomically.  However, I have specifically discussed that patient for sit down on the toilet bowl then put her legs on the stool squatty potty in order to prevent falls.  She verbalized understanding.  Can discuss other treatments, if above does not be to regular bowel movements daily or every other day.  I also noticed that her bra was very tight, causing a red line underneath her breasts at the level of the bra itself.  I have asked her to wear looser bras as that might be the cause of her discomfort underneath her bra line.  She is also due for CRC screening, as in 2013 her cecum was not able  to be examined due to stool in the cecum.  We can schedule that at this time as well. (I have discussed alternative options, risks & benefits,  which include, but are not limited to, bleeding, infection, perforation,respiratory complication & drug reaction.  The patient agrees with this plan & written consent will be obtained.  )  Dr [name]

## 2017-04-09 DIAGNOSIS — K5900 Constipation, unspecified: Secondary | ICD-10-CM | POA: Diagnosis not present

## 2017-04-13 ENCOUNTER — Other Ambulatory Visit: Payer: Self-pay

## 2017-04-13 DIAGNOSIS — Z1211 Encounter for screening for malignant neoplasm of colon: Secondary | ICD-10-CM

## 2017-04-22 ENCOUNTER — Other Ambulatory Visit: Payer: Self-pay | Admitting: Physician Assistant

## 2017-05-10 ENCOUNTER — Ambulatory Visit: Payer: Medicare Other | Admitting: Certified Registered Nurse Anesthetist

## 2017-05-10 ENCOUNTER — Encounter
Admission: RE | Disposition: A | Discharge status: Home / Self Care | Payer: Self-pay | Source: Ambulatory Visit | Attending: Gastroenterology

## 2017-05-10 ENCOUNTER — Ambulatory Visit
Admission: RE | Admit: 2017-05-10 | Discharge: 2017-05-10 | Disposition: A | Discharge status: Home / Self Care | Payer: Medicare Other | Source: Ambulatory Visit | Attending: Gastroenterology | Admitting: Gastroenterology

## 2017-05-10 ENCOUNTER — Encounter: Payer: Self-pay | Admitting: *Deleted

## 2017-05-10 DIAGNOSIS — Z888 Allergy status to other drugs, medicaments and biological substances status: Secondary | ICD-10-CM | POA: Insufficient documentation

## 2017-05-10 DIAGNOSIS — Z882 Allergy status to sulfonamides status: Secondary | ICD-10-CM | POA: Diagnosis not present

## 2017-05-10 DIAGNOSIS — K5790 Diverticulosis of intestine, part unspecified, without perforation or abscess without bleeding: Secondary | ICD-10-CM | POA: Diagnosis not present

## 2017-05-10 DIAGNOSIS — D123 Benign neoplasm of transverse colon: Secondary | ICD-10-CM | POA: Diagnosis not present

## 2017-05-10 DIAGNOSIS — Z79899 Other long term (current) drug therapy: Secondary | ICD-10-CM | POA: Insufficient documentation

## 2017-05-10 DIAGNOSIS — E669 Obesity, unspecified: Secondary | ICD-10-CM | POA: Insufficient documentation

## 2017-05-10 DIAGNOSIS — K5730 Diverticulosis of large intestine without perforation or abscess without bleeding: Secondary | ICD-10-CM

## 2017-05-10 DIAGNOSIS — Z1211 Encounter for screening for malignant neoplasm of colon: Secondary | ICD-10-CM | POA: Diagnosis not present

## 2017-05-10 DIAGNOSIS — Z880 Allergy status to penicillin: Secondary | ICD-10-CM | POA: Diagnosis not present

## 2017-05-10 DIAGNOSIS — E785 Hyperlipidemia, unspecified: Secondary | ICD-10-CM | POA: Diagnosis not present

## 2017-05-10 DIAGNOSIS — Z7982 Long term (current) use of aspirin: Secondary | ICD-10-CM | POA: Insufficient documentation

## 2017-05-10 DIAGNOSIS — Z881 Allergy status to other antibiotic agents status: Secondary | ICD-10-CM | POA: Insufficient documentation

## 2017-05-10 DIAGNOSIS — K635 Polyp of colon: Secondary | ICD-10-CM | POA: Diagnosis not present

## 2017-05-10 DIAGNOSIS — I2510 Atherosclerotic heart disease of native coronary artery without angina pectoris: Secondary | ICD-10-CM | POA: Diagnosis not present

## 2017-05-10 DIAGNOSIS — Z6827 Body mass index (BMI) 27.0-27.9, adult: Secondary | ICD-10-CM | POA: Insufficient documentation

## 2017-05-10 DIAGNOSIS — I10 Essential (primary) hypertension: Secondary | ICD-10-CM | POA: Diagnosis not present

## 2017-05-10 DIAGNOSIS — G4730 Sleep apnea, unspecified: Secondary | ICD-10-CM | POA: Diagnosis not present

## 2017-05-10 HISTORY — DX: Atherosclerotic heart disease of native coronary artery without angina pectoris: I25.10

## 2017-05-10 HISTORY — DX: Prediabetes: R73.03

## 2017-05-10 HISTORY — PX: COLONOSCOPY WITH PROPOFOL: SHX5780

## 2017-05-10 HISTORY — DX: Gastro-esophageal reflux disease without esophagitis: K21.9

## 2017-05-10 HISTORY — DX: Essential (primary) hypertension: I10

## 2017-05-10 SURGERY — COLONOSCOPY WITH PROPOFOL
Anesthesia: General

## 2017-05-10 MED ORDER — ESMOLOL HCL 100 MG/10ML IV SOLN
INTRAVENOUS | Status: DC | PRN
  Administered 2017-05-10: 20 mg via INTRAVENOUS

## 2017-05-10 MED ORDER — FENTANYL CITRATE (PF) 100 MCG/2ML IJ SOLN
INTRAMUSCULAR | Status: DC | PRN
  Administered 2017-05-10: 100 ug via INTRAVENOUS

## 2017-05-10 MED ORDER — PROPOFOL 10 MG/ML IV BOLUS
INTRAVENOUS | Status: AC
  Filled 2017-05-10: qty 20

## 2017-05-10 MED ORDER — PHENYLEPHRINE HCL 10 MG/ML IJ SOLN
INTRAMUSCULAR | Status: DC | PRN
  Administered 2017-05-10: 100 ug via INTRAVENOUS

## 2017-05-10 MED ORDER — PROPOFOL 10 MG/ML IV BOLUS
INTRAVENOUS | Status: DC | PRN
  Administered 2017-05-10: 20 mg via INTRAVENOUS
  Administered 2017-05-10: 60 mg via INTRAVENOUS
  Administered 2017-05-10: 20 mg via INTRAVENOUS

## 2017-05-10 MED ORDER — FENTANYL CITRATE (PF) 100 MCG/2ML IJ SOLN
INTRAMUSCULAR | Status: AC
  Filled 2017-05-10: qty 2

## 2017-05-10 MED ORDER — SODIUM CHLORIDE 0.9 % IV SOLN
INTRAVENOUS | Status: DC
  Administered 2017-05-10: 1000 mL via INTRAVENOUS

## 2017-05-10 MED ORDER — ONDANSETRON HCL 4 MG/2ML IJ SOLN
INTRAMUSCULAR | Status: DC | PRN
  Administered 2017-05-10: 4 mg via INTRAVENOUS

## 2017-05-10 MED ORDER — PROPOFOL 500 MG/50ML IV EMUL
INTRAVENOUS | Status: AC
  Filled 2017-05-10: qty 50

## 2017-05-10 MED ORDER — GLYCOPYRROLATE 0.2 MG/ML IJ SOLN
INTRAMUSCULAR | Status: DC | PRN
  Administered 2017-05-10 (×2): 0.1 mg via INTRAVENOUS

## 2017-05-10 MED ORDER — PROPOFOL 500 MG/50ML IV EMUL
INTRAVENOUS | Status: DC | PRN
  Administered 2017-05-10: 160 ug/kg/min via INTRAVENOUS

## 2017-05-10 NOTE — Anesthesia Postprocedure Evaluation (Signed)
Anesthesia Post Note  Patient: Virginia Phillips  Procedure(s) Performed: COLONOSCOPY WITH PROPOFOL (N/A )  Patient location during evaluation: Endoscopy Anesthesia Type: General Level of consciousness: awake and alert and oriented Pain management: pain level controlled Vital Signs Assessment: post-procedure vital signs reviewed and stable Respiratory status: spontaneous breathing, nonlabored ventilation and respiratory function stable Cardiovascular status: blood pressure returned to baseline and stable Postop Assessment: no signs of nausea or vomiting Anesthetic complications: no     Last Vitals:  Vitals:   05/10/17 1445 05/10/17 1455  BP: 134/79 (!) 153/80  Pulse: 83   Resp:    Temp:    SpO2: 96% 96%    Last Pain:  Vitals:   05/10/17 1435  TempSrc: Tympanic                 Shamica Moree

## 2017-05-10 NOTE — Anesthesia Preprocedure Evaluation (Signed)
Anesthesia Evaluation  Patient identified by MRN, date of birth, ID band Patient awake    Reviewed: Allergy & Precautions, NPO status , Patient's Chart, lab work & pertinent test results  History of Anesthesia Complications Negative for: history of anesthetic complications  Airway Mallampati: II  TM Distance: >3 FB Neck ROM: Full    Dental no notable dental hx.    Pulmonary sleep apnea (mild, does not have CPAP prescribed) , neg COPD,    breath sounds clear to auscultation- rhonchi (-) wheezing      Cardiovascular Exercise Tolerance: Good hypertension, (-) CAD, (-) Past MI and (-) Cardiac Stents  Rhythm:Regular Rate:Normal - Systolic murmurs and - Diastolic murmurs    Neuro/Psych  Headaches, PSYCHIATRIC DISORDERS Dementia CVA, No Residual Symptoms    GI/Hepatic hiatal hernia, GERD  ,(+) Hepatitis - (NASH)Fatty liver   Endo/Other  negative endocrine ROSneg diabetes  Renal/GU negative Renal ROS     Musculoskeletal negative musculoskeletal ROS (+)   Abdominal (+) + obese,   Peds  Hematology negative hematology ROS (+)   Anesthesia Other Findings    Reproductive/Obstetrics                             Anesthesia Physical Anesthesia Plan  ASA: III  Anesthesia Plan: General   Post-op Pain Management:    Induction: Intravenous  PONV Risk Score and Plan: 2 and Propofol infusion  Airway Management Planned: Natural Airway  Additional Equipment:   Intra-op Plan:   Post-operative Plan:   Informed Consent: I have reviewed the patients History and Physical, chart, labs and discussed the procedure including the risks, benefits and alternatives for the proposed anesthesia with the patient or authorized representative who has indicated his/her understanding and acceptance.   Dental advisory given  Plan Discussed with: CRNA and Anesthesiologist  Anesthesia Plan Comments:          Anesthesia Quick Evaluation

## 2017-05-10 NOTE — Anesthesia Post-op Follow-up Note (Signed)
Anesthesia QCDR form completed.        

## 2017-05-10 NOTE — Op Note (Signed)
[facility] Patient Name: Oliana Gowens Procedure Date: 05/10/2017 1:02 PM MRN: [id number] Account #: [id number] Date of Birth: [birth year] Admit Type: Outpatient Age: 72 Room: [facility] ENDO ROOM 2 Gender: Female Note Status: Finalized Procedure:            Colonoscopy Indications:          Screening for colorectal malignant neoplasm Providers:             B. Bonna Gains MD, MD Referring MD:         Janine Ores. Rosanna Randy, MD (Referring MD) Medicines:            Monitored Anesthesia Care Complications:        No immediate complications. Procedure:            Pre-Anesthesia Assessment:                       - ASA Grade Assessment: II - A patient with mild                        systemic disease.                       - Prior to the procedure, a History and Physical was                        performed, and patient medications, allergies and                        sensitivities were reviewed. The patient's tolerance of                        previous anesthesia was reviewed.                       - The risks and benefits of the procedure and the                        sedation options and risks were discussed with the                        patient. All questions were answered and informed                        consent was obtained.                       - Patient identification and proposed procedure were                        verified prior to the procedure by the physician, the                        nurse, the anesthesiologist, the anesthetist and the                        technician. The procedure was verified in the procedure                        room.  After obtaining informed consent, the colonoscope was                        passed under direct vision. Throughout the procedure,                        the patient's blood pressure, pulse, and oxygen                        saturations were monitored continuously. The                     Colonoscope was introduced through the anus and                        advanced to the the cecum, identified by appendiceal                        orifice and ileocecal valve. The colonoscopy was                        performed with ease. The patient tolerated the                        procedure well. The quality of the bowel preparation                        was good except the cecum was fair. The Colonoscope was                        introduced through the anus and advanced to the the                        cecum, identified by appendiceal orifice and ileocecal                        valve. Findings:      The perianal and digital rectal examinations were normal.      A 3 mm polyp was found in the transverse colon. The polyp was sessile.       The polyp was removed with a cold biopsy forceps. Resection and       retrieval were complete.      Multiple (associated with angulation and twisting of the colon)       diverticula were found in the sigmoid colon.      The exam was otherwise without abnormality.      The rectum, sigmoid colon, descending colon, transverse colon, ascending       colon and cecum appeared normal.      The retroflexed view of the distal rectum and anal verge was normal and       showed no anal or rectal abnormalities.      The adult colonoscope was not able to be advanced past the sigmoid colon       due to angulation and twisting caused by diverticuli in the area. A       pediatric colonoscope was used, and was able to be advanced to the       cecum. Abdominal pressure, delooping and withdrawal maneuvers, and       position changes to back were used to advanced the colonoscope to  the       cecum safely. Impression:           - One 3 mm polyp in the transverse colon, removed with                        a cold biopsy forceps. Resected and retrieved.                       - Diverticulosis in the sigmoid colon.                       - The  examination was otherwise normal.                       - The rectum, sigmoid colon, descending colon,                        transverse colon, ascending colon and cecum are normal.                       - The distal rectum and anal verge are normal on                        retroflexion view. Recommendation:       - Discharge patient to home (with escort).                       - Advance diet as tolerated.                       - Continue present medications.                       - Await pathology results.                       - The findings and recommendations were discussed with                        the patient.                       - The findings and recommendations were discussed with                        the patient's family.                       - Return to primary care physician as previously                        scheduled.                       - High fiber diet.                       - For further CRC screening in the future, would                        recommend PCP to consider CT colonoscopy instead of  endoscopic procedure (previous colonoscopy in 2013                        reported that cecum could not be examined completely at                        that time. Today's exam noted angulations in the                        sigmoid colon due to diverticulosis, and required                        abdominal pressure, position changes, and use of a                        pediatric colonoscope to reach the cecum safely. CT                        colonoscopy every 5 yrs per guidelines would allow for                        a safe screening method in the future for this patient).                       Repeat CRC screening recommended in 5 yrs. Consider CT                        colonoscopy at that time. Procedure Code(s):    --- Professional ---                       [phone], Colonoscopy, flexible; with biopsy, single or                         multiple Diagnosis Code(s):    --- Professional ---                       Z12.11, Encounter for screening for malignant neoplasm                        of colon                       D12.3, Benign neoplasm of transverse colon (hepatic                        flexure or splenic flexure)                       K57.30, Diverticulosis of large intestine without                        perforation or abscess without bleeding CPT copyright 2016 [employer]. All rights reserved. The codes documented in this report are preliminary and upon coder review may  be revised to meet current compliance requirements.  [name], MD Margretta Sidle B. Bonna Gains MD, MD 05/10/2017 2:44:14 PM This report has been signed electronically. Number of Addenda: 0 Note Initiated On: 05/10/2017 1:02 PM Scope Withdrawal Time: 0 hours 26 minutes 27 seconds  Total Procedure Duration: 1 hour 17 minutes 6 seconds  Estimated Blood  Loss: Estimated blood loss: none.      [facility]

## 2017-05-10 NOTE — H&P (Signed)
Virginia Antigua, MD [address], [place], [place], [place], [address] [address], [place], [place], [place], [address] Phone: [phone]  Fax: [phone]  Primary Care Physician:  Jerrol Banana., MD   Pre-Procedure History & Physical: HPI:  Virginia Phillips is a 72 y.o. female is here for a colonoscopy.   No past medical history on file.  Past Surgical History:  Procedure Laterality Date  . ANKLE FRACTURE SURGERY  2007  . GANGLION CYST EXCISION Right    wrist  . KNEE ARTHROSCOPY W/ ACL RECONSTRUCTION Right 2008  . KNEE SURGERY  08/05/2007  . LASIK    . NISSEN FUNDOPLICATION  1975    Prior to Admission medications   Medication Sig Start Date End Date Taking? Authorizing Provider  aspirin 81 MG tablet Take 1 tablet by mouth daily.    [provider]  Bioflavonoid Products (VITAMIN C PLUS) 500 MG TABS Take 1 tablet by mouth daily.     [provider]  Biotin 10 MG TABS Take by mouth daily.    [provider]  bisacodyl (BISACODYL) 5 MG EC tablet Take 5 mg by mouth daily as needed for moderate constipation.    [provider]  Chlorpheniramine-PSE-Ibuprofen 2-30-200 MG TABS Take 1 tablet by mouth as needed.     [provider]  Cholecalciferol (VITAMIN D) 2000 UNITS CAPS Take 4 capsules by mouth daily.    [provider]  Coenzyme Q10 (CO Q10) 200 MG CAPS Take 1 capsule by mouth 2 (two) times daily.    [provider]  cycloSPORINE (RESTASIS) 0.05 % ophthalmic emulsion Apply to eye 2 (two) times daily.    [provider]  Lancets [facility] ULTRASOFT) lancets Check sugar once daily DX 73.9 needs for one touch ultra 2 lancets 10/25/15   Jerrol Banana., MD  lubiprostone (AMITIZA) 8 MCG capsule Take 8 mcg by mouth 2 (two) times daily with a meal.    [provider]  metoprolol succinate (TOPROL-XL) 25 MG 24 hr tablet TAKE 1 TABLET BY MOUTH EVERY DAY 03/13/17   Mar Daring,  PA-C  ONE TOUCH ULTRA TEST test strip CHECK BLOOD SUGAR ONCE DAILY AS DIRECTED 11/13/16   Jerrol Banana., MD  Petasin (PETADOLEX PO) Take by mouth daily.    [provider]  polyethylene glycol (MIRALAX / GLYCOLAX) packet Take 17 g by mouth 2 (two) times daily.    [provider]  pravastatin (PRAVACHOL) 10 MG tablet TAKE 1 TABLET(10 MG) BY MOUTH AT BEDTIME 04/24/17   Jerrol Banana., MD  Probiotic Product (PROBIOTIC-10 PO) Take by mouth daily.    [provider]  riboflavin (VITAMIN B-2) 100 MG TABS tablet Take 1 tablet by mouth daily.    [provider]  triamcinolone cream (KENALOG) 0.1 % Apply topically as needed. 1 Cream apply to affected area as needed 06/09/15   Margarita Rana, MD  valACYclovir (VALTREX) 1000 MG tablet Take 1 tablet (1,000 mg total) by mouth 3 (three) times daily. Patient not taking: Reported on 11/15/2016 10/24/16   Jerrol Banana., MD  venlafaxine [facility]) 75 MG tablet TAKE 1 TABLET(75 MG) BY MOUTH DAILY 02/14/17   Jerrol Banana., MD  vitamin B-12 (CYANOCOBALAMIN) 100 MCG tablet Take 100 mcg by mouth daily.    [provider]    Allergies as of 03/23/2017 - Review Complete 03/23/2017  Allergen Reaction Noted  . Amoxicillin Other (See Comments) 07/31/2014  .  Baby oil  07/31/2014  . Blue dyes (parenteral)  07/31/2014  . Cortisone  07/31/2014  . Diphenhydramine Other (See Comments) 07/31/2014  . Doxycycline Other (See Comments) 07/31/2014  . Esomeprazole Other (See Comments) 07/31/2014  . Iodinated diagnostic agents Other (See Comments) 07/31/2014  . Metronidazole  06/08/2016  . Nortriptyline hcl  07/31/2014  . Other Other (See Comments) 07/31/2014  . Sulfamethoxazole-trimethoprim Other (See Comments) 06/02/2015  . Tetracycline Other (See Comments) 08/03/2014  . Levofloxacin Rash 07/31/2014  . Prednisone Rash, Swelling, and Other (See Comments) 10/21/2013  . Sulfa antibiotics Rash 07/31/2014      Family History  Problem Relation Age of Onset  . Congestive Heart Failure Mother   . Parkinson's disease Mother   . Diabetes Father   . Scleroderma Sister   . Cancer Brother     Social History   Socioeconomic History  . Marital status: Married    Spouse name: Not on file  . Number of children: Not on file  . Years of education: Not on file  . Highest education level: Not on file  Social Needs  . Financial resource strain: Not on file  . Food insecurity - worry: Not on file  . Food insecurity - inability: Not on file  . Transportation needs - medical: Not on file  . Transportation needs - non-medical: Not on file  Occupational History  . Not on file  Tobacco Use  . Smoking status: Never Smoker  . Smokeless tobacco: Never Used  Substance and Sexual Activity  . Alcohol use: No    Alcohol/week: 0.0 oz  . Drug use: No  . Sexual activity: Not on file  Other Topics Concern  . Not on file  Social History Narrative  . Not on file    Review of Systems: See HPI, otherwise negative ROS  Physical Exam: There were no vitals taken for this visit. General:   Alert,  pleasant and cooperative in NAD Head:  Normocephalic and atraumatic. Neck:  Supple; no masses or thyromegaly. Lungs:  Clear throughout to auscultation, normal respiratory effort.    Heart:  +S1, +S2, Regular rate and rhythm, No edema. Abdomen:  Soft, nontender and nondistended. Normal bowel sounds, without guarding, and without rebound.   Neurologic:  Alert and  oriented x4;  grossly normal neurologically.  Impression/Plan: Virginia Phillips is here for a colonoscopy to be performed for average risk screening.  Risks, benefits, limitations, and alternatives regarding  colonoscopy have been reviewed with the patient.  Questions have been answered.  All parties agreeable.   [name], MD  05/10/2017, 12:37 PM

## 2017-05-10 NOTE — Anesthesia Procedure Notes (Signed)
Performed by: Demetrius Charity, CRNA Pre-anesthesia Checklist: Patient identified, Emergency Drugs available, Suction available, Patient being monitored and Timeout performed Patient Re-evaluated:Patient Re-evaluated prior to induction Oxygen Delivery Method: Nasal cannula Induction Type: IV induction

## 2017-05-10 NOTE — Transfer of Care (Signed)
Immediate Anesthesia Transfer of Care Note  Patient: Virginia Phillips  Procedure(s) Performed: COLONOSCOPY WITH PROPOFOL (N/A )  Patient Location: PACU  Anesthesia Type:General  Level of Consciousness: awake and alert   Airway & Oxygen Therapy: Patient Spontanous Breathing and Patient connected to nasal cannula oxygen  Post-op Assessment: Report given to RN and Post -op Vital signs reviewed and stable  Post vital signs: Reviewed and stable  Last Vitals:  Vitals:   05/10/17 1248  BP: (!) 155/77  Pulse: 78  Resp: 20  Temp: 36.7 C    Last Pain:  Vitals:   05/10/17 1248  TempSrc: Tympanic      Patients Stated Pain Goal: 0 (41/66/06 3016)  Complications: No apparent anesthesia complications

## 2017-05-11 ENCOUNTER — Encounter: Payer: Self-pay | Admitting: Gastroenterology

## 2017-05-14 LAB — SURGICAL PATHOLOGY

## 2017-05-17 ENCOUNTER — Encounter: Payer: Self-pay | Admitting: Family Medicine

## 2017-05-17 ENCOUNTER — Ambulatory Visit (INDEPENDENT_AMBULATORY_CARE_PROVIDER_SITE_OTHER): Payer: Medicare Other | Admitting: Family Medicine

## 2017-05-17 VITALS — BP 140/84 | HR 58 | Temp 97.9°F | Resp 16 | Wt 182.5 lb

## 2017-05-17 DIAGNOSIS — I10 Essential (primary) hypertension: Secondary | ICD-10-CM

## 2017-05-17 DIAGNOSIS — Z87828 Personal history of other (healed) physical injury and trauma: Secondary | ICD-10-CM | POA: Diagnosis not present

## 2017-05-17 DIAGNOSIS — E7849 Other hyperlipidemia: Secondary | ICD-10-CM

## 2017-05-17 DIAGNOSIS — R739 Hyperglycemia, unspecified: Secondary | ICD-10-CM | POA: Diagnosis not present

## 2017-05-17 DIAGNOSIS — K7581 Nonalcoholic steatohepatitis (NASH): Secondary | ICD-10-CM

## 2017-05-17 LAB — POCT GLYCOSYLATED HEMOGLOBIN (HGB A1C): Hemoglobin A1C: 5.7

## 2017-05-17 NOTE — Progress Notes (Signed)
Patient: Virginia Phillips Female    DOB: 08/10/1945   72 y.o.   MRN: [id number] Visit Date: 05/17/2017  Today's Provider: Wilhemena Durie, MD   Chief Complaint  Patient presents with  . Depression  . Anxiety  . Hyperglycemia  . Advice Only    Patient would like to address getting labs drawn for fatty liver.    Subjective:    HPI     Depression, Follow-up  She  was last seen for this 6 months ago. Changes made at last visit include none.   She reports excellent compliance with treatment. She is not having side effects.   She reports excellent tolerance of treatment. Current symptoms include:none She feels she is Improved since last visit.  ------------------------------------------------------------------------ Depression screen [facility] 2/9 05/17/2017 11/15/2016 10/24/2016 06/08/2015  Decreased Interest 0 0 0 0  Down, Depressed, Hopeless 0 0 0 0  PHQ - 2 Score 0 0 0 0  Altered sleeping - 2 - -  Tired, decreased energy - 2 - -  Change in appetite - 2 - -  Feeling bad or failure about yourself  - 0 - -  Trouble concentrating - 0 - -  Moving slowly or fidgety/restless - 0 - -  Suicidal thoughts - 0 - -  PHQ-9 Score - 6 - -  Difficult doing work/chores - Not difficult at all - -    Hyperglycemia Virginia Phillips has a history of some elevated blood glucose readings without a diagnosis of diabetes. She denies polyphagia. Patient reports that she has been watching her diet.   Anxiety: Patient reports symptoms are improved.  Pt want s to talk about her new socks for her "claw toes". She says they help a lot.  GAD 7 : Generalized Anxiety Score 05/17/2017  Nervous, Anxious, on Edge 0  Control/stop worrying 0  Worry too much - different things 0  Trouble relaxing 2  Restless 1  Easily annoyed or irritable 0  Afraid - awful might happen 0  Total GAD 7 Score 3  Anxiety Difficulty Not difficult at all     Allergies  Allergen Reactions  . Amoxicillin Other (See Comments)   Sleepy  . Baby Oil   . Blue Dyes (Parenteral)   . Cortisone     Face purple  . Diphenhydramine Other (See Comments)  . Doxycycline Other (See Comments)  . Esomeprazole Other (See Comments)  . Iodinated Diagnostic Agents Other (See Comments)  . Metronidazole     Redness of the face  . Nortriptyline Hcl     Migraine  . Other Other (See Comments)  . Sulfamethoxazole-Trimethoprim Other (See Comments)  . Tetracycline Other (See Comments)  . Levofloxacin Rash  . Prednisone Rash, Swelling and Other (See Comments)  . Sulfa Antibiotics Rash     Current Outpatient Medications:  .  aspirin 81 MG tablet, Take 1 tablet by mouth daily., Disp: , Rfl:  .  Bioflavonoid Products (VITAMIN C PLUS) 500 MG TABS, Take 1 tablet by mouth daily. , Disp: , Rfl:  .  Biotin 10 MG TABS, Take by mouth daily., Disp: , Rfl:  .  bisacodyl (BISACODYL) 5 MG EC tablet, Take 5 mg by mouth daily as needed for moderate constipation., Disp: , Rfl:  .  Chlorpheniramine-PSE-Ibuprofen 2-30-200 MG TABS, Take 1 tablet by mouth as needed. , Disp: , Rfl:  .  Cholecalciferol (VITAMIN D) 2000 UNITS CAPS, Take 4 capsules by mouth daily., Disp: , Rfl:  .  Coenzyme Q10 (CO Q10) 200 MG CAPS, Take 1 capsule by mouth 2 (two) times daily., Disp: , Rfl:  .  cycloSPORINE (RESTASIS) 0.05 % ophthalmic emulsion, Apply to eye 2 (two) times daily., Disp: , Rfl:  .  Lancets (ONETOUCH ULTRASOFT) lancets, Check sugar once daily DX 73.9 needs for one touch ultra 2 lancets, Disp: 50 each, Rfl: 12 .  lubiprostone (AMITIZA) 8 MCG capsule, Take 8 mcg by mouth 2 (two) times daily with a meal., Disp: , Rfl:  .  metoprolol succinate (TOPROL-XL) 25 MG 24 hr tablet, TAKE 1 TABLET BY MOUTH EVERY DAY, Disp: 90 tablet, Rfl: 1 .  ONE TOUCH ULTRA TEST test strip, CHECK BLOOD SUGAR ONCE DAILY AS DIRECTED, Disp: 100 each, Rfl: 3 .  Petasin (PETADOLEX PO), Take by mouth daily., Disp: , Rfl:  .  polyethylene glycol (MIRALAX / GLYCOLAX) packet, Take 17 g by mouth 2  (two) times daily., Disp: , Rfl:  .  pravastatin (PRAVACHOL) 10 MG tablet, TAKE 1 TABLET(10 MG) BY MOUTH AT BEDTIME, Disp: 90 tablet, Rfl: 0 .  Probiotic Product (PROBIOTIC-10 PO), Take by mouth daily., Disp: , Rfl:  .  riboflavin (VITAMIN B-2) 100 MG TABS tablet, Take 1 tablet by mouth daily., Disp: , Rfl:  .  triamcinolone cream (KENALOG) 0.1 %, Apply topically as needed. 1 Cream apply to affected area as needed, Disp: 45 g, Rfl: 1 .  valACYclovir (VALTREX) 1000 MG tablet, Take 1 tablet (1,000 mg total) by mouth 3 (three) times daily., Disp: 21 tablet, Rfl: 0 .  venlafaxine (EFFEXOR) 75 MG tablet, TAKE 1 TABLET(75 MG) BY MOUTH DAILY, Disp: 90 tablet, Rfl: 3 .  vitamin B-12 (CYANOCOBALAMIN) 100 MCG tablet, Take 100 mcg by mouth daily., Disp: , Rfl:  .  vitamin C (ASCORBIC ACID) 500 MG tablet, Take by mouth., Disp: , Rfl:   Review of Systems  Constitutional: Negative.   HENT: Negative.   Eyes: Negative.   Respiratory: Negative.   Cardiovascular: Negative.   Gastrointestinal: Negative.   Endocrine: Negative.   Genitourinary: Negative.   Musculoskeletal: Negative.   Skin: Negative.   Allergic/Immunologic: Negative.   Neurological: Negative.   Hematological: Negative.   Psychiatric/Behavioral: Negative.     Social History   Tobacco Use  . Smoking status: Never Smoker  . Smokeless tobacco: Never Used  Substance Use Topics  . Alcohol use: No    Alcohol/week: 0.0 oz   Objective:   BP 140/84   Pulse (!) 58   Temp 97.9 F (36.6 C) (Oral)   Resp 16   Wt 182 lb 8 oz (82.8 kg)   SpO2 96%   BMI 28.58 kg/m  Vitals:   05/17/17 1518  BP: 140/84  Pulse: (!) 58  Resp: 16  Temp: 97.9 F (36.6 C)  TempSrc: Oral  SpO2: 96%  Weight: 182 lb 8 oz (82.8 kg)     Physical Exam  Constitutional: She appears well-developed and well-nourished.  Eyes: Pupils are equal, round, and reactive to light.  Neck: Normal range of motion.  Cardiovascular: Normal rate.  Pulmonary/Chest: Effort  normal.  Abdominal: Soft.  Musculoskeletal: She exhibits no edema.  Neurological: She is alert.  Skin: Skin is warm and dry.  Psychiatric: She has a normal mood and affect. Her behavior is normal. Thought content normal.        Assessment & Plan:     1. Hyperglycemia  - POCT glycosylated hemoglobin (Hb A1C)  2. Other hyperlipidemia  - Comprehensive Metabolic Panel (CMET) -  Lipid Profile  3. Essential hypertension  - CBC with Differential/Platelet - TSH 4.Fatty liver Pt wants to know what to do.weight loss and avoid alcohol and tylenol.Consider GI referra.   5."claw foot" Pt likes socks. Also wants to know where to get her ears pierced.I do not know where to go for that.  I have done the exam and reviewed the chart and it is accurate to the best of my knowledge. Development worker, community has been used and  any errors in dictation or transcription are unintentional. Miguel Aschoff M.D. [place], MD  [facility]

## 2017-05-18 DIAGNOSIS — E7849 Other hyperlipidemia: Secondary | ICD-10-CM | POA: Diagnosis not present

## 2017-05-18 DIAGNOSIS — I10 Essential (primary) hypertension: Secondary | ICD-10-CM | POA: Diagnosis not present

## 2017-05-19 LAB — CBC WITH DIFFERENTIAL/PLATELET
Basophils Absolute: 0.1 10*3/uL (ref 0.0–0.2)
Basos: 1 %
EOS (ABSOLUTE): 0.2 10*3/uL (ref 0.0–0.4)
Eos: 2 %
Hematocrit: 41.8 % (ref 34.0–46.6)
Hemoglobin: 14 g/dL (ref 11.1–15.9)
Immature Grans (Abs): 0 10*3/uL (ref 0.0–0.1)
Immature Granulocytes: 0 %
Lymphocytes Absolute: 2.7 10*3/uL (ref 0.7–3.1)
Lymphs: 42 %
MCH: 31.7 pg (ref 26.6–33.0)
MCHC: 33.5 g/dL (ref 31.5–35.7)
MCV: 95 fL (ref 79–97)
Monocytes Absolute: 0.6 10*3/uL (ref 0.1–0.9)
Monocytes: 10 %
Neutrophils Absolute: 2.9 10*3/uL (ref 1.4–7.0)
Neutrophils: 45 %
Platelets: 259 10*3/uL (ref 150–379)
RBC: 4.42 x10E6/uL (ref 3.77–5.28)
RDW: 13.9 % (ref 12.3–15.4)
WBC: 6.4 10*3/uL (ref 3.4–10.8)

## 2017-05-19 LAB — COMPREHENSIVE METABOLIC PANEL
ALT: 26 IU/L (ref 0–32)
AST: 26 IU/L (ref 0–40)
Albumin/Globulin Ratio: 1.8 (ref 1.2–2.2)
Albumin: 4.2 g/dL (ref 3.5–4.8)
Alkaline Phosphatase: 58 IU/L (ref 39–117)
BUN/Creatinine Ratio: 17 (ref 12–28)
BUN: 13 mg/dL (ref 8–27)
Bilirubin Total: 0.3 mg/dL (ref 0.0–1.2)
CO2: 25 mmol/L (ref 20–29)
Calcium: 9.5 mg/dL (ref 8.7–10.3)
Chloride: 103 mmol/L (ref 96–106)
Creatinine, Ser: 0.76 mg/dL (ref 0.57–1.00)
GFR calc Af Amer: 91 mL/min/{1.73_m2} (ref >59)
GFR calc non Af Amer: 79 mL/min/{1.73_m2} (ref >59)
Globulin, Total: 2.3 g/dL (ref 1.5–4.5)
Glucose: 101 mg/dL — ABNORMAL HIGH (ref 65–99)
Potassium: 4.5 mmol/L (ref 3.5–5.2)
Sodium: 141 mmol/L (ref 134–144)
Total Protein: 6.5 g/dL (ref 6.0–8.5)

## 2017-05-19 LAB — LIPID PANEL
Chol/HDL Ratio: 3.5 ratio (ref 0.0–4.4)
Cholesterol, Total: 199 mg/dL (ref 100–199)
HDL: 57 mg/dL (ref >39)
LDL Calculated: 120 mg/dL — ABNORMAL HIGH (ref 0–99)
Triglycerides: 112 mg/dL (ref 0–149)
VLDL Cholesterol Cal: 22 mg/dL (ref 5–40)

## 2017-05-19 LAB — TSH: TSH: 2.09 u[IU]/mL (ref 0.450–4.500)

## 2017-06-04 ENCOUNTER — Encounter: Payer: Self-pay | Admitting: Gastroenterology

## 2017-06-14 ENCOUNTER — Telehealth: Payer: Self-pay | Admitting: Family Medicine

## 2017-06-14 ENCOUNTER — Ambulatory Visit (INDEPENDENT_AMBULATORY_CARE_PROVIDER_SITE_OTHER): Payer: Medicare Other

## 2017-06-14 VITALS — BP 130/66 | HR 60 | Temp 98.2°F | Ht 65.0 in | Wt 178.8 lb

## 2017-06-14 DIAGNOSIS — Z0000 Encounter for general adult medical examination without abnormal findings: Secondary | ICD-10-CM | POA: Diagnosis not present

## 2017-06-14 NOTE — Patient Instructions (Signed)
Ms. Virginia Phillips , Thank you for taking time to come for your Medicare Wellness Visit. I appreciate your ongoing commitment to your health goals. Please review the following plan we discussed and let me know if I can assist you in the future.   Screening recommendations/referrals: Colonoscopy: Up to date Mammogram: Up to date Bone Density: Up to date Recommended yearly ophthalmology/optometry visit for glaucoma screening and checkup Recommended yearly dental visit for hygiene and checkup  Vaccinations: Influenza vaccine: N/A Pneumococcal vaccine: Up to date Tdap vaccine: Pt declines today.  Shingles vaccine: Pt declines today.     Advanced directives: Advance directive discussed with you today. Even though you declined this today please call our office should you change your mind and we can give you the proper paperwork for you to fill out.  Conditions/risks identified: Recommend increasing water intake to 6-8 glasses a day.   Next appointment: 06/19/17 @ 3:20 PM with Dr Rosanna Randy.   Preventive Care 21 Years and Older, Female Preventive care refers to lifestyle choices and visits with your health care provider that can promote health and wellness. What does preventive care include?  A yearly physical exam. This is also called an annual well check.  Dental exams once or twice a year.  Routine eye exams. Ask your health care provider how often you should have your eyes checked.  Personal lifestyle choices, including:  Daily care of your teeth and gums.  Regular physical activity.  Eating a healthy diet.  Avoiding tobacco and drug use.  Limiting alcohol use.  Practicing safe sex.  Taking low-dose aspirin every day.  Taking vitamin and mineral supplements as recommended by your health care provider. What happens during an annual well check? The services and screenings done by your health care provider during your annual well check will depend on your age, overall health,  lifestyle risk factors, and family history of disease. Counseling  Your health care provider may ask you questions about your:  Alcohol use.  Tobacco use.  Drug use.  Emotional well-being.  Home and relationship well-being.  Sexual activity.  Eating habits.  History of falls.  Memory and ability to understand (cognition).  Work and work Statistician.  Reproductive health. Screening  You may have the following tests or measurements:  Height, weight, and BMI.  Blood pressure.  Lipid and cholesterol levels. These may be checked every 5 years, or more frequently if you are over 20 years old.  Skin check.  Lung cancer screening. You may have this screening every year starting at age 56 if you have a 30-pack-year history of smoking and currently smoke or have quit within the past 15 years.  Fecal occult blood test (FOBT) of the stool. You may have this test every year starting at age 46.  Flexible sigmoidoscopy or colonoscopy. You may have a sigmoidoscopy every 5 years or a colonoscopy every 10 years starting at age 22.  Hepatitis C blood test.  Hepatitis B blood test.  Sexually transmitted disease (STD) testing.  Diabetes screening. This is done by checking your blood sugar (glucose) after you have not eaten for a while (fasting). You may have this done every 1-3 years.  Bone density scan. This is done to screen for osteoporosis. You may have this done starting at age 36.  Mammogram. This may be done every 1-2 years. Talk to your health care provider about how often you should have regular mammograms. Talk with your health care provider about your test results, treatment options, and  if necessary, the need for more tests. Vaccines  Your health care provider may recommend certain vaccines, such as:  Influenza vaccine. This is recommended every year.  Tetanus, diphtheria, and acellular pertussis (Tdap, Td) vaccine. You may need a Td booster every 10 years.  Zoster  vaccine. You may need this after age 60.  Pneumococcal 13-valent conjugate (PCV13) vaccine. One dose is recommended after age 9.  Pneumococcal polysaccharide (PPSV23) vaccine. One dose is recommended after age 73. Talk to your health care provider about which screenings and vaccines you need and how often you need them. This information is not intended to replace advice given to you by your health care provider. Make sure you discuss any questions you have with your health care provider. Document Released: 03/12/2015 Document Revised: 11/03/2015 Document Reviewed: 12/15/2014 Elsevier Interactive Patient Education  2017 [place] Prevention in the Home Falls can cause injuries. They can happen to people of all ages. There are many things you can do to make your home safe and to help prevent falls. What can I do on the outside of my home?  Regularly fix the edges of walkways and driveways and fix any cracks.  Remove anything that might make you trip as you walk through a door, such as a raised step or threshold.  Trim any bushes or trees on the path to your home.  Use bright outdoor lighting.  Clear any walking paths of anything that might make someone trip, such as rocks or tools.  Regularly check to see if handrails are loose or broken. Make sure that both sides of any steps have handrails.  Any raised decks and porches should have guardrails on the edges.  Have any leaves, snow, or ice cleared regularly.  Use sand or salt on walking paths during winter.  Clean up any spills in your garage right away. This includes oil or grease spills. What can I do in the bathroom?  Use night lights.  Install grab bars by the toilet and in the tub and shower. Do not use towel bars as grab bars.  Use non-skid mats or decals in the tub or shower.  If you need to sit down in the shower, use a plastic, non-slip stool.  Keep the floor dry. Clean up any water that spills on the  floor as soon as it happens.  Remove soap buildup in the tub or shower regularly.  Attach bath mats securely with double-sided non-slip rug tape.  Do not have throw rugs and other things on the floor that can make you trip. What can I do in the bedroom?  Use night lights.  Make sure that you have a light by your bed that is easy to reach.  Do not use any sheets or blankets that are too big for your bed. They should not hang down onto the floor.  Have a firm chair that has side arms. You can use this for support while you get dressed.  Do not have throw rugs and other things on the floor that can make you trip. What can I do in the kitchen?  Clean up any spills right away.  Avoid walking on wet floors.  Keep items that you use a lot in easy-to-reach places.  If you need to reach something above you, use a strong step stool that has a grab bar.  Keep electrical cords out of the way.  Do not use floor polish or wax that makes floors slippery. If you  must use wax, use non-skid floor wax.  Do not have throw rugs and other things on the floor that can make you trip. What can I do with my stairs?  Do not leave any items on the stairs.  Make sure that there are handrails on both sides of the stairs and use them. Fix handrails that are broken or loose. Make sure that handrails are as long as the stairways.  Check any carpeting to make sure that it is firmly attached to the stairs. Fix any carpet that is loose or worn.  Avoid having throw rugs at the top or bottom of the stairs. If you do have throw rugs, attach them to the floor with carpet tape.  Make sure that you have a light switch at the top of the stairs and the bottom of the stairs. If you do not have them, ask someone to add them for you. What else can I do to help prevent falls?  Wear shoes that:  Do not have high heels.  Have rubber bottoms.  Are comfortable and fit you well.  Are closed at the toe. Do not wear  sandals.  If you use a stepladder:  Make sure that it is fully opened. Do not climb a closed stepladder.  Make sure that both sides of the stepladder are locked into place.  Ask someone to hold it for you, if possible.  Clearly mark and make sure that you can see:  Any grab bars or handrails.  First and last steps.  Where the edge of each step is.  Use tools that help you move around (mobility aids) if they are needed. These include:  Canes.  Walkers.  Scooters.  Crutches.  Turn on the lights when you go into a dark area. Replace any light bulbs as soon as they burn out.  Set up your furniture so you have a clear path. Avoid moving your furniture around.  If any of your floors are uneven, fix them.  If there are any pets around you, be aware of where they are.  Review your medicines with your doctor. Some medicines can make you feel dizzy. This can increase your chance of falling. Ask your doctor what other things that you can do to help prevent falls. This information is not intended to replace advice given to you by your health care provider. Make sure you discuss any questions you have with your health care provider. Document Released: 12/10/2008 Document Revised: 07/22/2015 Document Reviewed: 03/20/2014 Elsevier Interactive Patient Education  2017 [employer].

## 2017-06-14 NOTE — Telephone Encounter (Signed)
no

## 2017-06-14 NOTE — Progress Notes (Signed)
Subjective:   Virginia Phillips is a 72 y.o. female who presents for Medicare Annual (Subsequent) preventive examination.  Review of Systems:  N/A  Cardiac Risk Factors include: advanced age (>22mn, >>42women);dyslipidemia;hypertension     Objective:     Vitals: BP 130/66 (BP Location: Left Arm)   Pulse 60   Temp 98.2 F (36.8 C) (Oral)   Ht 5' 5"  (1.651 m)   Wt 178 lb 12.8 oz (81.1 kg)   BMI 29.75 kg/m   Body mass index is 29.75 kg/m.  Advanced Directives 06/14/2017 06/05/2016 06/08/2015 06/02/2015 08/03/2014  Does Patient Have a Medical Advance Directive? No No No No No  Would patient like information on creating a medical advance directive? No - Patient declined - - - -    Tobacco Social History   Tobacco Use  Smoking Status Never Smoker  Smokeless Tobacco Never Used     Counseling given: Not Answered   Clinical Intake:  Pre-visit preparation completed: Yes  Pain : 0-10 Pain Score: 5  Pain Type: Chronic pain Pain Location: Foot Pain Orientation: Right Pain Descriptors / Indicators: Throbbing Pain Frequency: Intermittent     Nutritional Status: BMI 25 -29 Overweight Nutritional Risks: None Diabetes: No  How often do you need to have someone help you when you read instructions, pamphlets, or other written materials from your doctor or pharmacy?: 1 - Never  Interpreter Needed?: No  Information [facility] by :: [facility] L[facility]  Past Medical History:  Diagnosis Date  . Coronary artery disease   . GERD (gastroesophageal reflux disease)   . Hypertension   . Pre-diabetes    Past Surgical History:  Procedure Laterality Date  . ANKLE FRACTURE SURGERY  2007  . COLONOSCOPY WITH PROPOFOL N/A 05/10/2017   Procedure: COLONOSCOPY WITH PROPOFOL;  Surgeon: TVirgel Manifold MD;  Location: [facility] ENDOSCOPY;  Service: Endoscopy;  Laterality: N/A;  . GANGLION CYST EXCISION Right    wrist  . KNEE ARTHROSCOPY W/ ACL RECONSTRUCTION Right 2008  . KNEE SURGERY  08/05/2007  .  LASIK    . NISSEN FUNDOPLICATION  23009  Family History  Problem Relation Age of Onset  . Congestive Heart Failure Mother   . Parkinson's disease Mother   . Diabetes Father   . Scleroderma Sister   . Cancer Brother    Social History   Socioeconomic History  . Marital status: Married    Spouse name: Not on file  . Number of children: 1  . Years of education: Not on file  . Highest education level: Some college, no degree  Occupational History  . Occupation: retired  SScientific laboratory technician . Financial resource strain: Not hard at all  . Food insecurity:    Worry: Never true    Inability: Never true  . Transportation needs:    Medical: No    Non-medical: No  Tobacco Use  . Smoking status: Never Smoker  . Smokeless tobacco: Never Used  Substance and Sexual Activity  . Alcohol use: No    Alcohol/week: 0.0 oz  . Drug use: No  . Sexual activity: Not on file  Lifestyle  . Physical activity:    Days per week: Not on file    Minutes per session: Not on file  . Stress: Only a little  Relationships  . Social connections:    Talks on phone: Not on file    Gets together: Not on file    Attends religious service: Not on file    Active  member of club or organization: Not on file    Attends meetings of clubs or organizations: Not on file    Relationship status: Not on file  Other Topics Concern  . Not on file  Social History Narrative  . Not on file    Outpatient Encounter Medications as of 06/14/2017  Medication Sig  . aspirin 81 MG tablet Take 1 tablet by mouth daily.  Marland Kitchen Bioflavonoid Products (VITAMIN C PLUS) 500 MG TABS Take 1 tablet by mouth daily.   . Biotin 10 MG TABS Take by mouth daily.  . Chlorpheniramine-PSE-Ibuprofen 2-30-200 MG TABS Take 1 tablet by mouth as needed.   . Cholecalciferol (VITAMIN D) 2000 UNITS CAPS Take 1 capsule by mouth daily.   . Coenzyme Q10 (CO Q10) 200 MG CAPS Take 1 capsule by mouth 2 (two) times daily.  . Lancets (ONETOUCH ULTRASOFT) lancets  Check sugar once daily DX 73.9 needs for one touch ultra 2 lancets  . metoprolol succinate (TOPROL-XL) 25 MG 24 hr tablet TAKE 1 TABLET BY MOUTH EVERY DAY  . ONE TOUCH ULTRA TEST test strip CHECK BLOOD SUGAR ONCE DAILY AS DIRECTED  . Petasin (PETADOLEX PO) Take by mouth daily.  . polyethylene glycol (MIRALAX / GLYCOLAX) packet Take 17 g by mouth 2 (two) times daily.  . pravastatin (PRAVACHOL) 10 MG tablet TAKE 1 TABLET(10 MG) BY MOUTH AT BEDTIME  . Probiotic Product (PROBIOTIC-10 PO) Take by mouth daily.  . riboflavin (VITAMIN B-2) 100 MG TABS tablet Take 1 tablet by mouth daily.  Marland Kitchen venlafaxine (EFFEXOR) 75 MG tablet TAKE 1 TABLET(75 MG) BY MOUTH DAILY  . vitamin B-12 (CYANOCOBALAMIN) 100 MCG tablet Take 100 mcg by mouth daily.  . bisacodyl (BISACODYL) 5 MG EC tablet Take 5 mg by mouth daily as needed for moderate constipation.  . cycloSPORINE (RESTASIS) 0.05 % ophthalmic emulsion Apply to eye 2 (two) times daily.  Marland Kitchen lubiprostone (AMITIZA) 8 MCG capsule Take 8 mcg by mouth 2 (two) times daily with a meal.  . triamcinolone cream (KENALOG) 0.1 % Apply topically as needed. 1 Cream apply to affected area as needed (Patient not taking: Reported on 06/14/2017)  . valACYclovir (VALTREX) 1000 MG tablet Take 1 tablet (1,000 mg total) by mouth 3 (three) times daily. (Patient not taking: Reported on 06/14/2017)  . [DISCONTINUED] vitamin C (ASCORBIC ACID) 500 MG tablet Take by mouth.   No facility-administered encounter medications on file as of 06/14/2017.     Activities of Daily Living In your present state of health, do you have any difficulty performing the following activities: 06/14/2017  Hearing? N  Vision? N  Difficulty concentrating or making decisions? N  Walking or climbing stairs? Y  Comment Due to right leg and feet pain.  Dressing or bathing? N  Doing errands, shopping? N  Preparing Food and eating ? N  Using the Toilet? N  In the past six months, have you accidently leaked urine? N  Do  you have problems with loss of bowel control? N  Managing your Medications? N  Managing your Finances? N  Housekeeping or managing your Housekeeping? N  Some recent data might be hidden    Patient Care Team: Jerrol Banana., MD as PCP - General (Family Medicine) Ralene Bathe, MD as Consulting Physician (Dermatology) Ward, Honor Loh, MD as Referring Physician (Obstetrics and Gynecology) [name], MD as Consulting Physician (Gastroenterology)    Assessment:   This is a routine wellness examination for Virginia Phillips.  Exercise Activities and  Dietary recommendations Current Exercise Habits: The patient does not participate in regular exercise at present, Exercise limited by: orthopedic condition(s)  Goals    . DIET - INCREASE WATER INTAKE     Recommend increasing water intake to 6-8 glasses a day.     . Exercise 150 minutes per week (moderate activity)       Fall Risk Fall Risk  06/14/2017 11/15/2016 06/08/2015 08/03/2014  Falls in the past year? No No No No   Is the patient's home free of loose throw rugs in walkways, pet beds, electrical cords, etc?   yes      Grab bars in the bathroom? yes      Handrails on the stairs?   yes      Adequate lighting?   yes  Timed Get Up and Go performed: N/A  Depression Screen PHQ 2/9 Scores 06/14/2017 05/17/2017 11/15/2016 10/24/2016  PHQ - 2 Score 0 0 0 0  PHQ- 9 Score - - 6 -     Cognitive Function: Pt declined screening today.         Immunization History  Administered Date(s) Administered  . Influenza, High Dose Seasonal PF 01/14/2015, 12/29/2015, 11/15/2016  . Pneumococcal Conjugate-13 06/08/2015  . Pneumococcal Polysaccharide-23 12/01/2011  . Tdap 05/30/2005    Qualifies for Shingles Vaccine? Due for Shingles vaccine. Declined my offer to administer today. Education has been provided regarding the importance of this vaccine. Pt has been advised to call her insurance company to determine her out of pocket expense.  Advised she may also receive this vaccine at her local pharmacy or Health Dept. Verbalized acceptance and understanding.  Screening Tests Health Maintenance  Topic Date Due  . TETANUS/TDAP  05/31/2015  . INFLUENZA VACCINE  09/27/2017  . MAMMOGRAM  04/18/2018  . COLONOSCOPY  05/11/2022  . DEXA SCAN  Completed  . Hepatitis C Screening  Completed  . PNA vac Low Risk Adult  Completed    Cancer Screenings: Lung: Low Dose CT Chest recommended if Age 72-80 years, 30 pack-year currently smoking OR have quit w/in 15years. Patient does not qualify. Breast:  Up to date on Mammogram? Yes   Up to date of Bone Density/Dexa? Yes Colorectal: Up to date  Additional Screenings:  Hepatitis C Screening: Up to date     Plan:  I have personally reviewed and addressed the Medicare Annual Wellness questionnaire and have noted the following in the patient's chart:  A. Medical and social history B. Use of alcohol, tobacco or illicit drugs  C. Current medications and supplements D. Functional ability and status E.  Nutritional status F.  Physical activity G. Advance directives H. List of other physicians I.  Hospitalizations, surgeries, and ER visits in previous 12 months J.  [place] such as hearing and vision if needed, cognitive and depression L. Referrals and appointments - none  In addition, I have reviewed and discussed with patient certain preventive protocols, quality metrics, and best practice recommendations. A written personalized care plan for preventive services as well as general preventive health recommendations were provided to patient.  See attached scanned questionnaire for additional information.   Signed,  Fabio Neighbors, LPN Nurse Health Advisor   Nurse Recommendations: Pt declined the tetanus vaccine today.

## 2017-06-14 NOTE — Telephone Encounter (Signed)
Patient wants to get an rx for Pilocarpine 5 mg for dry mouth. She has never had this but has "researched this on the internet".  She has an appt. 06/19/17 for a f/u.  Call to [facility] on [address]

## 2017-06-14 NOTE — Telephone Encounter (Signed)
Please review

## 2017-06-15 NOTE — Telephone Encounter (Signed)
Advised  ED 

## 2017-06-19 ENCOUNTER — Encounter: Payer: Self-pay | Admitting: Family Medicine

## 2017-06-19 ENCOUNTER — Ambulatory Visit (INDEPENDENT_AMBULATORY_CARE_PROVIDER_SITE_OTHER): Payer: Medicare Other | Admitting: Family Medicine

## 2017-06-19 VITALS — BP 122/80 | HR 58 | Temp 98.3°F | Resp 14 | Wt 178.0 lb

## 2017-06-19 DIAGNOSIS — M2040 Other hammer toe(s) (acquired), unspecified foot: Secondary | ICD-10-CM

## 2017-06-19 DIAGNOSIS — R682 Dry mouth, unspecified: Secondary | ICD-10-CM

## 2017-06-19 MED ORDER — PILOCARPINE HCL 5 MG PO TABS
5.0000 mg | ORAL_TABLET | Freq: Two times a day (BID) | ORAL | 5 refills | Status: DC
Start: 2017-06-19 — End: 2017-10-25

## 2017-06-19 NOTE — Progress Notes (Signed)
Patient: Virginia Phillips Female    DOB: 07/29/1945   72 y.o.   MRN: [id number] Visit Date: 06/19/2017  Today's Provider: Wilhemena Durie, MD   Chief Complaint  Patient presents with  . Toe Pain   Subjective:    HPI Pt is here today because she wants a referral to podiatrist she has hammer toes and she would like to see Dr. Vickki Muff to get these fixed. She has seen podiatry in the past put was not happy with them. She has had family members that have seen Dr. Vickki Muff and really like him.  The pt wants to go through a list of issues today.    Allergies  Allergen Reactions  . Amoxicillin Other (See Comments)    Sleepy  . Baby Oil   . Blue Dyes (Parenteral)   . Cortisone     Face purple  . Diphenhydramine Other (See Comments)  . Doxycycline Other (See Comments)  . Esomeprazole Other (See Comments)  . Iodinated Diagnostic Agents Other (See Comments)  . Metronidazole     Redness of the face  . Nortriptyline Hcl     Migraine  . Other Other (See Comments)  . Sulfamethoxazole-Trimethoprim Other (See Comments)  . Tetracycline Other (See Comments)  . Levofloxacin Rash  . Prednisone Rash, Swelling and Other (See Comments)  . Sulfa Antibiotics Rash     Current Outpatient Medications:  .  aspirin 81 MG tablet, Take 1 tablet by mouth daily., Disp: , Rfl:  .  Bioflavonoid Products (VITAMIN C PLUS) 500 MG TABS, Take 1 tablet by mouth daily. , Disp: , Rfl:  .  Biotin 10 MG TABS, Take by mouth daily., Disp: , Rfl:  .  Cholecalciferol (VITAMIN D) 2000 UNITS CAPS, Take 1 capsule by mouth daily. , Disp: , Rfl:  .  Coenzyme Q10 (CO Q10) 200 MG CAPS, Take 1 capsule by mouth 2 (two) times daily., Disp: , Rfl:  .  Lancets (ONETOUCH ULTRASOFT) lancets, Check sugar once daily DX 73.9 needs for one touch ultra 2 lancets, Disp: 50 each, Rfl: 12 .  metoprolol succinate (TOPROL-XL) 25 MG 24 hr tablet, TAKE 1 TABLET BY MOUTH EVERY DAY, Disp: 90 tablet, Rfl: 1 .  ONE TOUCH ULTRA TEST test strip,  CHECK BLOOD SUGAR ONCE DAILY AS DIRECTED, Disp: 100 each, Rfl: 3 .  Petasin (PETADOLEX PO), Take by mouth daily., Disp: , Rfl:  .  polyethylene glycol (MIRALAX / GLYCOLAX) packet, Take 17 g by mouth 2 (two) times daily., Disp: , Rfl:  .  pravastatin (PRAVACHOL) 10 MG tablet, TAKE 1 TABLET(10 MG) BY MOUTH AT BEDTIME, Disp: 90 tablet, Rfl: 0 .  Probiotic Product (PROBIOTIC-10 PO), Take by mouth daily., Disp: , Rfl:  .  riboflavin (VITAMIN B-2) 100 MG TABS tablet, Take 1 tablet by mouth daily., Disp: , Rfl:  .  venlafaxine (EFFEXOR) 75 MG tablet, TAKE 1 TABLET(75 MG) BY MOUTH DAILY, Disp: 90 tablet, Rfl: 3 .  vitamin B-12 (CYANOCOBALAMIN) 100 MCG tablet, Take 100 mcg by mouth daily., Disp: , Rfl:  .  bisacodyl (BISACODYL) 5 MG EC tablet, Take 5 mg by mouth daily as needed for moderate constipation., Disp: , Rfl:  .  Chlorpheniramine-PSE-Ibuprofen 2-30-200 MG TABS, Take 1 tablet by mouth as needed. , Disp: , Rfl:  .  cycloSPORINE (RESTASIS) 0.05 % ophthalmic emulsion, Apply to eye 2 (two) times daily., Disp: , Rfl:  .  lubiprostone (AMITIZA) 8 MCG capsule, Take 8 mcg by mouth  2 (two) times daily with a meal., Disp: , Rfl:  .  triamcinolone cream (KENALOG) 0.1 %, Apply topically as needed. 1 Cream apply to affected area as needed (Patient not taking: Reported on 06/14/2017), Disp: 45 g, Rfl: 1 .  valACYclovir (VALTREX) 1000 MG tablet, Take 1 tablet (1,000 mg total) by mouth 3 (three) times daily. (Patient not taking: Reported on 06/14/2017), Disp: 21 tablet, Rfl: 0  Review of Systems  Constitutional: Negative.   HENT: Negative.   Eyes: Negative.   Respiratory: Negative.   Cardiovascular: Negative.   Gastrointestinal: Negative.   Endocrine: Negative.   Genitourinary: Negative.   Musculoskeletal: Positive for arthralgias.  Skin: Negative.   Allergic/Immunologic: Negative.   Neurological: Negative.   Hematological: Negative.   Psychiatric/Behavioral: Negative.     Social History   Tobacco Use   . Smoking status: Never Smoker  . Smokeless tobacco: Never Used  Substance Use Topics  . Alcohol use: No    Alcohol/week: 0.0 oz   Objective:   BP 122/80 (BP Location: Left Arm, Patient Position: Sitting, Cuff Size: Large)   Pulse (!) 58   Temp 98.3 F (36.8 C) (Oral)   Resp 14   Wt 178 lb (80.7 kg)   BMI 29.62 kg/m  Vitals:   06/19/17 1545  BP: 122/80  Pulse: (!) 58  Resp: 14  Temp: 98.3 F (36.8 C)  TempSrc: Oral  Weight: 178 lb (80.7 kg)     Physical Exam  Constitutional: She is oriented to person, place, and time. She appears well-developed and well-nourished.  HENT:  Head: Normocephalic and atraumatic.  Eyes: No scleral icterus.  Neck: No thyromegaly present.  Cardiovascular: Normal rate and regular rhythm.  Pulmonary/Chest: Effort normal.  Abdominal: Soft.  Musculoskeletal:  Mild Hammer Toes.  Neurological: She is alert and oriented to person, place, and time.  Skin: Skin is warm and dry.  Psychiatric: She has a normal mood and affect.        Assessment & Plan:     Hammer Toes Chronic Anxiety Chronic Constipation Per GI. Dry Mouth  Bothersome per pt. Gyn has written Pilocarpine before--17m TID prn.      I have done the exam and reviewed the above chart and it is accurate to the best of my knowledge. DDevelopment worker, communityhas been used in this note in any air is in the dictation or transcription are unintentional.  RWilhemena Durie MD  BAnderson

## 2017-06-28 DIAGNOSIS — L719 Rosacea, unspecified: Secondary | ICD-10-CM | POA: Diagnosis not present

## 2017-07-10 DIAGNOSIS — M2041 Other hammer toe(s) (acquired), right foot: Secondary | ICD-10-CM | POA: Diagnosis not present

## 2017-07-10 DIAGNOSIS — M79671 Pain in right foot: Secondary | ICD-10-CM | POA: Diagnosis not present

## 2017-08-02 ENCOUNTER — Other Ambulatory Visit: Payer: Self-pay | Admitting: Family Medicine

## 2017-08-14 ENCOUNTER — Ambulatory Visit: Payer: Medicare Other | Admitting: Gastroenterology

## 2017-08-14 DIAGNOSIS — R5383 Other fatigue: Secondary | ICD-10-CM | POA: Diagnosis not present

## 2017-08-14 DIAGNOSIS — R001 Bradycardia, unspecified: Secondary | ICD-10-CM | POA: Diagnosis not present

## 2017-08-14 DIAGNOSIS — I10 Essential (primary) hypertension: Secondary | ICD-10-CM | POA: Insufficient documentation

## 2017-08-14 DIAGNOSIS — E782 Mixed hyperlipidemia: Secondary | ICD-10-CM | POA: Diagnosis not present

## 2017-08-14 DIAGNOSIS — G4733 Obstructive sleep apnea (adult) (pediatric): Secondary | ICD-10-CM | POA: Diagnosis not present

## 2017-08-14 DIAGNOSIS — R42 Dizziness and giddiness: Secondary | ICD-10-CM | POA: Diagnosis not present

## 2017-08-14 DIAGNOSIS — R079 Chest pain, unspecified: Secondary | ICD-10-CM | POA: Diagnosis not present

## 2017-08-22 ENCOUNTER — Encounter: Payer: Self-pay | Admitting: Gastroenterology

## 2017-08-22 ENCOUNTER — Ambulatory Visit (INDEPENDENT_AMBULATORY_CARE_PROVIDER_SITE_OTHER): Payer: Medicare Other | Admitting: Gastroenterology

## 2017-08-22 VITALS — BP 144/75 | HR 109 | Wt 178.8 lb

## 2017-08-22 DIAGNOSIS — K449 Diaphragmatic hernia without obstruction or gangrene: Secondary | ICD-10-CM | POA: Diagnosis not present

## 2017-08-22 DIAGNOSIS — R140 Abdominal distension (gaseous): Secondary | ICD-10-CM | POA: Diagnosis not present

## 2017-08-22 NOTE — Addendum Note (Signed)
Addended by: Earl Lagos on: 08/22/2017 05:03 PM   Modules accepted: Orders

## 2017-08-22 NOTE — Progress Notes (Signed)
Virginia Antigua, MD [address]  [place]  [place], [place] [address]  Main: [phone]  Fax: [phone]   Primary Care Physician: Ward, Honor Loh, MD  Primary Gastroenterologist:  Dr. Vonda Phillips  Chief Complaint  Patient presents with  . Follow-up    colonoscopy    HPI: Virginia Phillips is a 72 y.o. female here for follow-up of constipation.  Patient takes MiraLAX twice daily, and with that she is having 1-2 soft problems daily.  She describes them as mushy.  No blood in stool, no weight loss, no nausea or vomiting.  Reports intermittent abdominal bloating.  Her other complaint, is her bra size changing daily, and her having to wear a size 44 to a size 50 bra depending on how tight her bra feels that day due to bloating.  She reports history of hiatal hernia repair in 2011.  She recently was seen by cardiology due to bradycardia, and medications are being changed by them.  Current Outpatient Medications  Medication Sig Dispense Refill  . aspirin 81 MG tablet Take 1 tablet by mouth daily.    Marland Kitchen Bioflavonoid Products (VITAMIN C PLUS) 500 MG TABS Take 1 tablet by mouth daily.     . Biotin 10 MG TABS Take by mouth daily.    . bisacodyl (BISACODYL) 5 MG EC tablet Take 5 mg by mouth daily as needed for moderate constipation.    . Chlorpheniramine-PSE-Ibuprofen 2-30-200 MG TABS Take 1 tablet by mouth as needed.     . Cholecalciferol (VITAMIN D) 2000 UNITS CAPS Take 1 capsule by mouth daily.     . Coenzyme Q10 (CO Q10) 200 MG CAPS Take 1 capsule by mouth 2 (two) times daily.    . cycloSPORINE (RESTASIS) 0.05 % ophthalmic emulsion Apply to eye 2 (two) times daily.    . Lancets (ONETOUCH ULTRASOFT) lancets Check sugar once daily DX 73.9 needs for one touch ultra 2 lancets 50 each 12  . lubiprostone (AMITIZA) 8 MCG capsule Take 8 mcg by mouth 2 (two) times daily with a meal.    . metoprolol succinate (TOPROL-XL) 25 MG 24 hr tablet TAKE 1 TABLET BY MOUTH EVERY DAY 90  tablet 1  . ONE TOUCH ULTRA TEST test strip CHECK BLOOD SUGAR ONCE DAILY AS DIRECTED 100 each 3  . Petasin (PETADOLEX PO) Take by mouth daily.    . pilocarpine (SALAGEN) 5 MG tablet Take 1 tablet (5 mg total) by mouth 2 (two) times daily. 90 tablet 5  . polyethylene glycol (MIRALAX / GLYCOLAX) packet Take 17 g by mouth 2 (two) times daily.    . pravastatin (PRAVACHOL) 10 MG tablet TAKE 1 TABLET(10 MG) BY MOUTH AT BEDTIME 90 tablet 3  . Probiotic Product (PROBIOTIC-10 PO) Take by mouth daily.    . riboflavin (VITAMIN B-2) 100 MG TABS tablet Take 1 tablet by mouth daily.    Marland Kitchen triamcinolone cream (KENALOG) 0.1 % Apply topically as needed. 1 Cream apply to affected area as needed (Patient not taking: Reported on 06/14/2017) 45 g 1  . valACYclovir (VALTREX) 1000 MG tablet Take 1 tablet (1,000 mg total) by mouth 3 (three) times daily. (Patient not taking: Reported on 06/14/2017) 21 tablet 0  . venlafaxine (EFFEXOR) 75 MG tablet TAKE 1 TABLET(75 MG) BY MOUTH DAILY 90 tablet 3  . vitamin B-12 (CYANOCOBALAMIN) 100 MCG tablet Take 100 mcg by mouth daily.     No current facility-administered medications for this visit.     Allergies as of  08/22/2017 - Review Complete 08/22/2017  Allergen Reaction Noted  . Amoxicillin Other (See Comments) 07/31/2014  . Baby oil  07/31/2014  . Blue dyes (parenteral)  07/31/2014  . Cortisone  07/31/2014  . Diphenhydramine Other (See Comments) 07/31/2014  . Doxycycline Other (See Comments) 07/31/2014  . Esomeprazole Other (See Comments) 07/31/2014  . Iodinated diagnostic agents Other (See Comments) 07/31/2014  . Metronidazole  06/08/2016  . Nortriptyline hcl  07/31/2014  . Other Other (See Comments) 07/31/2014  . Sulfamethoxazole-trimethoprim Other (See Comments) 06/02/2015  . Tetracycline Other (See Comments) 08/03/2014  . Levofloxacin Rash 07/31/2014  . Prednisone Rash, Swelling, and Other (See Comments) 10/21/2013  . Sulfa antibiotics Rash 07/31/2014     ROS:  General: Negative for anorexia, weight loss, fever, chills, fatigue, weakness. ENT: Negative for hoarseness, difficulty swallowing , nasal congestion. CV: Negative for chest pain, angina, palpitations, dyspnea on exertion, peripheral edema.  Respiratory: Negative for dyspnea at rest, dyspnea on exertion, cough, sputum, wheezing.  GI: See history of present illness. GU:  Negative for dysuria, hematuria, urinary incontinence, urinary frequency, nocturnal urination.  Endo: Negative for unusual weight change.    Physical Examination:   BP (!) 144/75   Pulse (!) 109   Wt 178 lb 12.8 oz (81.1 kg)   BMI 29.75 kg/m   General: Well-nourished, well-developed in no acute distress.  Eyes: No icterus. Conjunctivae pink. Mouth: Oropharyngeal mucosa moist and pink , no lesions erythema or exudate. Neck: Supple, Trachea midline Chest: Bra strap noted to be loose under her breast Abdomen: Bowel sounds are normal, nontender, nondistended, no hepatosplenomegaly or masses, no abdominal bruits or hernia , no rebound or guarding.   Extremities: No lower extremity edema. No clubbing or deformities. Neuro: Alert and oriented x 3.  Grossly intact. Skin: Warm and dry, no jaundice.   Psych: Alert and cooperative, normal mood and affect.   Labs: CMP     Component Value Date/Time   NA 141 05/18/2017 1132   NA 138 06/17/2013 2031   K 4.5 05/18/2017 1132   K 4.1 06/17/2013 2031   CL 103 05/18/2017 1132   CL 105 06/17/2013 2031   CO2 25 05/18/2017 1132   CO2 24 06/17/2013 2031   GLUCOSE 101 (H) 05/18/2017 1132   GLUCOSE 105 (H) 12/14/2016 1339   GLUCOSE 151 (H) 06/17/2013 2031   BUN 13 05/18/2017 1132   BUN 19 (H) 06/17/2013 2031   CREATININE 0.76 05/18/2017 1132   CREATININE 0.79 12/14/2016 1339   CALCIUM 9.5 05/18/2017 1132   CALCIUM 10.8 (H) 06/17/2013 2031   PROT 6.5 05/18/2017 1132   PROT 9.0 (H) 06/17/2013 2031   ALBUMIN 4.2 05/18/2017 1132   ALBUMIN 4.9 06/17/2013 2031   AST  26 05/18/2017 1132   AST 39 (H) 06/17/2013 2031   ALT 26 05/18/2017 1132   ALT 32 06/17/2013 2031   ALKPHOS 58 05/18/2017 1132   ALKPHOS 78 06/17/2013 2031   BILITOT 0.3 05/18/2017 1132   BILITOT 0.3 06/17/2013 2031   GFRNONAA 79 05/18/2017 1132   GFRNONAA 75 12/14/2016 1339   GFRAA 91 05/18/2017 1132   GFRAA 87 12/14/2016 1339   Lab Results  Component Value Date   WBC 6.4 05/18/2017   HGB 14.0 05/18/2017   HCT 41.8 05/18/2017   MCV 95 05/18/2017   PLT 259 05/18/2017    Imaging Studies: No results found.  Assessment and Plan:   Virginia Phillips is a 72 y.o. y/o female here for follow-up of constipation  Constipation  resolved with high-fiber diet and MiraLAX daily I have asked her to make her second dose of MiraLAX during the day, half a dose, she is reporting mushy stools  She is asking, if we can perform an EGD to see the status of her hiatal hernia She states she had a Nissen fundoplication in 2909 due to severe heartburn She has no symptoms of heartburn, indigestion, or acid reflux Her main complaint is bloating, and this is not an indication for EGD In addition, if her Nissen fundoplication is now unwrapped, unless she is having symptoms from the hiatal hernia, repeat surgery is not likely indicated, therefore an EGD would not lead to change in management In addition, patient had recent symptomatic bradycardia, and cardiology is changing around her medications, and thus risks of procedure would be higher than the benefits at this time  We will obtain upper GI study, to evaluate her hiatal hernia, and rule out peptic ulcer disease as well  We will also obtain stool for H. pylori, as I could be leading to her bloating  I have discussed with her, that her bra size changing may be due to her wearing the wrong size bras, and have advised her to have measurements done and wear correct bra size.    Dr Virginia Phillips

## 2017-08-29 ENCOUNTER — Ambulatory Visit
Admission: RE | Admit: 2017-08-29 | Discharge: 2017-08-29 | Disposition: A | Discharge status: Home / Self Care | Payer: Medicare Other | Source: Ambulatory Visit | Attending: Gastroenterology | Admitting: Gastroenterology

## 2017-08-29 ENCOUNTER — Other Ambulatory Visit: Payer: Self-pay

## 2017-08-29 DIAGNOSIS — K449 Diaphragmatic hernia without obstruction or gangrene: Secondary | ICD-10-CM

## 2017-08-29 DIAGNOSIS — R1110 Vomiting, unspecified: Secondary | ICD-10-CM | POA: Diagnosis not present

## 2017-08-29 DIAGNOSIS — R140 Abdominal distension (gaseous): Secondary | ICD-10-CM

## 2017-08-29 DIAGNOSIS — Z9889 Other specified postprocedural states: Secondary | ICD-10-CM | POA: Insufficient documentation

## 2017-08-29 DIAGNOSIS — Z09 Encounter for follow-up examination after completed treatment for conditions other than malignant neoplasm: Secondary | ICD-10-CM | POA: Insufficient documentation

## 2017-09-03 DIAGNOSIS — R140 Abdominal distension (gaseous): Secondary | ICD-10-CM | POA: Diagnosis not present

## 2017-09-05 LAB — H. PYLORI ANTIGEN, STOOL: H pylori Ag, Stl: NEGATIVE

## 2017-09-06 ENCOUNTER — Telehealth: Payer: Self-pay | Admitting: Gastroenterology

## 2017-09-06 NOTE — Telephone Encounter (Signed)
Pt is calling for test results

## 2017-09-07 NOTE — Telephone Encounter (Signed)
Pt.notified

## 2017-09-10 DIAGNOSIS — I10 Essential (primary) hypertension: Secondary | ICD-10-CM | POA: Diagnosis not present

## 2017-09-10 DIAGNOSIS — G4733 Obstructive sleep apnea (adult) (pediatric): Secondary | ICD-10-CM | POA: Diagnosis not present

## 2017-09-10 DIAGNOSIS — E782 Mixed hyperlipidemia: Secondary | ICD-10-CM | POA: Diagnosis not present

## 2017-09-17 ENCOUNTER — Ambulatory Visit: Payer: Medicare Other | Admitting: Gastroenterology

## 2017-09-18 ENCOUNTER — Ambulatory Visit: Payer: Medicare Other | Admitting: Gastroenterology

## 2017-09-19 ENCOUNTER — Ambulatory Visit: Payer: Medicare Other | Admitting: Gastroenterology

## 2017-09-24 ENCOUNTER — Ambulatory Visit (INDEPENDENT_AMBULATORY_CARE_PROVIDER_SITE_OTHER): Payer: Medicare Other | Admitting: Gastroenterology

## 2017-09-24 ENCOUNTER — Encounter: Payer: Self-pay | Admitting: Gastroenterology

## 2017-09-24 VITALS — BP 119/73 | HR 78 | Wt 176.2 lb

## 2017-09-24 DIAGNOSIS — R140 Abdominal distension (gaseous): Secondary | ICD-10-CM

## 2017-09-24 DIAGNOSIS — K5900 Constipation, unspecified: Secondary | ICD-10-CM | POA: Diagnosis not present

## 2017-09-24 DIAGNOSIS — K3189 Other diseases of stomach and duodenum: Secondary | ICD-10-CM | POA: Diagnosis not present

## 2017-09-24 NOTE — Progress Notes (Signed)
[name], MD [address]  [place]  [place], [place] [address]  Main: [phone]  Fax: [phone]   Primary Care Physician: Ward, Honor Loh, MD  Primary Gastroenterologist:  Dr. [name]  Chief Complaint  Patient presents with  . Follow-up    constipation    HPI: Virginia Phillips is a 72 y.o. female here for follow-up of constipation and bloating.  Takes MiraLAX in reporting 1-2 soft bowel movements daily with that.  Reports intermittent abdominal bloating, but no abdominal pain.  No heartburn.  History of hiatal hernia repair in 2011, which resolved her heartburn completely.  On last visit, patient inquired about doing an EGD as she wanted to know if her fundoplication was still in place.  Since she had recent bradycardia prior to that office visit, and cardiology was changing around her medications due to this, we elected to proceed with an upper GI study instead of EGD to prevent risks of the procedure.  This showed considerable retained food particles within the stomach.  No evidence of ulceration.  Somewhat slow gastric emptying study.  Relatively tight esophageal wrap nonobstructive passage of liquid barium but obstructive to passage of the barium tablet was reported.  Patient denies any dysphasia.  Patient reports fasting 12 hours prior to that upper GI study.  Current Outpatient Medications  Medication Sig Dispense Refill  . aspirin 81 MG tablet Take 1 tablet by mouth daily.    Marland Kitchen b complex vitamins tablet Take 1 tablet by mouth daily.    Marland Kitchen Bioflavonoid Products (VITAMIN C PLUS) 500 MG TABS Take 1 tablet by mouth daily.     . Biotin 10 MG TABS Take by mouth daily.    . Chlorpheniramine-PSE-Ibuprofen 2-30-200 MG TABS Take 1 tablet by mouth as needed.     . Cholecalciferol (VITAMIN D) 2000 UNITS CAPS Take 1 capsule by mouth daily.     . Coenzyme Q10 (CO Q10) 200 MG CAPS Take 1 capsule by mouth 2 (two) times daily.    . Lancets (ONETOUCH ULTRASOFT)  lancets Check sugar once daily DX 73.9 needs for one touch ultra 2 lancets 50 each 12  . lubiprostone (AMITIZA) 8 MCG capsule Take 8 mcg by mouth 2 (two) times daily with a meal.    . metoprolol succinate (TOPROL-XL) 25 MG 24 hr tablet TAKE 1 TABLET BY MOUTH EVERY DAY 90 tablet 1  . ONE TOUCH ULTRA TEST test strip CHECK BLOOD SUGAR ONCE DAILY AS DIRECTED 100 each 3  . Petasin (PETADOLEX PO) Take by mouth daily.    . pilocarpine (SALAGEN) 5 MG tablet Take 1 tablet (5 mg total) by mouth 2 (two) times daily. 90 tablet 5  . polyethylene glycol (MIRALAX / GLYCOLAX) packet Take 17 g by mouth 2 (two) times daily.    . pravastatin (PRAVACHOL) 10 MG tablet TAKE 1 TABLET(10 MG) BY MOUTH AT BEDTIME 90 tablet 3  . Probiotic Product (PROBIOTIC-10 PO) Take by mouth daily.    . riboflavin (VITAMIN B-2) 100 MG TABS tablet Take 1 tablet by mouth daily.    Marland Kitchen triamcinolone cream (KENALOG) 0.1 % Apply topically as needed. 1 Cream apply to affected area as needed 45 g 1  . valsartan (DIOVAN) 40 MG tablet TK 1 T PO ONCE D  11  . venlafaxine (EFFEXOR) 75 MG tablet TAKE 1 TABLET(75 MG) BY MOUTH DAILY 90 tablet 3  . vitamin B-12 (CYANOCOBALAMIN) 100 MCG tablet Take 100 mcg by mouth daily.  No current facility-administered medications for this visit.     Allergies as of 09/24/2017 - Review Complete 09/24/2017  Allergen Reaction Noted  . Amoxicillin Other (See Comments) 07/31/2014  . Baby oil  07/31/2014  . Blue dyes (parenteral)  07/31/2014  . Cortisone  07/31/2014  . Diphenhydramine Other (See Comments) 07/31/2014  . Doxycycline Other (See Comments) 07/31/2014  . Esomeprazole Other (See Comments) 07/31/2014  . Iodinated diagnostic agents Other (See Comments) 07/31/2014  . Metronidazole  06/08/2016  . Nortriptyline hcl  07/31/2014  . Other Other (See Comments) 07/31/2014  . Sulfamethoxazole-trimethoprim Other (See Comments) 06/02/2015  . Tetracycline Other (See Comments) 08/03/2014  . Levofloxacin Rash  07/31/2014  . Prednisone Rash, Swelling, and Other (See Comments) 10/21/2013  . Sulfa antibiotics Rash 07/31/2014    ROS:  General: Negative for anorexia, weight loss, fever, chills, fatigue, weakness. ENT: Negative for hoarseness, difficulty swallowing , nasal congestion. CV: Negative for chest pain, angina, palpitations, dyspnea on exertion, peripheral edema.  Respiratory: Negative for dyspnea at rest, dyspnea on exertion, cough, sputum, wheezing.  GI: See history of present illness. GU:  Negative for dysuria, hematuria, urinary incontinence, urinary frequency, nocturnal urination.  Endo: Negative for unusual weight change.    Physical Examination:   BP 119/73   Pulse 78   Wt 176 lb 3.2 oz (79.9 kg)   BMI 29.32 kg/m   General: Well-nourished, well-developed in no acute distress.  Eyes: No icterus. Conjunctivae pink. Mouth: Oropharyngeal mucosa moist and pink , no lesions erythema or exudate. Neck: Supple, Trachea midline Abdomen: Bowel sounds are normal, nontender, nondistended, no hepatosplenomegaly or masses, no abdominal bruits or hernia , no rebound or guarding.   Extremities: No lower extremity edema. No clubbing or deformities. Neuro: Alert and oriented x 3.  Grossly intact. Skin: Warm and dry, no jaundice.   Psych: Alert and cooperative, normal mood and affect.   Labs: CMP     Component Value Date/Time   NA 141 05/18/2017 1132   NA 138 06/17/2013 2031   K 4.5 05/18/2017 1132   K 4.1 06/17/2013 2031   CL 103 05/18/2017 1132   CL 105 06/17/2013 2031   CO2 25 05/18/2017 1132   CO2 24 06/17/2013 2031   GLUCOSE 101 (H) 05/18/2017 1132   GLUCOSE 105 (H) 12/14/2016 1339   GLUCOSE 151 (H) 06/17/2013 2031   BUN 13 05/18/2017 1132   BUN 19 (H) 06/17/2013 2031   CREATININE 0.76 05/18/2017 1132   CREATININE 0.79 12/14/2016 1339   CALCIUM 9.5 05/18/2017 1132   CALCIUM 10.8 (H) 06/17/2013 2031   PROT 6.5 05/18/2017 1132   PROT 9.0 (H) 06/17/2013 2031   ALBUMIN 4.2  05/18/2017 1132   ALBUMIN 4.9 06/17/2013 2031   AST 26 05/18/2017 1132   AST 39 (H) 06/17/2013 2031   ALT 26 05/18/2017 1132   ALT 32 06/17/2013 2031   ALKPHOS 58 05/18/2017 1132   ALKPHOS 78 06/17/2013 2031   BILITOT 0.3 05/18/2017 1132   BILITOT 0.3 06/17/2013 2031   GFRNONAA 79 05/18/2017 1132   GFRNONAA 75 12/14/2016 1339   GFRAA 91 05/18/2017 1132   GFRAA 87 12/14/2016 1339   Lab Results  Component Value Date   WBC 6.4 05/18/2017   HGB 14.0 05/18/2017   HCT 41.8 05/18/2017   MCV 95 05/18/2017   PLT 259 05/18/2017    Imaging Studies: Dg Ugi W/high Density W/kub  Result Date: 08/29/2017 CLINICAL DATA:  History of hiatal hernia repair but has sensation of something having  come loose after vomiting. Sensation of intra-abdominal structure shifting. Chronic constipation. EXAM: UPPER GI SERIES WITH KUB TECHNIQUE: After obtaining a scout radiograph a routine upper GI series was performed using thin barium. Fluoroscopy Time:  1 minutes, 36 seconds Radiation Exposure Index (if provided by the fluoroscopic device): 71.1 mGy Number of Acquired Spot Images: 0 COMPARISON:  Abdominal and pelvic CT scan of June 18, 2013 and gastric emptying study dated February 12, 2009. FINDINGS: The scout film revealed moderately increase colonic stool burden. No obstructive pattern was observed. The patient ingested thin barium without difficulty. The GE junction was noted to be relatively tight and [place] effervescent crystals were not administered. The thoracic esophagus distended reasonably well and esophageal motility was grossly normal. The administered barium tablet hung at the GE junction and was allowed to dissolve. Barium slowly entered the stomach and it was noted that there was considerable retained food particles despite an approximately 12 hour fast. Gastric emptying occurred but with relatively slow. The contour of the stomach was grossly normal. No ulcer niche was demonstrated. IMPRESSION: Considerable  retained food particles within the stomach. A bezoar is not excluded. No evidence of ulceration. Somewhat slow gastric emptying. It may be useful to repeat the nuclear medicine gastric emptying study despite the normal earlier gastric emptying study in 2010. No evidence of esophagitis. Relatively tight esophageal wrap not obstructive to passage of liquid barium but obstructive to passage of the barium tablet. Electronically Signed   By: David  Martinique M.D.   On: 08/29/2017 10:34    Assessment and Plan:   SHAMYAH STANTZ is a 72 y.o. y/o female here for follow-up of abdominal bloating, with upper GI study above reporting considerable retained food particles within the stomach despite a 12-hour fast, relatively tight esophageal wrap from her previous hiatal hernia surgery  Due to retained food particles within the stomach, EGD is indicated to rule out any obstructive lesions Gastric emptying study can be considered after ruling out obstructive lesions on the EGD Patient has been evaluated by Dr. Nehemiah Massed and has been cleared for procedures.  See care everywhere   "-Proceed to surgery and/or invasive procedure without restriction to pre or post operative and/or procedural care. The patient is at lowest risk possible for cardiovascular complications with surgical intervention and/or invasive procedure. Currently has no evidence active and/or significant angina and/or congestive heart failure. The patient may discontinue aspirin 7 days prior to procedure and restart at a safe period thereafter ... Flossie Dibble, MD "  We will thus proceed with EGD, patient is agreeable with the plan I have discussed alternative options, risks & benefits,  which include, but are not limited to, bleeding, infection, perforation,respiratory complication & drug reaction.  The patient agrees with this plan & written consent will be obtained.    Colonoscopy up-to-date in March 2019, repeat recommended in 5 years  Dr  [name]

## 2017-09-24 NOTE — Patient Instructions (Signed)
F/U 6 months

## 2017-09-26 ENCOUNTER — Other Ambulatory Visit: Payer: Self-pay

## 2017-09-26 DIAGNOSIS — R1084 Generalized abdominal pain: Secondary | ICD-10-CM

## 2017-10-15 ENCOUNTER — Telehealth: Payer: Self-pay | Admitting: Gastroenterology

## 2017-10-15 NOTE — Telephone Encounter (Signed)
Pt is calling to find out if she can continue to take rx petadolex with b12 she is scheduled for procedure 10/17/17 per Jackelyn Poling pt is ok to take until the day of procedure. Pt aware

## 2017-10-16 ENCOUNTER — Encounter: Payer: Self-pay | Admitting: *Deleted

## 2017-10-17 ENCOUNTER — Ambulatory Visit: Payer: Medicare Other | Admitting: Certified Registered Nurse Anesthetist

## 2017-10-17 ENCOUNTER — Other Ambulatory Visit: Payer: Self-pay

## 2017-10-17 ENCOUNTER — Encounter
Admission: RE | Disposition: A | Discharge status: Home / Self Care | Payer: Self-pay | Source: Ambulatory Visit | Attending: Gastroenterology

## 2017-10-17 ENCOUNTER — Ambulatory Visit
Admission: RE | Admit: 2017-10-17 | Discharge: 2017-10-17 | Disposition: A | Discharge status: Home / Self Care | Payer: Medicare Other | Source: Ambulatory Visit | Attending: Gastroenterology | Admitting: Gastroenterology

## 2017-10-17 DIAGNOSIS — Z79899 Other long term (current) drug therapy: Secondary | ICD-10-CM | POA: Insufficient documentation

## 2017-10-17 DIAGNOSIS — Z8249 Family history of ischemic heart disease and other diseases of the circulatory system: Secondary | ICD-10-CM | POA: Diagnosis not present

## 2017-10-17 DIAGNOSIS — I10 Essential (primary) hypertension: Secondary | ICD-10-CM | POA: Diagnosis not present

## 2017-10-17 DIAGNOSIS — R109 Unspecified abdominal pain: Secondary | ICD-10-CM | POA: Diagnosis not present

## 2017-10-17 DIAGNOSIS — R140 Abdominal distension (gaseous): Secondary | ICD-10-CM | POA: Diagnosis not present

## 2017-10-17 DIAGNOSIS — K219 Gastro-esophageal reflux disease without esophagitis: Secondary | ICD-10-CM | POA: Diagnosis not present

## 2017-10-17 DIAGNOSIS — I2510 Atherosclerotic heart disease of native coronary artery without angina pectoris: Secondary | ICD-10-CM | POA: Diagnosis not present

## 2017-10-17 DIAGNOSIS — Z7982 Long term (current) use of aspirin: Secondary | ICD-10-CM | POA: Diagnosis not present

## 2017-10-17 DIAGNOSIS — R7303 Prediabetes: Secondary | ICD-10-CM | POA: Insufficient documentation

## 2017-10-17 DIAGNOSIS — R933 Abnormal findings on diagnostic imaging of other parts of digestive tract: Secondary | ICD-10-CM | POA: Diagnosis not present

## 2017-10-17 DIAGNOSIS — R1084 Generalized abdominal pain: Secondary | ICD-10-CM

## 2017-10-17 DIAGNOSIS — K3189 Other diseases of stomach and duodenum: Secondary | ICD-10-CM | POA: Diagnosis not present

## 2017-10-17 DIAGNOSIS — E785 Hyperlipidemia, unspecified: Secondary | ICD-10-CM | POA: Diagnosis not present

## 2017-10-17 DIAGNOSIS — L538 Other specified erythematous conditions: Secondary | ICD-10-CM | POA: Diagnosis not present

## 2017-10-17 HISTORY — PX: ESOPHAGOGASTRODUODENOSCOPY (EGD) WITH PROPOFOL: SHX5813

## 2017-10-17 SURGERY — ESOPHAGOGASTRODUODENOSCOPY (EGD) WITH PROPOFOL
Anesthesia: General

## 2017-10-17 MED ORDER — SODIUM CHLORIDE 0.9 % IV SOLN
INTRAVENOUS | Status: DC
  Administered 2017-10-17: 11:00:00 via INTRAVENOUS

## 2017-10-17 MED ORDER — PROPOFOL 500 MG/50ML IV EMUL
INTRAVENOUS | Status: DC | PRN
  Administered 2017-10-17: 150 ug/kg/min via INTRAVENOUS

## 2017-10-17 MED ORDER — PROPOFOL 10 MG/ML IV BOLUS
INTRAVENOUS | Status: AC
  Filled 2017-10-17: qty 20

## 2017-10-17 MED ORDER — LIDOCAINE HCL (PF) 2 % IJ SOLN
INTRAMUSCULAR | Status: AC
  Filled 2017-10-17: qty 10

## 2017-10-17 MED ORDER — PROPOFOL 10 MG/ML IV BOLUS
INTRAVENOUS | Status: DC | PRN
  Administered 2017-10-17: 60 mg via INTRAVENOUS
  Administered 2017-10-17 (×2): 20 mg via INTRAVENOUS

## 2017-10-17 MED ORDER — LIDOCAINE HCL (CARDIAC) PF 100 MG/5ML IV SOSY
PREFILLED_SYRINGE | INTRAVENOUS | Status: DC | PRN
  Administered 2017-10-17: 50 mg via INTRAVENOUS

## 2017-10-17 NOTE — Transfer of Care (Signed)
Immediate Anesthesia Transfer of Care Note  Patient: Virginia Phillips  Procedure(s) Performed: ESOPHAGOGASTRODUODENOSCOPY (EGD) WITH PROPOFOL (N/A )  Patient Location: PACU  Anesthesia Type:General  Level of Consciousness: drowsy and patient cooperative  Airway & Oxygen Therapy: Patient Spontanous Breathing  Post-op Assessment: Report given to RN and Post -op Vital signs reviewed and stable  Post vital signs: Reviewed and stable  Last Vitals:  Vitals Value Taken Time  BP 103/58 10/17/2017 12:12 PM  Temp 35.8 C 10/17/2017 12:12 PM  Pulse 73 10/17/2017 12:12 PM  Resp 16 10/17/2017 12:12 PM  SpO2 95 % 10/17/2017 12:12 PM    Last Pain:  Vitals:   10/17/17 1212  PainSc: 0-No pain         Complications: No apparent anesthesia complications

## 2017-10-17 NOTE — H&P (Signed)
[name], MD [address], [place], [place], [place], [address] [address], [place], [place], [place], [address] Phone: [phone]  Fax: [phone]  Primary Care Physician:  Ward, Honor Loh, MD   Pre-Procedure History & Physical: HPI:  Virginia Phillips is a 72 y.o. female is here for an EGD.   Past Medical History:  Diagnosis Date  . Coronary artery disease   . GERD (gastroesophageal reflux disease)   . Hypertension   . Pre-diabetes     Past Surgical History:  Procedure Laterality Date  . ANKLE FRACTURE SURGERY  2007  . COLONOSCOPY WITH PROPOFOL N/A 05/10/2017   Procedure: COLONOSCOPY WITH PROPOFOL;  Surgeon: [name], MD;  Location: [facility] ENDOSCOPY;  Service: Endoscopy;  Laterality: N/A;  . GANGLION CYST EXCISION Right    wrist  . KNEE ARTHROSCOPY W/ ACL RECONSTRUCTION Right 2008  . KNEE SURGERY  08/05/2007  . LASIK    . NISSEN FUNDOPLICATION  9563    Prior to Admission medications   Medication Sig Start Date End Date Taking? Authorizing Provider  Chlorpheniramine-PSE-Ibuprofen 2-30-200 MG TABS Take 1 tablet by mouth as needed.    Yes [provider]  Petasin (PETADOLEX PO) Take by mouth daily.   Yes [provider]  pravastatin (PRAVACHOL) 10 MG tablet TAKE 1 TABLET(10 MG) BY MOUTH AT BEDTIME 08/02/17  Yes Jerrol Banana., MD  riboflavin (VITAMIN B-2) 100 MG TABS tablet Take 1 tablet by mouth daily.   Yes [provider]  triamcinolone cream (KENALOG) 0.1 % Apply topically as needed. 1 Cream apply to affected area as needed 06/09/15  Yes Margarita Rana, MD  valsartan (DIOVAN) 80 MG tablet TK 1 T PO ONCE A DAY 09/10/17  Yes [provider]  venlafaxine (EFFEXOR) 75 MG tablet TAKE 1 TABLET(75 MG) BY MOUTH DAILY 02/14/17  Yes Jerrol Banana., MD  aspirin 81 MG tablet Take 1 tablet by mouth daily.    [provider]  b complex vitamins tablet Take 1 tablet by mouth daily.    [provider]  Bioflavonoid Products (VITAMIN C PLUS) 500 MG TABS Take 1 tablet by mouth daily.     [provider]  Biotin 10 MG TABS Take by mouth daily.    [provider]  Cholecalciferol (VITAMIN D) 2000 UNITS CAPS Take 1 capsule by mouth daily.     [provider]  Coenzyme Q10 (CO Q10) 200 MG CAPS Take 1 capsule by mouth 2 (two) times daily.    [provider]  Lancets [facility] ULTRASOFT) lancets Check sugar once daily DX 73.9 needs for one touch ultra 2 lancets 10/25/15   Jerrol Banana., MD  lubiprostone (AMITIZA) 8 MCG capsule Take 8 mcg by mouth 2 (two) times daily with a meal.    [provider]  metoprolol succinate (TOPROL-XL) 25 MG 24 hr tablet TAKE 1 TABLET BY MOUTH EVERY DAY Patient not taking: Reported on 10/17/2017 03/13/17   Mar Daring, PA-C  ONE TOUCH ULTRA TEST test strip CHECK BLOOD SUGAR ONCE DAILY AS DIRECTED 11/13/16   Jerrol Banana., MD  pilocarpine (SALAGEN) 5 MG tablet Take 1 tablet (5 mg total) by mouth 2 (two) times daily. 06/19/17   Jerrol Banana., MD  polyethylene glycol [facility]) packet Take 17 g by mouth 2 (two) times daily.    [provider]  Probiotic Product (PROBIOTIC-10 PO) Take by mouth daily.    [provider]  vitamin B-12 (CYANOCOBALAMIN) 100 MCG tablet Take 100 mcg by mouth daily.    [provider]    Allergies as of 09/26/2017 - Review Complete 09/24/2017  Allergen Reaction Noted  . Amoxicillin Other (See Comments) 07/31/2014  . Baby oil  07/31/2014  . Blue dyes (parenteral)  07/31/2014  . Cortisone  07/31/2014  . Diphenhydramine Other (See Comments) 07/31/2014  . Doxycycline Other (See Comments) 07/31/2014  . Esomeprazole Other (See Comments) 07/31/2014  . Iodinated diagnostic agents Other (See Comments) 07/31/2014  . Metronidazole  06/08/2016  . Nortriptyline hcl  07/31/2014  . Other Other (See Comments) 07/31/2014  .  Sulfamethoxazole-trimethoprim Other (See Comments) 06/02/2015  . Tetracycline Other (See Comments) 08/03/2014  . Levofloxacin Rash 07/31/2014  . Prednisone Rash, Swelling, and Other (See Comments) 10/21/2013  . Sulfa antibiotics Rash 07/31/2014    Family History  Problem Relation Age of Onset  . Congestive Heart Failure Mother   . Parkinson's disease Mother   . Diabetes Father   . Scleroderma Sister   . Cancer Brother     Social History   Socioeconomic History  . Marital status: Married    Spouse name: Not on file  . Number of children: 1  . Years of education: Not on file  . Highest education level: Some college, no degree  Occupational History  . Occupation: retired  Scientific laboratory technician  . Financial resource strain: Not hard at all  . Food insecurity:    Worry: Never true    Inability: Never true  . Transportation needs:    Medical: No    Non-medical: No  Tobacco Use  . Smoking status: Never Smoker  . Smokeless tobacco: Never Used  Substance and Sexual Activity  . Alcohol use: No    Alcohol/week: 0.0 standard drinks  . Drug use: No  . Sexual activity: Not on file  Lifestyle  . Physical activity:    Days per week: Not on file    Minutes per session: Not on file  . Stress: Only a little  Relationships  . Social connections:    Talks on phone: Not on file    Gets together: Not on file    Attends religious service: Not on file    Active member of club or organization: Not on file    Attends meetings of clubs or organizations: Not on file    Relationship status: Not on file  . Intimate partner violence:    Fear of current or ex partner: Not on file    Emotionally abused: Not on file    Physically abused: Not on file    Forced sexual activity: Not on file  Other Topics Concern  . Not on file  Social History Narrative  . Not on file    Review of Systems: See HPI, otherwise negative ROS  Physical Exam: BP 132/67   Pulse 64   Temp (!) 96.4 F (35.8 C)    Resp 18   Ht 5' 6"  (1.676 m)   Wt 79.4 kg   SpO2 99%   BMI 28.25 kg/m  General:   Alert,  pleasant and cooperative in NAD Head:  Normocephalic and atraumatic. Neck:  Supple; no masses or thyromegaly. Lungs:  Clear throughout to auscultation, normal respiratory effort.    Heart:  +S1, +S2, Regular rate and rhythm, No edema. Abdomen:  Soft, nontender and nondistended. Normal bowel sounds, without guarding, and without rebound.   Neurologic:  Alert and  oriented x4;  grossly  normal neurologically.  Impression/Plan: Virginia Phillips is here for an EGD for Bloating, abnormal upper GI study, rule out Gastric outlet obstruction.  Risks, benefits, limitations, and alternatives regarding the procedure have been reviewed with the patient.  Questions have been answered.  All parties agreeable.   [name], MD  10/17/2017, 11:34 AM

## 2017-10-17 NOTE — Anesthesia Post-op Follow-up Note (Signed)
Anesthesia QCDR form completed.        

## 2017-10-17 NOTE — Anesthesia Postprocedure Evaluation (Signed)
Anesthesia Post Note  Patient: Virginia Phillips  Procedure(s) Performed: ESOPHAGOGASTRODUODENOSCOPY (EGD) WITH PROPOFOL (N/A )  Patient location during evaluation: PACU Anesthesia Type: General Level of consciousness: awake and alert Pain management: pain level controlled Vital Signs Assessment: post-procedure vital signs reviewed and stable Respiratory status: spontaneous breathing, nonlabored ventilation and respiratory function stable Cardiovascular status: blood pressure returned to baseline and stable Postop Assessment: no apparent nausea or vomiting Anesthetic complications: no     Last Vitals:  Vitals:   10/17/17 1222 10/17/17 1232  BP: 97/66 122/72  Pulse: 69 66  Resp: 19 15  Temp:    SpO2: 94% 96%    Last Pain:  Vitals:   10/17/17 1232  PainSc: 0-No pain                 Durenda Hurt

## 2017-10-17 NOTE — Op Note (Signed)
[facility] Patient Name: Virginia Phillips Procedure Date: 10/17/2017 11:48 AM MRN: [id number] Account #: [id number] Date of Birth: [birth year] Admit Type: Outpatient Age: 72 Room: [facility] ENDO ROOM 3 Gender: Female Note Status: Finalized Procedure:            Upper GI endoscopy Indications:          Abnormal UGI series, Abdominal bloating Providers:            Varnita B. Bonna Gains MD, MD Referring MD:         Honor Loh. Ward (Referring MD) Medicines:            Monitored Anesthesia Care Complications:        No immediate complications. Procedure:            Pre-Anesthesia Assessment:                       - Prior to the procedure, a History and Physical was                        performed, and patient medications, allergies and                        sensitivities were reviewed. The patient's tolerance of                        previous anesthesia was reviewed.                       - The risks and benefits of the procedure and the                        sedation options and risks were discussed with the                        patient. All questions were answered and informed                        consent was obtained.                       - Patient identification and proposed procedure were                        verified prior to the procedure by the physician, the                        nurse, the anesthesiologist, the anesthetist and the                        technician. The procedure was verified in the procedure                        room.                       - ASA Grade Assessment: III - A patient with severe                        systemic disease.  After obtaining informed consent, the endoscope was                        passed under direct vision. Throughout the procedure,                        the patient's blood pressure, pulse, and oxygen                        saturations were monitored continuously. The  Endoscope                        was introduced through the mouth, and advanced to the                        second part of duodenum. The upper GI endoscopy was                        accomplished with ease. The patient tolerated the                        procedure well. Findings:      The examined esophagus was normal.      Patchy mildly erythematous mucosa without bleeding was found in the       gastric antrum. Biopsies were taken with a cold forceps for histology.       Biopsies were obtained at the incisura and in the gastric antrum with       cold forceps for histology.      A large amount of food (residue) was found in the gastric body. The area       underneath the food could not be examined.      There is no endoscopic evidence of gastric outlet obstruction in the       entire examined stomach.      The duodenal bulb, second portion of the duodenum and examined duodenum       were normal. Impression:           - Normal esophagus.                       - Erythematous mucosa in the antrum. Biopsied.                       - A large amount of food (residue) in the stomach. No                        aspiration events, no vomiting occured throughout the                        procedure.                       - Normal duodenal bulb, second portion of the duodenum                        and examined duodenum.                       - Biopsies were obtained at the incisura and in the  gastric antrum. Recommendation:       - Await pathology results.                       - Patient should eat small frequent meals.                       - Obtain Gastric emptying study                       - Discharge patient to home (with escort).                       - Advance diet as tolerated.                       - Continue present medications.                       - Patient has a contact number available for                        emergencies. The signs and symptoms of  potential                        delayed complications were discussed with the patient.                        Return to normal activities tomorrow. Written discharge                        instructions were provided to the patient.                       - Discharge patient to home (with escort).                       - The findings and recommendations were discussed with                        the patient.                       - The findings and recommendations were discussed with                        the patient's family. Procedure Code(s):    --- Professional ---                       [phone], Esophagogastroduodenoscopy, flexible, transoral;                        with biopsy, single or multiple Diagnosis Code(s):    --- Professional ---                       K31.89, Other diseases of stomach and duodenum                       R14.0, Abdominal distension (gaseous)                       R93.3, Abnormal findings on diagnostic imaging of other  parts of digestive tract CPT copyright 2017 American Medical Association. All rights reserved. The codes documented in this report are preliminary and upon coder review may  be revised to meet current compliance requirements.  [name], MD Margretta Sidle B. Bonna Gains MD, MD 10/17/2017 12:18:27 PM This report has been signed electronically. Number of Addenda: 0 Note Initiated On: 10/17/2017 11:48 AM Estimated Blood Loss: Estimated blood loss: none.      [facility]

## 2017-10-17 NOTE — Anesthesia Procedure Notes (Signed)
Procedure Name: MAC Date/Time: 10/17/2017 12:00 PM Performed by: Rudean Hitt, CRNA Pre-anesthesia Checklist: Patient identified, Emergency Drugs available, Suction available, Patient being monitored and Timeout performed Patient Re-evaluated:Patient Re-evaluated prior to induction Oxygen Delivery Method: Nasal cannula Induction Type: IV induction

## 2017-10-17 NOTE — Anesthesia Preprocedure Evaluation (Addendum)
Anesthesia Evaluation  Patient identified by MRN, date of birth, ID band Patient awake    Reviewed: Allergy & Precautions, H&P , NPO status , Patient's Chart, lab work & pertinent test results  Airway Mallampati: II  TM Distance: >3 FB Neck ROM: full    Dental  (+) Missing   Pulmonary neg pulmonary ROS, sleep apnea ,           Cardiovascular hypertension, + CAD  negative cardio ROS       Neuro/Psych  Headaches, PSYCHIATRIC DISORDERS Dementia CVA negative neurological ROS  negative psych ROS   GI/Hepatic negative GI ROS, Neg liver ROS, hiatal hernia, GERD  ,  Endo/Other  negative endocrine ROS  Renal/GU negative Renal ROS  negative genitourinary   Musculoskeletal   Abdominal   Peds  Hematology negative hematology ROS (+)   Anesthesia Other Findings Past Medical History: No date: Coronary artery disease No date: GERD (gastroesophageal reflux disease) No date: Hypertension No date: Pre-diabetes  Past Surgical History: 2007: ANKLE FRACTURE SURGERY 05/10/2017: COLONOSCOPY WITH PROPOFOL; N/A     Comment:  Procedure: COLONOSCOPY WITH PROPOFOL;  Surgeon:               [name], MD;  Location: [facility] ENDOSCOPY;                Service: Endoscopy;  Laterality: N/A; No date: GANGLION CYST EXCISION; Right     Comment:  wrist 2008: KNEE ARTHROSCOPY W/ ACL RECONSTRUCTION; Right 08/05/2007: KNEE SURGERY No date: LASIK 0511: NISSEN FUNDOPLICATION  BMI    Body Mass Index:  28.25 kg/m      Reproductive/Obstetrics negative OB ROS                           Anesthesia Physical Anesthesia Plan  ASA: III  Anesthesia Plan: General   Post-op Pain Management:    Induction:   PONV Risk Score and Plan: Propofol infusion  Airway Management Planned:   Additional Equipment:   Intra-op Plan:   Post-operative Plan:   Informed Consent: I have reviewed the patients History and Physical,  chart, labs and discussed the procedure including the risks, benefits and alternatives for the proposed anesthesia with the patient or authorized representative who has indicated his/her understanding and acceptance.   Dental Advisory Given  Plan Discussed with: Anesthesiologist, CRNA and Surgeon  Anesthesia Plan Comments:        Anesthesia Quick Evaluation

## 2017-10-18 ENCOUNTER — Encounter: Payer: Self-pay | Admitting: Gastroenterology

## 2017-10-21 LAB — SURGICAL PATHOLOGY

## 2017-10-25 ENCOUNTER — Ambulatory Visit (INDEPENDENT_AMBULATORY_CARE_PROVIDER_SITE_OTHER): Payer: Medicare Other | Admitting: Gastroenterology

## 2017-10-25 ENCOUNTER — Encounter: Payer: Self-pay | Admitting: Gastroenterology

## 2017-10-25 VITALS — BP 138/73 | HR 80 | Wt 175.8 lb

## 2017-10-25 DIAGNOSIS — R933 Abnormal findings on diagnostic imaging of other parts of digestive tract: Secondary | ICD-10-CM

## 2017-10-25 NOTE — Progress Notes (Signed)
[name], MD [address]  [place], [place] [address]  Main: [phone]  Fax: [phone]   Primary Care Physician: Ward, Honor Loh, MD  Primary Gastroenterologist:  Dr. [name]  Chief complaint: Abdominal bloating  HPI: Virginia Phillips is a 72 y.o. female here for follow-up of abdominal bloating.  Patient's upper GI study showed considerable retained food particles within the stomach despite a 12-hour fast, and relatively tight esophageal wrap from her previous hiatal hernia surgery.  She since underwent EGD on October 17, 2017, with gastric erythema seen and biopsies obtained.  Large amount of fluid was present in the gastric body.  No evidence of gastric outlet obstruction.  Normal duodenum and esophagus.  Biopsies did not show any H. pylori or intestinal metaplasia.  Antral transitional type mucosa with congestion reported. No diarrhea or weight loss. No melena or hematochezia.  Patient takes multiple over-the-counter multivitamins and supplements.  Screening colonoscopy up-to-date, March 2019, 3 mm transverse colon polyp removed and showed tubular adenoma.  Polyp surveillance due 5 years from March 2019.  Current Outpatient Medications  Medication Sig Dispense Refill  . aspirin 81 MG tablet Take 1 tablet by mouth daily.    Marland Kitchen b complex vitamins tablet Take 1 tablet by mouth daily.    Marland Kitchen Bioflavonoid Products (VITAMIN C PLUS) 500 MG TABS Take 1 tablet by mouth daily.     . Biotin 10 MG TABS Take by mouth daily.    . Chlorpheniramine-PSE-Ibuprofen 2-30-200 MG TABS Take 1 tablet by mouth as needed.     . Cholecalciferol (VITAMIN D) 2000 UNITS CAPS Take 1 capsule by mouth daily.     . Coenzyme Q10 (CO Q10) 200 MG CAPS Take 1 capsule by mouth 2 (two) times daily.    . Lancets (ONETOUCH ULTRASOFT) lancets Check sugar once daily DX 73.9 needs for one touch ultra 2 lancets 50 each 12  . lubiprostone (AMITIZA) 8 MCG capsule Take 8 mcg by mouth 2 (two)  times daily with a meal.    . metoprolol succinate (TOPROL-XL) 25 MG 24 hr tablet TAKE 1 TABLET BY MOUTH EVERY DAY (Patient not taking: Reported on 10/17/2017) 90 tablet 1  . ONE TOUCH ULTRA TEST test strip CHECK BLOOD SUGAR ONCE DAILY AS DIRECTED 100 each 3  . Petasin (PETADOLEX PO) Take by mouth daily.    . pilocarpine (SALAGEN) 5 MG tablet Take 1 tablet (5 mg total) by mouth 2 (two) times daily. 90 tablet 5  . polyethylene glycol (MIRALAX / GLYCOLAX) packet Take 17 g by mouth 2 (two) times daily.    . pravastatin (PRAVACHOL) 10 MG tablet TAKE 1 TABLET(10 MG) BY MOUTH AT BEDTIME 90 tablet 3  . Probiotic Product (PROBIOTIC-10 PO) Take by mouth daily.    . riboflavin (VITAMIN B-2) 100 MG TABS tablet Take 1 tablet by mouth daily.    Marland Kitchen triamcinolone cream (KENALOG) 0.1 % Apply topically as needed. 1 Cream apply to affected area as needed 45 g 1  . valsartan (DIOVAN) 80 MG tablet TK 1 T PO ONCE A DAY  3  . venlafaxine (EFFEXOR) 75 MG tablet TAKE 1 TABLET(75 MG) BY MOUTH DAILY 90 tablet 3  . vitamin B-12 (CYANOCOBALAMIN) 100 MCG tablet Take 100 mcg by mouth daily.     No current facility-administered medications for this visit.     Allergies as of 10/25/2017 - Review Complete 10/17/2017  Allergen Reaction Noted  . Amoxicillin Other (See Comments) 07/31/2014  . Baby  oil  07/31/2014  . Blue dyes (parenteral)  07/31/2014  . Cortisone  07/31/2014  . Diphenhydramine Other (See Comments) 07/31/2014  . Doxycycline Other (See Comments) 07/31/2014  . Esomeprazole Other (See Comments) 07/31/2014  . Iodinated diagnostic agents Other (See Comments) 07/31/2014  . Metronidazole  06/08/2016  . Nortriptyline hcl  07/31/2014  . Other Other (See Comments) 07/31/2014  . Sulfamethoxazole-trimethoprim Other (See Comments) 06/02/2015  . Tetracycline Other (See Comments) 08/03/2014  . Levofloxacin Rash 07/31/2014  . Prednisone Rash, Swelling, and Other (See Comments) 10/21/2013  . Sulfa antibiotics Rash  07/31/2014    ROS:  General: Negative for anorexia, weight loss, fever, chills, fatigue, weakness. ENT: Negative for hoarseness, difficulty swallowing , nasal congestion. CV: Negative for chest pain, angina, palpitations, dyspnea on exertion, peripheral edema.  Respiratory: Negative for dyspnea at rest, dyspnea on exertion, cough, sputum, wheezing.  GI: See history of present illness. GU:  Negative for dysuria, hematuria, urinary incontinence, urinary frequency, nocturnal urination.  Endo: Negative for unusual weight change.    Physical Examination: Vitals:   10/25/17 1342  BP: 138/73  Pulse: 80  Weight: 175 lb 12.8 oz (79.7 kg)    General: Well-nourished, well-developed in no acute distress.  Eyes: No icterus. Conjunctivae pink. Mouth: Oropharyngeal mucosa moist and pink , no lesions erythema or exudate. Neck: Supple, Trachea midline Abdomen: Bowel sounds are normal, nontender, nondistended, no hepatosplenomegaly or masses, no abdominal bruits or hernia , no rebound or guarding.   Extremities: No lower extremity edema. No clubbing or deformities. Neuro: Alert and oriented x 3.  Grossly intact. Skin: Warm and dry, no jaundice.   Psych: Alert and cooperative, normal mood and affect.   Labs: CMP     Component Value Date/Time   NA 141 05/18/2017 1132   NA 138 06/17/2013 2031   K 4.5 05/18/2017 1132   K 4.1 06/17/2013 2031   CL 103 05/18/2017 1132   CL 105 06/17/2013 2031   CO2 25 05/18/2017 1132   CO2 24 06/17/2013 2031   GLUCOSE 101 (H) 05/18/2017 1132   GLUCOSE 105 (H) 12/14/2016 1339   GLUCOSE 151 (H) 06/17/2013 2031   BUN 13 05/18/2017 1132   BUN 19 (H) 06/17/2013 2031   CREATININE 0.76 05/18/2017 1132   CREATININE 0.79 12/14/2016 1339   CALCIUM 9.5 05/18/2017 1132   CALCIUM 10.8 (H) 06/17/2013 2031   PROT 6.5 05/18/2017 1132   PROT 9.0 (H) 06/17/2013 2031   ALBUMIN 4.2 05/18/2017 1132   ALBUMIN 4.9 06/17/2013 2031   AST 26 05/18/2017 1132   AST 39 (H)  06/17/2013 2031   ALT 26 05/18/2017 1132   ALT 32 06/17/2013 2031   ALKPHOS 58 05/18/2017 1132   ALKPHOS 78 06/17/2013 2031   BILITOT 0.3 05/18/2017 1132   BILITOT 0.3 06/17/2013 2031   GFRNONAA 79 05/18/2017 1132   GFRNONAA 75 12/14/2016 1339   GFRAA 91 05/18/2017 1132   GFRAA 87 12/14/2016 1339   Lab Results  Component Value Date   WBC 6.4 05/18/2017   HGB 14.0 05/18/2017   HCT 41.8 05/18/2017   MCV 95 05/18/2017   PLT 259 05/18/2017    Imaging Studies: No results found.  Assessment and Plan:   Virginia Phillips is a 72 y.o. y/o female here for follow-up of abdominal bloating, with large amount of fluid seen in the stomach during EGD, and also on upper GI study  We will obtain an gastric emptying study to evaluate for gastroparesis Patient agreeable with the plan  Small frequent meals encouraged Patient does not have any nausea or vomiting to indicate antibiotics No evidence of gastric outlet obstruction on EGD  Patient asked to cut down on her multiple over-the-counter supplements that she takes, as she has multiple bottles of these.  She verbalized understanding Follow-up with PCP closely   Dr [name]

## 2017-10-25 NOTE — Patient Instructions (Signed)
Gastric Emptying Study at [facility] at 11:00 am.  Arrival time: 10:30 am Hold GI meds 4-6 hr prior to study and nothing to eat or drink 6 hrs prior to procedure. Check in at [facility].

## 2017-11-14 ENCOUNTER — Ambulatory Visit
Admission: RE | Admit: 2017-11-14 | Discharge: 2017-11-14 | Disposition: A | Discharge status: Home / Self Care | Payer: Medicare Other | Source: Ambulatory Visit | Attending: Gastroenterology | Admitting: Gastroenterology

## 2017-11-14 DIAGNOSIS — R933 Abnormal findings on diagnostic imaging of other parts of digestive tract: Secondary | ICD-10-CM | POA: Diagnosis not present

## 2017-11-14 DIAGNOSIS — T182XXA Foreign body in stomach, initial encounter: Secondary | ICD-10-CM | POA: Diagnosis not present

## 2017-11-14 MED ORDER — TECHNETIUM TC 99M SULFUR COLLOID
2.2800 | Freq: Once | INTRAVENOUS | Status: AC | PRN
  Administered 2017-11-14: 2.28 via INTRAVENOUS

## 2017-11-16 ENCOUNTER — Telehealth: Payer: Self-pay

## 2017-11-16 NOTE — Telephone Encounter (Signed)
Pt notified of gastric emptying study results. Pt stated she still having some bloating and would like to keep follow up appt to discuss.

## 2017-11-16 NOTE — Telephone Encounter (Signed)
-----   Message from [name], MD sent at 11/15/2017  9:22 AM EDT ----- Virginia Phillips please let patient know, her gastric emptying study was normal. If she would like she can postpone her clinic follow up with [place] to 6 months or just call just call for clinic follow up as needed.

## 2017-11-21 DIAGNOSIS — Z23 Encounter for immunization: Secondary | ICD-10-CM | POA: Diagnosis not present

## 2017-11-22 ENCOUNTER — Ambulatory Visit (INDEPENDENT_AMBULATORY_CARE_PROVIDER_SITE_OTHER): Payer: Medicare Other | Admitting: Gastroenterology

## 2017-11-22 VITALS — BP 128/78 | HR 80 | Ht 66.0 in | Wt 177.4 lb

## 2017-11-22 DIAGNOSIS — R1013 Epigastric pain: Secondary | ICD-10-CM

## 2017-11-23 ENCOUNTER — Telehealth: Payer: Self-pay | Admitting: Gastroenterology

## 2017-11-23 NOTE — Progress Notes (Signed)
[name], MD [address]  [place]  [place], [place] [address]  Main: [phone]  Fax: [phone]   Primary Care Physician: Ward, Honor Loh, MD  Primary Gastroenterologist:  Dr. [name]  Chief Complaint  Patient presents with  . Follow-up    results, and ongoing issues    HPI: Virginia Phillips is a 72 y.o. female here for follow-up of dyspepsia.  No nausea or vomiting.  Her main complaint continues to be that she has to change her bra size depending on the day and bloating.  No abdominal pain.  No weight loss.  Gastric emptying study was normal after EGD. EGD in October 17, 2017 with gastric erythema seen and large amount of food present in the gastric body with no evidence of gastric outlet obstruction.  Normal duodenum and esophagus.  Biopsies did not show any H. pylori or intestinal metaplasia.  Antral transitional type because of the congestion reported.  Screening colonoscopy up-to-date, March 2019, 3 mm transverse colon, polyp removed and showed tubular adenoma.  Polyp surveillance due 5 years from March 2019.  Current Outpatient Medications  Medication Sig Dispense Refill  . aspirin 81 MG tablet Take 1 tablet by mouth daily.    Marland Kitchen aspirin-acetaminophen-caffeine (EXCEDRIN MIGRAINE) 250-250-65 MG tablet Take by mouth.    Marland Kitchen b complex vitamins tablet Take 1 tablet by mouth daily.    Marland Kitchen Bioflavonoid Products (VITAMIN C PLUS) 500 MG TABS Take 1 tablet by mouth daily.     . Biotin 10 MG TABS Take 5,000 mg by mouth daily.     . Cholecalciferol (VITAMIN D) 2000 UNITS CAPS Take 1 capsule by mouth daily.     . Coenzyme Q10 (CO Q10) 200 MG CAPS Take 1 capsule by mouth 2 (two) times daily.    Marland Kitchen ibuprofen (ADVIL,MOTRIN) 200 MG tablet Take 200 mg by mouth.    . Lancets (ONETOUCH ULTRASOFT) lancets Check sugar once daily DX 73.9 needs for one touch ultra 2 lancets 50 each 12  . lanolin-mineral oil (BABY OIL) OIL Apply topically as needed.    . Misc Natural  Products (HERBAL ENERGY COMPLEX PO) Take by mouth.    . neomycin-bacitracin-polymyxin (NEOSPORIN) ointment Apply 1 application topically every 12 (twelve) hours.    . Nutritional Supplements (OSTEO ADVANCE PO) Take by mouth daily.    . ONE TOUCH ULTRA TEST test strip CHECK BLOOD SUGAR ONCE DAILY AS DIRECTED 100 each 3  . Petasin (PETADOLEX PO) Take by mouth daily.    . polyethylene glycol (MIRALAX / GLYCOLAX) packet Take 17 g by mouth 2 (two) times daily.    . pravastatin (PRAVACHOL) 10 MG tablet TAKE 1 TABLET(10 MG) BY MOUTH AT BEDTIME 90 tablet 3  . Probiotic Product (PROBIOTIC-10 PO) Take 50 mg by mouth daily.     Marland Kitchen pyridOXINE (VITAMIN B-6) 100 MG tablet Take 100 mg by mouth daily.    . RESTASIS 0.05 % ophthalmic emulsion INT 1 GTT IN OU BID  6  . riboflavin (VITAMIN B-2) 100 MG TABS tablet Take 1 tablet by mouth daily.    . valsartan (DIOVAN) 80 MG tablet TK 1 T PO ONCE A DAY  3  . venlafaxine (EFFEXOR) 75 MG tablet TAKE 1 TABLET(75 MG) BY MOUTH DAILY 90 tablet 3  . vitamin B-12 (CYANOCOBALAMIN) 100 MCG tablet Take 100 mcg by mouth daily.     No current facility-administered medications for this visit.     Allergies as of 11/22/2017 - Review Complete  11/22/2017  Allergen Reaction Noted  . Amoxicillin Other (See Comments) 07/31/2014  . Baby oil  07/31/2014  . Blue dyes (parenteral)  07/31/2014  . Cortisone  07/31/2014  . Diphenhydramine Other (See Comments) 07/31/2014  . Doxycycline Other (See Comments) 07/31/2014  . Esomeprazole Other (See Comments) 07/31/2014  . Green dye Other (See Comments) 08/20/2013  . Iodinated diagnostic agents Other (See Comments) 07/31/2014  . Metronidazole  06/08/2016  . Mineral oil  07/31/2014  . Nortriptyline hcl  07/31/2014  . Other Other (See Comments) 07/31/2014  . Sulfamethoxazole-trimethoprim Other (See Comments) 06/02/2015  . Tetracycline Other (See Comments) 08/03/2014  . Levofloxacin Rash 07/31/2014  . Prednisone Rash, Swelling, and Other  (See Comments) 10/21/2013  . Sulfa antibiotics Rash 07/31/2014    ROS:  General: Negative for anorexia, weight loss, fever, chills, fatigue, weakness. ENT: Negative for hoarseness, difficulty swallowing , nasal congestion. CV: Negative for chest pain, angina, palpitations, dyspnea on exertion, peripheral edema.  Respiratory: Negative for dyspnea at rest, dyspnea on exertion, cough, sputum, wheezing.  GI: See history of present illness. GU:  Negative for dysuria, hematuria, urinary incontinence, urinary frequency, nocturnal urination.  Endo: Negative for unusual weight change.    Physical Examination:   BP 128/78   Pulse 80   Ht 5' 6"  (1.676 m)   Wt 177 lb 6.4 oz (80.5 kg)   BMI 28.63 kg/m   General: Well-nourished, well-developed in no acute distress.  Eyes: No icterus. Conjunctivae pink. Mouth: Oropharyngeal mucosa moist and pink , no lesions erythema or exudate. Neck: Supple, Trachea midline Abdomen: Bowel sounds are normal, nontender, nondistended, no hepatosplenomegaly or masses, no abdominal bruits or hernia , no rebound or guarding.   Extremities: No lower extremity edema. No clubbing or deformities. Neuro: Alert and oriented x 3.  Grossly intact. Skin: Warm and dry, no jaundice.   Psych: Alert and cooperative, normal mood and affect.   Labs: CMP     Component Value Date/Time   NA 141 05/18/2017 1132   NA 138 06/17/2013 2031   K 4.5 05/18/2017 1132   K 4.1 06/17/2013 2031   CL 103 05/18/2017 1132   CL 105 06/17/2013 2031   CO2 25 05/18/2017 1132   CO2 24 06/17/2013 2031   GLUCOSE 101 (H) 05/18/2017 1132   GLUCOSE 105 (H) 12/14/2016 1339   GLUCOSE 151 (H) 06/17/2013 2031   BUN 13 05/18/2017 1132   BUN 19 (H) 06/17/2013 2031   CREATININE 0.76 05/18/2017 1132   CREATININE 0.79 12/14/2016 1339   CALCIUM 9.5 05/18/2017 1132   CALCIUM 10.8 (H) 06/17/2013 2031   PROT 6.5 05/18/2017 1132   PROT 9.0 (H) 06/17/2013 2031   ALBUMIN 4.2 05/18/2017 1132   ALBUMIN 4.9  06/17/2013 2031   AST 26 05/18/2017 1132   AST 39 (H) 06/17/2013 2031   ALT 26 05/18/2017 1132   ALT 32 06/17/2013 2031   ALKPHOS 58 05/18/2017 1132   ALKPHOS 78 06/17/2013 2031   BILITOT 0.3 05/18/2017 1132   BILITOT 0.3 06/17/2013 2031   GFRNONAA 79 05/18/2017 1132   GFRNONAA 75 12/14/2016 1339   GFRAA 91 05/18/2017 1132   GFRAA 87 12/14/2016 1339   Lab Results  Component Value Date   WBC 6.4 05/18/2017   HGB 14.0 05/18/2017   HCT 41.8 05/18/2017   MCV 95 05/18/2017   PLT 259 05/18/2017    Imaging Studies: Nm Gastric Emptying  Result Date: 11/15/2017 CLINICAL DATA:  Prediabetes. Retained food in the stomach on upper GI exam.  EXAM: NUCLEAR MEDICINE GASTRIC EMPTYING SCAN TECHNIQUE: After oral ingestion of radiolabeled meal, sequential abdominal images were obtained for 4 hours. Percentage of activity emptying the stomach was calculated at 1 hour, 2 hour, 3 hour, and 4 hours. RADIOPHARMACEUTICALS:  2.3 mCi [id number] colloid in standardized meal COMPARISON:  Upper GI exam of 08/29/2017 FINDINGS: Expected location of the stomach in the left upper quadrant. Ingested meal empties the stomach gradually over the course of the study. 28% emptied at 1 hr ( normal >= 10%) 91% emptied at 2 hr ( normal >= 40%) 98% emptied at 3 hr ( normal >= 70%) 4 hour measurement deferred due to high emptying at 3 hours. IMPRESSION: Normal gastric emptying study. Electronically Signed   By: WVan ClinesM.D.   On: 11/15/2017 08:18    Assessment and Plan:   Virginia LOZADAis a 72y.o. y/o female here for follow-up of dyspepsia  The food that was present in her stomach during the EGD were a lot of seeds Patient does eat a lot of seeds and may be eating too much at one time Gastric emptying study was normal and was is reassuring She has not lost any weight or have any alarm symptoms Her symptoms are functional at this point  She is also on multiple supplements and vitamins and she was asked to  decrease this on last visit and this is encouraged again  Trial of PPI as indicated for dyspepsia as per guidelines recommendations.  See up-to-date Patient is willing to try this (Risks of PPI use were discussed with patient including bone loss, C. Diff diarrhea, pneumonia, infections, CKD, electrolyte abnormalities.  If clinically possible based on symptoms, goal would be to maintain patient on the lowest dose possible, or discontinue the medication with institution of acid reflux lifestyle modifications over time. Pt. Verbalizes understanding and chooses to continue the medication.)  She has an allergy listed to esomeprazole, our clinic will contact her to see what the allergy was and if it is not severe, and patient is willing to try another PPI like Protonix or omeprazole, will prescribe once daily dosing  In addition low FODMAP diet might also help her and patient was educated about this and handout given for the same  She is using MiraLAX and having 1-2 bowel movements a day without straining.  Dr VVonda Antigua

## 2017-11-23 NOTE — Telephone Encounter (Signed)
Pt left vm she was supposed to have rx at pharmacy

## 2017-11-28 ENCOUNTER — Other Ambulatory Visit: Payer: Self-pay

## 2017-11-28 ENCOUNTER — Telehealth: Payer: Self-pay | Admitting: Gastroenterology

## 2017-11-28 NOTE — Telephone Encounter (Signed)
Patient call for Virginia Phillips her niece takes Prilosec or generic omeprazole.Pharmacy [facility] [phone]).

## 2017-11-28 NOTE — Telephone Encounter (Signed)
Pt will check her records and call back regarding this allergy to Nexium.

## 2017-11-28 NOTE — Telephone Encounter (Signed)
Pt is calling to let Jackelyn Poling know she can not find records from 2007 priosect

## 2017-12-11 DIAGNOSIS — M222X1 Patellofemoral disorders, right knee: Secondary | ICD-10-CM | POA: Diagnosis not present

## 2017-12-11 DIAGNOSIS — M25561 Pain in right knee: Secondary | ICD-10-CM | POA: Diagnosis not present

## 2017-12-12 NOTE — Telephone Encounter (Signed)
Pt was sent a message via My Chart.

## 2018-03-11 DIAGNOSIS — G4733 Obstructive sleep apnea (adult) (pediatric): Secondary | ICD-10-CM | POA: Diagnosis not present

## 2018-03-11 DIAGNOSIS — I10 Essential (primary) hypertension: Secondary | ICD-10-CM | POA: Diagnosis not present

## 2018-03-11 DIAGNOSIS — M79671 Pain in right foot: Secondary | ICD-10-CM | POA: Diagnosis not present

## 2018-03-11 DIAGNOSIS — R0602 Shortness of breath: Secondary | ICD-10-CM | POA: Diagnosis not present

## 2018-03-11 DIAGNOSIS — E782 Mixed hyperlipidemia: Secondary | ICD-10-CM | POA: Diagnosis not present

## 2018-03-13 DIAGNOSIS — Z8673 Personal history of transient ischemic attack (TIA), and cerebral infarction without residual deficits: Secondary | ICD-10-CM | POA: Diagnosis not present

## 2018-03-13 DIAGNOSIS — N951 Menopausal and female climacteric states: Secondary | ICD-10-CM | POA: Diagnosis not present

## 2018-03-13 DIAGNOSIS — Z7689 Persons encountering health services in other specified circumstances: Secondary | ICD-10-CM | POA: Diagnosis not present

## 2018-03-25 DIAGNOSIS — R0602 Shortness of breath: Secondary | ICD-10-CM | POA: Diagnosis not present

## 2018-04-10 DIAGNOSIS — E782 Mixed hyperlipidemia: Secondary | ICD-10-CM | POA: Diagnosis not present

## 2018-04-10 DIAGNOSIS — I10 Essential (primary) hypertension: Secondary | ICD-10-CM | POA: Diagnosis not present

## 2018-04-10 DIAGNOSIS — G4733 Obstructive sleep apnea (adult) (pediatric): Secondary | ICD-10-CM | POA: Diagnosis not present

## 2018-04-10 DIAGNOSIS — I639 Cerebral infarction, unspecified: Secondary | ICD-10-CM | POA: Diagnosis not present

## 2018-04-23 DIAGNOSIS — M2041 Other hammer toe(s) (acquired), right foot: Secondary | ICD-10-CM | POA: Diagnosis not present

## 2018-04-24 ENCOUNTER — Other Ambulatory Visit: Payer: Self-pay | Admitting: Podiatry

## 2018-04-29 ENCOUNTER — Encounter: Payer: Self-pay | Admitting: Anesthesiology

## 2018-04-30 ENCOUNTER — Encounter
Admission: RE | Admit: 2018-04-30 | Discharge: 2018-04-30 | Disposition: A | Discharge status: Home / Self Care | Payer: Medicare Other | Source: Ambulatory Visit | Attending: Podiatry | Admitting: Podiatry

## 2018-04-30 ENCOUNTER — Other Ambulatory Visit: Payer: Self-pay

## 2018-04-30 DIAGNOSIS — Z01812 Encounter for preprocedural laboratory examination: Secondary | ICD-10-CM | POA: Insufficient documentation

## 2018-04-30 HISTORY — DX: Unspecified glaucoma: H40.9

## 2018-04-30 HISTORY — DX: Headache, unspecified: R51.9

## 2018-04-30 HISTORY — DX: Sjogren syndrome, unspecified: M35.00

## 2018-04-30 HISTORY — DX: Personal history of other infectious and parasitic diseases: Z86.19

## 2018-04-30 HISTORY — DX: Headache: R51

## 2018-04-30 HISTORY — DX: Other pulmonary embolism without acute cor pulmonale: I26.99

## 2018-04-30 HISTORY — DX: Cerebral infarction, unspecified: I63.9

## 2018-04-30 LAB — BASIC METABOLIC PANEL
Anion gap: 7 (ref 5–15)
BUN: 16 mg/dL (ref 8–23)
CO2: 26 mmol/L (ref 22–32)
Calcium: 9 mg/dL (ref 8.9–10.3)
Chloride: 103 mmol/L (ref 98–111)
Creatinine, Ser: 0.77 mg/dL (ref 0.44–1.00)
GFR calc Af Amer: >60 mL/min (ref >60)
GFR calc non Af Amer: >60 mL/min (ref >60)
Glucose, Bld: 118 mg/dL — ABNORMAL HIGH (ref 70–99)
Potassium: 4.6 mmol/L (ref 3.5–5.1)
Sodium: 136 mmol/L (ref 135–145)

## 2018-04-30 NOTE — Patient Instructions (Signed)
Your procedure is scheduled on: May 10, 2018 FRIDAY Report to Day Surgery on the 2nd floor of the [facility]. To find out your arrival time, please call [phone] between 1PM - 3PM on: May 09, 2018 THURSDAY  REMEMBER: Instructions that are not followed completely may result in serious medical risk, up to and including death; or upon the discretion of your surgeon and anesthesiologist your surgery may need to be rescheduled.  Do not eat food after midnight the night before surgery.  No gum chewing, lozengers or hard candies.  You may however, drink CLEAR liquids up to 2 hours before you are scheduled to arrive for your surgery. Do not drink anything within 2 hours of the start of your surgery.  Clear liquids include: - water  - apple juice without pulp - CLEAR gatorade - black coffee or tea (Do NOT add milk or creamers to the coffee or tea) Do NOT drink anything that is not on this list.  Type 1 and Type 2 diabetics should only drink water.    No Alcohol for 24 hours before or after surgery.  No Smoking including e-cigarettes for 24 hours prior to surgery.  No chewable tobacco products for at least 6 hours prior to surgery.  No nicotine patches on the day of surgery.  On the morning of surgery brush your teeth with toothpaste and water, you may rinse your mouth with mouthwash if you wish. Do not swallow any toothpaste or mouthwash.  Notify your doctor if there is any change in your medical condition (cold, fever, infection).  Do not wear jewelry, make-up, hairpins, clips or nail polish.  Do not wear lotions, powders, or perfumes.   Do not shave 48 hours prior to surgery.   Contacts and dentures may not be worn into surgery.  Do not bring valuables to the hospital, including drivers license, insurance or credit cards.  [facility] is not responsible for any belongings or valuables.   TAKE THESE MEDICATIONS THE MORNING OF SURGERY: NONE  USE DIAL SOAP  Follow  recommendations from Cardiologist, Pulmonologist or PCP regarding stopping Aspirin, Coumadin, Plavix, Eliquis, Pradaxa, or Pletal.  Stop Anti-inflammatories (NSAIDS) such as Advil, Aleve, Ibuprofen, Motrin, Naproxen, Naprosyn and Aspirin based products such as Excedrin, Goodys Powder, BC Powder. FOR 1 WEEK BEFORE SURGERY  (May take Tylenol or Acetaminophen if needed.)  Stop ANY OVER THE COUNTER supplements ONE WEEK BEFORE SURGERY until after surgery. (May continue multivitamin.)  Wear comfortable clothing (specific to your surgery type) to the hospital.  Plan for stool softeners for home use.  If you are being admitted to the hospital overnight, leave your suitcase in the car. After surgery it may be brought to your room.  If you are being discharged the day of surgery, you will not be allowed to drive home. You will need a responsible adult to drive you home and stay with you that night.   If you are taking public transportation, you will need to have a responsible adult with you. Please confirm with your physician that it is acceptable to use public transportation.   Please call [phone] if you have any questions about these instructions.

## 2018-05-02 ENCOUNTER — Other Ambulatory Visit: Payer: Self-pay | Admitting: Obstetrics & Gynecology

## 2018-05-02 DIAGNOSIS — Z1231 Encounter for screening mammogram for malignant neoplasm of breast: Secondary | ICD-10-CM

## 2018-05-07 ENCOUNTER — Ambulatory Visit
Admission: RE | Admit: 2018-05-07 | Discharge: 2018-05-07 | Disposition: A | Discharge status: Home / Self Care | Payer: Medicare Other | Source: Ambulatory Visit | Attending: Obstetrics & Gynecology | Admitting: Obstetrics & Gynecology

## 2018-05-07 DIAGNOSIS — Z1231 Encounter for screening mammogram for malignant neoplasm of breast: Secondary | ICD-10-CM | POA: Diagnosis not present

## 2018-05-08 ENCOUNTER — Encounter: Payer: Self-pay | Admitting: Nurse Practitioner

## 2018-05-08 ENCOUNTER — Ambulatory Visit (INDEPENDENT_AMBULATORY_CARE_PROVIDER_SITE_OTHER): Payer: Medicare Other | Admitting: Nurse Practitioner

## 2018-05-08 ENCOUNTER — Other Ambulatory Visit: Payer: Self-pay

## 2018-05-08 VITALS — BP 124/69 | HR 85 | Temp 98.3°F | Resp 16 | Ht 66.0 in | Wt 185.0 lb

## 2018-05-08 DIAGNOSIS — J0140 Acute pansinusitis, unspecified: Secondary | ICD-10-CM

## 2018-05-08 DIAGNOSIS — I10 Essential (primary) hypertension: Secondary | ICD-10-CM

## 2018-05-08 DIAGNOSIS — B029 Zoster without complications: Secondary | ICD-10-CM | POA: Diagnosis not present

## 2018-05-08 MED ORDER — AZITHROMYCIN 250 MG PO TABS: ORAL_TABLET | ORAL | 0 refills | Status: DC

## 2018-05-08 MED ORDER — VALACYCLOVIR HCL 1 G PO TABS: 1000.0000 mg | ORAL_TABLET | Freq: Three times a day (TID) | ORAL | 2 refills | Status: DC

## 2018-05-08 NOTE — Progress Notes (Signed)
[facility] [place],  [address]  Internal MEDICINE  Office Visit Note  Patient Name: Virginia Phillips  [id number]  [id number]  Date of Service: 05/20/2018   Complaints/HPI Pt is here for establishment of PCP.  Chief Complaint  Patient presents with  . New Patient (Initial Visit)  . Hypertension  . Sinusitis   The patient is c/o frontal and maxillary sinus pain and pressure. She has used netty pot three times over the past several days. She was scheduled to have surgery on her feet this coming Friday, however, she has cancelled this due to the potential sinus infection. She does see GYN provider for well woman care. She had mammogram done yesterday. She is due to have routine, fasting labs done as well.    Current Medication: Outpatient Encounter Medications as of 05/08/2018  Medication Sig Note  . aspirin 81 MG tablet Take 162 mg by mouth daily at 2 PM.    . aspirin-sod bicarb-citric acid (ALKA-SELTZER) 325 MG TBEF tablet Take 650 mg by mouth every 6 (six) hours as needed (indigestion).   . Biotin (BIOTIN 5000) 5 MG CAPS Take 5,000 mg by mouth daily at 2 PM.   . BLACK CURRANT SEED OIL PO Take 1 capsule by mouth daily at 2 PM.   . Cholecalciferol (VITAMIN D) 2000 UNITS CAPS Take 2,000 Units by mouth daily at 2 PM.    . Coenzyme Q10 (CO Q10) 200 MG CAPS Take 200 mg by mouth daily at 2 PM.    . enoxaparin (LOVENOX) 40 MG/0.4ML injection Inject 40 mg into the skin daily. 04/25/2018: Post op  . ibuprofen (ADVIL,MOTRIN) 200 MG tablet Take 400 mg by mouth daily as needed for headache or moderate pain.    . Lancets (ONETOUCH ULTRASOFT) lancets Check sugar once daily DX 73.9 needs for one touch ultra 2 lancets   . lidocaine (LIDODERM) 5 % Place 1 patch onto the skin daily as needed (pain). Remove & Discard patch within 12 hours or as directed by MD   . Misc Natural Products (PETADOLEX 50) 50 MG CAPS Take 100 mg by mouth 2 (two) times daily at 10 AM and 5 PM.   .  ONE TOUCH ULTRA TEST test strip CHECK BLOOD SUGAR ONCE DAILY AS DIRECTED   . polyethylene glycol (MIRALAX / GLYCOLAX) packet Take 17 g by mouth daily.    . Polyvinyl Alcohol-Povidone (REFRESH OP) Place 1 drop into both eyes daily as needed (dry eyes).   . pravastatin (PRAVACHOL) 10 MG tablet TAKE 1 TABLET(10 MG) BY MOUTH AT BEDTIME (Patient taking differently: Take 10 mg by mouth at bedtime. )   . pyridOXINE (VITAMIN B-6) 100 MG tablet Take 100 mg by mouth daily at 2 PM.    . Riboflavin (B2) 100 MG TABS Take 100 mg by mouth 2 (two) times daily.   . SUPER B COMPLEX/C PO Take 1 tablet by mouth daily at 2 PM.   . Thiamine HCl (VITAMIN B-1) 250 MG tablet Take 250 mg by mouth daily at 2 PM.   . valACYclovir (VALTREX) 1000 MG tablet Take 1 tablet (1,000 mg total) by mouth 3 (three) times daily.   . valsartan (DIOVAN) 80 MG tablet Take 80 mg by mouth daily at 2 PM.    . venlafaxine XR (EFFEXOR-XR) 75 MG 24 hr capsule Take 75 mg by mouth daily at 2 PM.   . vitamin B-12 (CYANOCOBALAMIN) 1000 MCG tablet Take 1,000 mcg by mouth daily at 2  PM.   . vitamin C (ASCORBIC ACID) 500 MG tablet Take 500 mg by mouth daily at 2 PM.   . [DISCONTINUED] valACYclovir (VALTREX) 1000 MG tablet Take 1,000 mg by mouth 3 (three) times daily.   Marland Kitchen aspirin-acetaminophen-caffeine (EXCEDRIN MIGRAINE) 250-250-65 MG tablet Take 2 tablets by mouth daily as needed for headache or migraine.    Marland Kitchen azithromycin (ZITHROMAX) 250 MG tablet z-pack - take as directed for 5 days   . venlafaxine (EFFEXOR) 75 MG tablet TAKE 1 TABLET(75 MG) BY MOUTH DAILY (Patient not taking: Reported on 05/08/2018)    No facility-administered encounter medications on file as of 05/08/2018.     Surgical History: Past Surgical History:  Procedure Laterality Date  . ANKLE FRACTURE SURGERY Right 2007  . COLONOSCOPY WITH PROPOFOL N/A 05/10/2017   Procedure: COLONOSCOPY WITH PROPOFOL;  Surgeon: [name], MD;  Location: [facility];  Service: Endoscopy;   Laterality: N/A;  . ESOPHAGOGASTRODUODENOSCOPY (EGD) WITH PROPOFOL N/A 10/17/2017   Procedure: ESOPHAGOGASTRODUODENOSCOPY (EGD) WITH PROPOFOL;  Surgeon: [name], MD;  Location: [facility];  Service: Endoscopy;  Laterality: N/A;  . GANGLION CYST EXCISION Right    wrist  . HERNIA REPAIR     2011  . KNEE ARTHROSCOPY W/ ACL RECONSTRUCTION Right 2008  . KNEE SURGERY  08/05/2007  . LASIK    . NISSEN FUNDOPLICATION  1287    Medical History: Past Medical History:  Diagnosis Date  . Coronary artery disease   . GERD (gastroesophageal reflux disease)   . Glaucoma   . Headache    migraines  . History of shingles   . Hypertension   . Pre-diabetes   . Pulmonary embolism on right [facility]) 2007   right lung  . Sjogren's syndrome ([place])   . Stroke [facility]) 1998    Family History: Family History  Problem Relation Age of Onset  . Congestive Heart Failure Mother   . Parkinson's disease Mother   . Diabetes Father   . Scleroderma Sister   . Cancer Brother     Social History   Socioeconomic History  . Marital status: Married    Spouse name: Not on file  . Number of children: 1  . Years of education: Not on file  . Highest education level: Some college, no degree  Occupational History  . Occupation: retired  Scientific laboratory technician  . Financial resource strain: Not hard at all  . Food insecurity:    Worry: Never true    Inability: Never true  . Transportation needs:    Medical: No    Non-medical: No  Tobacco Use  . Smoking status: Never Smoker  . Smokeless tobacco: Never Used  Substance and Sexual Activity  . Alcohol use: No    Alcohol/week: 0.0 standard drinks  . Drug use: No  . Sexual activity: Not on file  Lifestyle  . Physical activity:    Days per week: Not on file    Minutes per session: Not on file  . Stress: Only a little  Relationships  . Social connections:    Talks on phone: Not on file    Gets together: Not on file    Attends religious service: Not on file     Active member of club or organization: Not on file    Attends meetings of clubs or organizations: Not on file    Relationship status: Not on file  . Intimate partner violence:    Fear of current or ex partner: Not on file  Emotionally abused: Not on file    Physically abused: Not on file    Forced sexual activity: Not on file  Other Topics Concern  . Not on file  Social History Narrative  . Not on file     Review of Systems  Constitutional: Negative for activity change, chills, fatigue and unexpected weight change.  HENT: Positive for congestion, postnasal drip, sinus pressure, sinus pain and sore throat. Negative for rhinorrhea and sneezing.   Respiratory: Positive for cough. Negative for chest tightness and shortness of breath.   Cardiovascular: Negative for chest pain and palpitations.  Gastrointestinal: Positive for nausea. Negative for abdominal pain, constipation, diarrhea and vomiting.  Endocrine: Negative for cold intolerance, heat intolerance, polydipsia and polyuria.  Musculoskeletal: Negative for arthralgias, back pain, joint swelling and neck pain.  Skin: Negative for rash.  Allergic/Immunologic: Positive for environmental allergies.  Neurological: Positive for headaches. Negative for tremors and numbness.  Hematological: Negative for adenopathy. Does not bruise/bleed easily.  Psychiatric/Behavioral: Negative for behavioral problems (Depression), sleep disturbance and suicidal ideas. The patient is not nervous/anxious.     Today's Vitals   05/08/18 1431  BP: 124/69  Pulse: 85  Resp: 16  Temp: 98.3 F (36.8 C)  SpO2: 96%  Weight: 185 lb (83.9 kg)  Height: 5' 6"  (1.676 m)   Body mass index is 29.86 kg/m.   Physical Exam Vitals signs reviewed.  Constitutional:      General: She is not in acute distress.    Appearance: Normal appearance. She is well-developed. She is not diaphoretic.  HENT:     Head: Normocephalic and atraumatic.     Right Ear:  Tympanic membrane is bulging.     Left Ear: Tympanic membrane is bulging.     Nose: Congestion present.     Mouth/Throat:     Pharynx: No oropharyngeal exudate.  Eyes:     Pupils: Pupils are equal, round, and reactive to light.  Neck:     Musculoskeletal: Normal range of motion and neck supple.     Thyroid: No thyromegaly.     Vascular: No carotid bruit or JVD.     Trachea: No tracheal deviation.  Cardiovascular:     Rate and Rhythm: Normal rate and regular rhythm.     Heart sounds: Normal heart sounds. No murmur. No friction rub. No gallop.   Pulmonary:     Effort: Pulmonary effort is normal. No respiratory distress.     Breath sounds: Normal breath sounds. No wheezing or rales.  Chest:     Chest wall: No tenderness.  Abdominal:     General: Bowel sounds are normal.     Palpations: Abdomen is soft.     Tenderness: There is no abdominal tenderness.  Musculoskeletal: Normal range of motion.  Lymphadenopathy:     Cervical: No cervical adenopathy.  Skin:    General: Skin is warm and dry.  Neurological:     Mental Status: She is alert and oriented to person, place, and time.     Cranial Nerves: No cranial nerve deficit.  Psychiatric:        Behavior: Behavior normal.        Thought Content: Thought content normal.        Judgment: Judgment normal.   Assessment/Plan: 1. Acute non-recurrent pansinusitis Start z-pack. Take as directed for 5 days. Rest and increase fluids. Take OTC medication as indicated and as needed to improve symptoms.  - azithromycin (ZITHROMAX) 250 MG tablet; z-pack - take as directed for  5 days  Dispense: 6 tablet; Refill: 0  2. Herpes zoster without complication Recurrent infection. Take valtrex 102m up to three times daily for 7 days when needed.  - valACYclovir (VALTREX) 1000 MG tablet; Take 1 tablet (1,000 mg total) by mouth 3 (three) times daily.  Dispense: 21 tablet; Refill: 2  3. Benign essential HTN Stable. Continue bp medication as prescribed.    General Counseling: Khyla verbalizes understanding of the findings of todays visit and agrees with plan of treatment. I have discussed any further diagnostic evaluation that may be needed or ordered today. We also reviewed her medications today. she has been encouraged to call the office with any questions or concerns that should arise related to todays visit.    Counseling:  Hypertension Counseling:   The following hypertensive lifestyle modification were recommended and discussed:  1. Limiting alcohol intake to less than 1 oz/day of ethanol:(24 oz of beer or 8 oz of wine or 2 oz of 100-proof whiskey). 2. Take baby ASA 81 mg daily. 3. Importance of regular aerobic exercise and losing weight. 4. Reduce dietary saturated fat and cholesterol intake for overall cardiovascular health. 5. Maintaining adequate dietary potassium, calcium, and magnesium intake. 6. Regular monitoring of the blood pressure. 7. Reduce sodium intake to less than 100 mmol/day (less than 2.3 gm of sodium or less than 6 gm of sodium choride)   This patient was seen by [facility] Dr FLavera Guiseas a part of collaborative care agreement  Meds ordered this encounter  Medications  . valACYclovir (VALTREX) 1000 MG tablet    Sig: Take 1 tablet (1,000 mg total) by mouth 3 (three) times daily.    Dispense:  21 tablet    Refill:  2    Order Specific Question:   Supervising Provider    Answer:   KLavera Guise[[[id number]] . azithromycin (ZITHROMAX) 250 MG tablet    Sig: z-pack - take as directed for 5 days    Dispense:  6 tablet    Refill:  0    Order Specific Question:   Supervising Provider    Answer:   KLavera Guise[[[id number]]   Time spent: 30 Minutes

## 2018-05-10 ENCOUNTER — Encounter: Admission: RE | Payer: Self-pay | Source: Home / Self Care

## 2018-05-10 ENCOUNTER — Ambulatory Visit: Admission: RE | Admit: 2018-05-10 | Payer: Medicare Other | Source: Home / Self Care | Admitting: Podiatry

## 2018-05-10 SURGERY — OSTEOTOMY, WEIL
Anesthesia: Choice | Laterality: Right

## 2018-05-14 ENCOUNTER — Ambulatory Visit: Payer: Self-pay | Admitting: Nurse Practitioner

## 2018-05-20 DIAGNOSIS — J0140 Acute pansinusitis, unspecified: Secondary | ICD-10-CM | POA: Insufficient documentation

## 2018-05-29 ENCOUNTER — Encounter: Payer: Self-pay | Admitting: Nurse Practitioner

## 2018-06-19 ENCOUNTER — Ambulatory Visit: Payer: Self-pay

## 2018-09-26 ENCOUNTER — Other Ambulatory Visit: Payer: Self-pay

## 2018-09-26 ENCOUNTER — Encounter: Payer: Self-pay | Admitting: Nurse Practitioner

## 2018-09-26 ENCOUNTER — Ambulatory Visit
Admission: RE | Admit: 2018-09-26 | Discharge: 2018-09-26 | Disposition: A | Discharge status: Home / Self Care | Payer: Medicare Other | Source: Ambulatory Visit | Attending: Nurse Practitioner | Admitting: Nurse Practitioner

## 2018-09-26 ENCOUNTER — Other Ambulatory Visit: Payer: Self-pay | Admitting: Nurse Practitioner

## 2018-09-26 ENCOUNTER — Ambulatory Visit (INDEPENDENT_AMBULATORY_CARE_PROVIDER_SITE_OTHER): Payer: Medicare Other | Admitting: Nurse Practitioner

## 2018-09-26 ENCOUNTER — Ambulatory Visit
Admission: RE | Admit: 2018-09-26 | Discharge: 2018-09-26 | Disposition: A | Discharge status: Home / Self Care | Payer: Medicare Other | Attending: Nurse Practitioner | Admitting: Nurse Practitioner

## 2018-09-26 VITALS — BP 124/73 | HR 71 | Resp 16 | Ht 67.0 in | Wt 186.8 lb

## 2018-09-26 DIAGNOSIS — R05 Cough: Secondary | ICD-10-CM | POA: Diagnosis not present

## 2018-09-26 DIAGNOSIS — R079 Chest pain, unspecified: Secondary | ICD-10-CM | POA: Diagnosis not present

## 2018-09-26 DIAGNOSIS — R091 Pleurisy: Secondary | ICD-10-CM | POA: Insufficient documentation

## 2018-09-26 DIAGNOSIS — E559 Vitamin D deficiency, unspecified: Secondary | ICD-10-CM | POA: Diagnosis not present

## 2018-09-26 DIAGNOSIS — I10 Essential (primary) hypertension: Secondary | ICD-10-CM | POA: Diagnosis not present

## 2018-09-26 NOTE — Progress Notes (Signed)
[facility] [place], [place] [address]  Internal MEDICINE  Office Visit Note  Patient Name: Virginia Phillips  [id number]  [id number]  Date of Service: 09/26/2018   Pt is here for a sick visit.  Chief Complaint  Patient presents with  . Chest Pain    since last week, pain goes through back and shoulders  . Nausea     The patient is here for sick visit. She started having right upper abdominal pain a week ago. Happens with exertion. Feels like a bubble in her upper belly. After this happened, she continued to clean her home, running the vacuum. The next day, her chest and back were hurting. She did rest. Saturday and Sunday the chest and back continued to hurt. She did get a little nauseated. Took alka selter. Also developed a headache. She took sinus pain reliever. This helped the headache. Had similar symptoms last year. Had ECG and echocardiogram done 03/2018, and both were normal. ECG today is within normal limits. She does have a dry cough which is intermittent.        Current Medication:  Outpatient Encounter Medications as of 09/26/2018  Medication Sig Note  . aspirin 81 MG tablet Take 162 mg by mouth daily at 2 PM.    . aspirin-acetaminophen-caffeine (EXCEDRIN MIGRAINE) 250-250-65 MG tablet Take 2 tablets by mouth daily as needed for headache or migraine.    Marland Kitchen aspirin-sod bicarb-citric acid (ALKA-SELTZER) 325 MG TBEF tablet Take 650 mg by mouth every 6 (six) hours as needed (indigestion).   . Biotin (BIOTIN 5000) 5 MG CAPS Take 5,000 mg by mouth daily at 2 PM.   . BLACK CURRANT SEED OIL PO Take 1 capsule by mouth daily at 2 PM.   . Cholecalciferol (VITAMIN D) 2000 UNITS CAPS Take 2,000 Units by mouth daily at 2 PM.    . Coenzyme Q10 (CO Q10) 200 MG CAPS Take 200 mg by mouth daily at 2 PM.    . ibuprofen (ADVIL,MOTRIN) 200 MG tablet Take 400 mg by mouth daily as needed for headache or moderate pain.    . Lancets (ONETOUCH ULTRASOFT) lancets Check  sugar once daily DX 73.9 needs for one touch ultra 2 lancets   . lidocaine (LIDODERM) 5 % Place 1 patch onto the skin daily as needed (pain). Remove & Discard patch within 12 hours or as directed by MD   . Misc Natural Products (PETADOLEX 50) 50 MG CAPS Take 100 mg by mouth 2 (two) times daily at 10 AM and 5 PM.   . ONE TOUCH ULTRA TEST test strip CHECK BLOOD SUGAR ONCE DAILY AS DIRECTED   . polyethylene glycol (MIRALAX / GLYCOLAX) packet Take 17 g by mouth daily.    . Polyvinyl Alcohol-Povidone (REFRESH OP) Place 1 drop into both eyes daily as needed (dry eyes).   . pravastatin (PRAVACHOL) 10 MG tablet TAKE 1 TABLET(10 MG) BY MOUTH AT BEDTIME (Patient taking differently: Take 10 mg by mouth at bedtime. )   . pyridOXINE (VITAMIN B-6) 100 MG tablet Take 100 mg by mouth daily at 2 PM.    . Riboflavin (B2) 100 MG TABS Take 100 mg by mouth 2 (two) times daily.   . SUPER B COMPLEX/C PO Take 1 tablet by mouth daily at 2 PM.   . Thiamine HCl (VITAMIN B-1) 250 MG tablet Take 250 mg by mouth daily at 2 PM.   . valsartan (DIOVAN) 80 MG tablet Take 80 mg by mouth daily  at 2 PM.    . venlafaxine XR (EFFEXOR-XR) 75 MG 24 hr capsule Take 75 mg by mouth daily at 2 PM.   . vitamin B-12 (CYANOCOBALAMIN) 1000 MCG tablet Take 1,000 mcg by mouth daily at 2 PM.   . vitamin C (ASCORBIC ACID) 500 MG tablet Take 500 mg by mouth daily at 2 PM.   . [DISCONTINUED] azithromycin (ZITHROMAX) 250 MG tablet z-pack - take as directed for 5 days (Patient not taking: Reported on 09/26/2018)   . [DISCONTINUED] enoxaparin (LOVENOX) 40 MG/0.4ML injection Inject 40 mg into the skin daily. 04/25/2018: Post op  . [DISCONTINUED] valACYclovir (VALTREX) 1000 MG tablet Take 1 tablet (1,000 mg total) by mouth 3 (three) times daily. (Patient not taking: Reported on 09/26/2018)   . [DISCONTINUED] venlafaxine (EFFEXOR) 75 MG tablet TAKE 1 TABLET(75 MG) BY MOUTH DAILY    No facility-administered encounter medications on file as of 09/26/2018.        Medical History: Past Medical History:  Diagnosis Date  . Coronary artery disease   . GERD (gastroesophageal reflux disease)   . Glaucoma   . Headache    migraines  . History of shingles   . Hypertension   . Pre-diabetes   . Pulmonary embolism on right [facility]) 2007   right lung  . Sjogren's syndrome ([place])   . Stroke ([place]) 1998     Today's Vitals   09/26/18 1052  BP: 124/73  Pulse: 71  Resp: 16  SpO2: 93%  Weight: 186 lb 12.8 oz (84.7 kg)  Height: 5' 7"  (1.702 m)   Body mass index is 29.26 kg/m.  Review of Systems  Constitutional: Negative for chills, fatigue and unexpected weight change.  HENT: Negative for congestion, postnasal drip, rhinorrhea, sneezing and sore throat.   Respiratory: Positive for cough. Negative for chest tightness and shortness of breath.   Cardiovascular: Positive for chest pain. Negative for palpitations.  Gastrointestinal: Negative for abdominal pain, constipation, diarrhea, nausea and vomiting.  Endocrine: Negative for cold intolerance, heat intolerance, polydipsia and polyuria.  Musculoskeletal: Negative for arthralgias, back pain, joint swelling and neck pain.  Skin: Negative for rash.  Neurological: Positive for headaches. Negative for dizziness, tremors and numbness.  Hematological: Negative for adenopathy. Does not bruise/bleed easily.  Psychiatric/Behavioral: Negative for behavioral problems (Depression), sleep disturbance and suicidal ideas. The patient is not nervous/anxious.     Physical Exam Vitals signs and nursing note reviewed.  Constitutional:      General: She is not in acute distress.    Appearance: Normal appearance. She is well-developed. She is not diaphoretic.  HENT:     Head: Normocephalic and atraumatic.     Mouth/Throat:     Pharynx: No oropharyngeal exudate.  Eyes:     Pupils: Pupils are equal, round, and reactive to light.  Neck:     Musculoskeletal: Normal range of motion and neck supple.     Thyroid:  No thyromegaly.     Vascular: No carotid bruit or JVD.     Trachea: No tracheal deviation.  Cardiovascular:     Rate and Rhythm: Normal rate and regular rhythm.     Heart sounds: Normal heart sounds. No murmur. No friction rub. No gallop.      Comments: ECG today is within normal limits  Pulmonary:     Effort: Pulmonary effort is normal. No respiratory distress.     Breath sounds: Normal breath sounds. No wheezing or rales.     Comments: Intermittent dry cough. Chest:  Chest wall: No tenderness.  Abdominal:     General: Bowel sounds are normal.     Palpations: Abdomen is soft.     Tenderness: There is no abdominal tenderness.  Musculoskeletal: Normal range of motion.  Lymphadenopathy:     Cervical: No cervical adenopathy.  Skin:    General: Skin is warm and dry.  Neurological:     Mental Status: She is alert and oriented to person, place, and time.     Cranial Nerves: No cranial nerve deficit.  Psychiatric:        Behavior: Behavior normal.        Thought Content: Thought content normal.        Judgment: Judgment normal.    Assessment/Plan: 1. Chest pain, unspecified type ECG today is within normal limits. Will get chest x-ray for further evaluation.  - EKG 12-Lead - DG Chest 2 View; Future  2. Pleurisy Will get routine labs and include ANA with reflex if possible, RA, and sed rate for further evaluation.   3. Benign essential HTN Stable. Continue bp medication as prescribed   General Counseling: Celinda verbalizes understanding of the findings of todays visit and agrees with plan of treatment. I have discussed any further diagnostic evaluation that may be needed or ordered today. We also reviewed her medications today. she has been encouraged to call the office with any questions or concerns that should arise related to todays visit.    Counseling:  This patient was seen by Leretha Pol FNP Collaboration with Dr Lavera Guise as a part of collaborative care  agreement  Orders Placed This Encounter  Procedures  . DG Chest 2 View  . EKG 12-Lead      Time spent: 30 Minutes

## 2018-09-27 LAB — COMPREHENSIVE METABOLIC PANEL
ALT: 34 IU/L — ABNORMAL HIGH (ref 0–32)
AST: 28 IU/L (ref 0–40)
Albumin/Globulin Ratio: 2 (ref 1.2–2.2)
Albumin: 4.4 g/dL (ref 3.7–4.7)
Alkaline Phosphatase: 74 IU/L (ref 39–117)
BUN/Creatinine Ratio: 18 (ref 12–28)
BUN: 15 mg/dL (ref 8–27)
Bilirubin Total: 0.3 mg/dL (ref 0.0–1.2)
CO2: 22 mmol/L (ref 20–29)
Calcium: 9.5 mg/dL (ref 8.7–10.3)
Chloride: 103 mmol/L (ref 96–106)
Creatinine, Ser: 0.84 mg/dL (ref 0.57–1.00)
GFR calc Af Amer: 80 mL/min/{1.73_m2} (ref >59)
GFR calc non Af Amer: 70 mL/min/{1.73_m2} (ref >59)
Globulin, Total: 2.2 g/dL (ref 1.5–4.5)
Glucose: 123 mg/dL — ABNORMAL HIGH (ref 65–99)
Potassium: 4.9 mmol/L (ref 3.5–5.2)
Sodium: 141 mmol/L (ref 134–144)
Total Protein: 6.6 g/dL (ref 6.0–8.5)

## 2018-09-27 LAB — CBC
Hematocrit: 43.3 % (ref 34.0–46.6)
Hemoglobin: 14.3 g/dL (ref 11.1–15.9)
MCH: 31.4 pg (ref 26.6–33.0)
MCHC: 33 g/dL (ref 31.5–35.7)
MCV: 95 fL (ref 79–97)
Platelets: 254 10*3/uL (ref 150–450)
RBC: 4.55 x10E6/uL (ref 3.77–5.28)
RDW: 12.5 % (ref 11.7–15.4)
WBC: 6.6 10*3/uL (ref 3.4–10.8)

## 2018-09-27 LAB — VITAMIN D 25 HYDROXY (VIT D DEFICIENCY, FRACTURES): Vit D, 25-Hydroxy: 41.4 ng/mL (ref 30.0–100.0)

## 2018-09-27 LAB — ANA W/REFLEX IF POSITIVE: Anti Nuclear Antibody (ANA): NEGATIVE

## 2018-09-27 LAB — SEDIMENTATION RATE: Sed Rate: 7 mm/hr (ref 0–40)

## 2018-09-27 LAB — RHEUMATOID FACTOR: Rheumatoid fact SerPl-aCnc: <10 IU/mL (ref 0.0–13.9)

## 2018-09-27 LAB — TSH: TSH: 2.09 u[IU]/mL (ref 0.450–4.500)

## 2018-09-27 LAB — T4, FREE: Free T4: 0.86 ng/dL (ref 0.82–1.77)

## 2018-10-09 NOTE — Progress Notes (Signed)
Chest x-ray stable. Discuss at next visit

## 2018-10-09 NOTE — Progress Notes (Signed)
Labs good. Discuss at next visit

## 2018-10-10 DIAGNOSIS — E782 Mixed hyperlipidemia: Secondary | ICD-10-CM | POA: Diagnosis not present

## 2018-10-10 DIAGNOSIS — R0789 Other chest pain: Secondary | ICD-10-CM | POA: Diagnosis not present

## 2018-10-10 DIAGNOSIS — I10 Essential (primary) hypertension: Secondary | ICD-10-CM | POA: Diagnosis not present

## 2018-10-10 DIAGNOSIS — M7989 Other specified soft tissue disorders: Secondary | ICD-10-CM | POA: Diagnosis not present

## 2018-10-22 ENCOUNTER — Encounter: Payer: Self-pay | Admitting: Nurse Practitioner

## 2018-10-22 ENCOUNTER — Ambulatory Visit (INDEPENDENT_AMBULATORY_CARE_PROVIDER_SITE_OTHER): Payer: Medicare Other | Admitting: Nurse Practitioner

## 2018-10-22 ENCOUNTER — Other Ambulatory Visit: Payer: Self-pay

## 2018-10-22 VITALS — BP 138/79 | HR 70 | Resp 16 | Ht 66.0 in | Wt 187.0 lb

## 2018-10-22 DIAGNOSIS — R7301 Impaired fasting glucose: Secondary | ICD-10-CM

## 2018-10-22 DIAGNOSIS — R079 Chest pain, unspecified: Secondary | ICD-10-CM

## 2018-10-22 DIAGNOSIS — I10 Essential (primary) hypertension: Secondary | ICD-10-CM | POA: Diagnosis not present

## 2018-10-22 LAB — POCT GLYCOSYLATED HEMOGLOBIN (HGB A1C): Hemoglobin A1C: 6 % — AB (ref 4.0–5.6)

## 2018-10-22 NOTE — Progress Notes (Signed)
[facility], [place] [address]  Internal MEDICINE  Office Visit Note  Patient Name: Virginia Phillips  [id number]  [id number]  Date of Service: 10/23/2018  Chief Complaint  Patient presents with  . Follow-up    labs and cxr  . Spots and/or Floaters    on arms and legs    The patient is here for follow up visit. She is here to review labs and chest x-ray. She states that she has not had any further episodes of chest burning or discomfort. Her ECG, done at her last visit was within normal limits. Her chest x-ray was stable with no evidence of acute cardiopulmonary disease. Labs looked very good. Blood sugar was mildly elevated. Her HgbA1c is 6.0 in the office today. Discussed pre-diabetes as well as diet and lifestyle changes which will help to lower blood sugars without adding medications. She has no new concerns or complaints today.       Current Medication: Outpatient Encounter Medications as of 10/22/2018  Medication Sig  . aspirin 81 MG tablet Take 162 mg by mouth daily at 2 PM.   . aspirin-acetaminophen-caffeine (EXCEDRIN MIGRAINE) 250-250-65 MG tablet Take 2 tablets by mouth daily as needed for headache or migraine.   Marland Kitchen aspirin-sod bicarb-citric acid (ALKA-SELTZER) 325 MG TBEF tablet Take 650 mg by mouth every 6 (six) hours as needed (indigestion).  . Biotin (BIOTIN 5000) 5 MG CAPS Take 5,000 mg by mouth daily at 2 PM.  . BLACK CURRANT SEED OIL PO Take 1 capsule by mouth daily at 2 PM.  . Cholecalciferol (VITAMIN D) 2000 UNITS CAPS Take 2,000 Units by mouth daily at 2 PM.   . Coenzyme Q10 (CO Q10) 200 MG CAPS Take 200 mg by mouth daily at 2 PM.   . ibuprofen (ADVIL,MOTRIN) 200 MG tablet Take 400 mg by mouth daily as needed for headache or moderate pain.   . Lancets (ONETOUCH ULTRASOFT) lancets Check sugar once daily DX 73.9 needs for one touch ultra 2 lancets  . lidocaine (LIDODERM) 5 % Place 1 patch onto the skin daily as needed (pain). Remove &  Discard patch within 12 hours or as directed by MD  . Misc Natural Products (PETADOLEX 50) 50 MG CAPS Take 100 mg by mouth 2 (two) times daily at 10 AM and 5 PM.  . ONE TOUCH ULTRA TEST test strip CHECK BLOOD SUGAR ONCE DAILY AS DIRECTED  . polyethylene glycol (MIRALAX / GLYCOLAX) packet Take 17 g by mouth daily.   . Polyvinyl Alcohol-Povidone (REFRESH OP) Place 1 drop into both eyes daily as needed (dry eyes).  . pravastatin (PRAVACHOL) 10 MG tablet TAKE 1 TABLET(10 MG) BY MOUTH AT BEDTIME (Patient taking differently: Take 10 mg by mouth at bedtime. )  . pyridOXINE (VITAMIN B-6) 100 MG tablet Take 100 mg by mouth daily at 2 PM.   . Riboflavin (B2) 100 MG TABS Take 100 mg by mouth 2 (two) times daily.  . SUPER B COMPLEX/C PO Take 1 tablet by mouth daily at 2 PM.  . Thiamine HCl (VITAMIN B-1) 250 MG tablet Take 250 mg by mouth daily at 2 PM.  . valsartan (DIOVAN) 80 MG tablet Take 80 mg by mouth daily at 2 PM.   . venlafaxine XR (EFFEXOR-XR) 75 MG 24 hr capsule Take 75 mg by mouth daily at 2 PM.  . vitamin B-12 (CYANOCOBALAMIN) 1000 MCG tablet Take 1,000 mcg by mouth daily at 2 PM.  . vitamin C (ASCORBIC  ACID) 500 MG tablet Take 500 mg by mouth daily at 2 PM.   No facility-administered encounter medications on file as of 10/22/2018.     Surgical History: Past Surgical History:  Procedure Laterality Date  . ANKLE FRACTURE SURGERY Right 2007  . COLONOSCOPY WITH PROPOFOL N/A 05/10/2017   Procedure: COLONOSCOPY WITH PROPOFOL;  Surgeon: [name], MD;  Location: [facility] END[facility]COPY;  Service: Endoscopy;  Laterality: N/A;  . ESOPHAGOGASTRODUODENOSCOPY (EGD) WITH PROPOFOL N/A 10/17/2017   Procedure: ESOPHAGOGASTRODUODENOSCOPY (EGD) WITH PROPOFOL;  Surgeon: [name], MD;  Location: [facility] ENDOSCOPY;  Service: Endoscopy;  Laterality: N/A;  . GANGLION CYST EXCISION Right    wrist  . HERNIA REPAIR     2011  . KNEE ARTHROSCOPY W/ ACL RECONSTRUCTION Right 2008  . KNEE SURGERY  08/05/2007   . LASIK    . NISSEN FUNDOPLICATION  [id number]    Medical History: Past Medical History:  Diagnosis Date  . Coronary artery disease   . GERD (gastroesophageal reflux disease)   . Glaucoma   . Headache    migraines  . History of shingles   . Hypertension   . Pre-diabetes   . Pulmonary embolism on right [facility]) 2007   right lung  . Sjogren's syndrome ([place])   . Stroke [facility]) 1998    Family History: Family History  Problem Relation Age of Onset  . Congestive Heart Failure Mother   . Parkinson's disease Mother   . Diabetes Father   . Scleroderma Sister   . Cancer Brother     Social History   Socioeconomic History  . Marital status: Married    Spouse name: Not on file  . Number of children: 1  . Years of education: Not on file  . Highest education level: Some college, no degree  Occupational History  . Occupation: retired  Scientific laboratory technician  . Financial resource strain: Not hard at all  . Food insecurity    Worry: Never true    Inability: Never true  . Transportation needs    Medical: No    Non-medical: No  Tobacco Use  . Smoking status: Never Smoker  . Smokeless tobacco: Never Used  Substance and Sexual Activity  . Alcohol use: No    Alcohol/week: 0.0 standard drinks  . Drug use: No  . Sexual activity: Not on file  Lifestyle  . Physical activity    Days per week: Not on file    Minutes per session: Not on file  . Stress: Only a little  Relationships  . Social Herbalist on phone: Not on file    Gets together: Not on file    Attends religious service: Not on file    Active member of club or organization: Not on file    Attends meetings of clubs or organizations: Not on file    Relationship status: Not on file  . Intimate partner violence    Fear of current or ex partner: Not on file    Emotionally abused: Not on file    Physically abused: Not on file    Forced sexual activity: Not on file  Other Topics Concern  . Not on file  Social History  Narrative  . Not on file      Review of Systems  Constitutional: Positive for fatigue. Negative for chills and unexpected weight change.  HENT: Negative for congestion, postnasal drip, rhinorrhea, sneezing and sore throat.   Respiratory: Negative for cough, chest tightness and shortness of breath.  Cardiovascular: Negative for chest pain and palpitations.       No further episodes of chest pain since her last visit.   Gastrointestinal: Negative for abdominal pain, constipation, diarrhea, nausea and vomiting.  Endocrine: Negative for cold intolerance, heat intolerance, polydipsia and polyuria.  Musculoskeletal: Negative for arthralgias, back pain, joint swelling and neck pain.  Skin: Negative for rash.  Neurological: Positive for headaches. Negative for dizziness, tremors and numbness.  Hematological: Negative for adenopathy. Does not bruise/bleed easily.  Psychiatric/Behavioral: Negative for behavioral problems (Depression), sleep disturbance and suicidal ideas. The patient is not nervous/anxious.     Today's Vitals   10/22/18 1025  BP: 138/79  Pulse: 70  Resp: 16  SpO2: 91%  Weight: 187 lb (84.8 kg)  Height: 5' 6"  (1.676 m)   Body mass index is 30.18 kg/m.  Physical Exam Vitals signs and nursing note reviewed.  Constitutional:      General: She is not in acute distress.    Appearance: Normal appearance. She is well-developed. She is not diaphoretic.  HENT:     Head: Normocephalic and atraumatic.     Mouth/Throat:     Pharynx: No oropharyngeal exudate.  Eyes:     Pupils: Pupils are equal, round, and reactive to light.  Neck:     Musculoskeletal: Normal range of motion and neck supple.     Thyroid: No thyromegaly.     Vascular: No carotid bruit or JVD.     Trachea: No tracheal deviation.  Cardiovascular:     Rate and Rhythm: Normal rate and regular rhythm.     Heart sounds: Normal heart sounds. No murmur. No friction rub. No gallop.   Pulmonary:     Effort:  Pulmonary effort is normal. No respiratory distress.     Breath sounds: Normal breath sounds. No wheezing or rales.  Chest:     Chest wall: No tenderness.  Abdominal:     General: Bowel sounds are normal.     Palpations: Abdomen is soft.     Tenderness: There is no abdominal tenderness.  Musculoskeletal: Normal range of motion.  Lymphadenopathy:     Cervical: No cervical adenopathy.  Skin:    General: Skin is warm and dry.  Neurological:     Mental Status: She is alert and oriented to person, place, and time.     Cranial Nerves: No cranial nerve deficit.  Psychiatric:        Behavior: Behavior normal.        Thought Content: Thought content normal.        Judgment: Judgment normal.   Assessment/Plan: 1. Impaired fasting glucose - POCT HgB A1C 6.0 today. Discussed prediabetes and diet and ilfestyle changes needed to lower blood sugars without adding medications. She voiced understanding.   2. Chest pain, unspecified type Reviewed chest x-ray which was stable and without evidence of cardiopulmonary disease. She is followed by cardiology   3. Benign essential HTN Stable. Continue bp medication as prescribed   General Counseling: Sondos verbalizes understanding of the findings of todays visit and agrees with plan of treatment. I have discussed any further diagnostic evaluation that may be needed or ordered today. We also reviewed her medications today. she has been encouraged to call the office with any questions or concerns that should arise related to todays visit.  Diabetes Counseling:  1. Addition of ACE inh/ ARB'S for nephroprotection. Microalbumin is updated  2. Diabetic foot care, prevention of complications. Podiatry consult 3. Exercise and lose weight.  4.  Diabetic eye examination, Diabetic eye exam is updated  5. Monitor blood sugar closlely. nutrition counseling.  6. Sign and symptoms of hypoglycemia including shaking sweating,confusion and headaches.   This patient  was seen by Leretha Pol FNP Collaboration with Dr Lavera Guise as a part of collaborative care agreement  Orders Placed This Encounter  Procedures  . POCT HgB A1C   Time spent: 25 Minutes      Dr Lavera Guise Internal medicine

## 2018-10-23 DIAGNOSIS — R7301 Impaired fasting glucose: Secondary | ICD-10-CM | POA: Insufficient documentation

## 2018-10-28 ENCOUNTER — Other Ambulatory Visit: Payer: Self-pay | Admitting: Nurse Practitioner

## 2018-10-28 DIAGNOSIS — E756 Lipid storage disorder, unspecified: Secondary | ICD-10-CM | POA: Diagnosis not present

## 2018-10-29 LAB — LIPID PANEL W/O CHOL/HDL RATIO
Cholesterol, Total: 189 mg/dL (ref 100–199)
HDL: 61 mg/dL (ref >39)
LDL Chol Calc (NIH): 100 mg/dL — ABNORMAL HIGH (ref 0–99)
Triglycerides: 161 mg/dL — ABNORMAL HIGH (ref 0–149)
VLDL Cholesterol Cal: 28 mg/dL (ref 5–40)

## 2018-11-06 NOTE — Progress Notes (Signed)
Labs good. Discussed at most recent visit.

## 2018-12-11 DIAGNOSIS — Z23 Encounter for immunization: Secondary | ICD-10-CM | POA: Diagnosis not present

## 2019-03-23 DIAGNOSIS — Z23 Encounter for immunization: Secondary | ICD-10-CM | POA: Diagnosis not present

## 2019-04-13 DIAGNOSIS — Z23 Encounter for immunization: Secondary | ICD-10-CM | POA: Diagnosis not present

## 2019-05-05 ENCOUNTER — Other Ambulatory Visit: Payer: Self-pay

## 2019-05-05 ENCOUNTER — Emergency Department: Payer: Medicare Other

## 2019-05-05 ENCOUNTER — Encounter: Payer: Self-pay | Admitting: Emergency Medicine

## 2019-05-05 ENCOUNTER — Emergency Department
Admission: EM | Admit: 2019-05-05 | Discharge: 2019-05-05 | Disposition: A | Discharge status: Home / Self Care | Payer: Medicare Other | Attending: Emergency Medicine | Admitting: Emergency Medicine

## 2019-05-05 ENCOUNTER — Telehealth: Payer: Self-pay

## 2019-05-05 DIAGNOSIS — Y929 Unspecified place or not applicable: Secondary | ICD-10-CM | POA: Diagnosis not present

## 2019-05-05 DIAGNOSIS — M25512 Pain in left shoulder: Secondary | ICD-10-CM | POA: Diagnosis not present

## 2019-05-05 DIAGNOSIS — W19XXXA Unspecified fall, initial encounter: Secondary | ICD-10-CM | POA: Diagnosis not present

## 2019-05-05 DIAGNOSIS — I10 Essential (primary) hypertension: Secondary | ICD-10-CM | POA: Insufficient documentation

## 2019-05-05 DIAGNOSIS — S8991XA Unspecified injury of right lower leg, initial encounter: Secondary | ICD-10-CM | POA: Diagnosis not present

## 2019-05-05 DIAGNOSIS — R0789 Other chest pain: Secondary | ICD-10-CM | POA: Insufficient documentation

## 2019-05-05 DIAGNOSIS — Z7982 Long term (current) use of aspirin: Secondary | ICD-10-CM | POA: Insufficient documentation

## 2019-05-05 DIAGNOSIS — Y939 Activity, unspecified: Secondary | ICD-10-CM | POA: Insufficient documentation

## 2019-05-05 DIAGNOSIS — Z79899 Other long term (current) drug therapy: Secondary | ICD-10-CM | POA: Insufficient documentation

## 2019-05-05 DIAGNOSIS — M79642 Pain in left hand: Secondary | ICD-10-CM | POA: Diagnosis not present

## 2019-05-05 DIAGNOSIS — S4992XA Unspecified injury of left shoulder and upper arm, initial encounter: Secondary | ICD-10-CM | POA: Diagnosis not present

## 2019-05-05 DIAGNOSIS — Z7401 Bed confinement status: Secondary | ICD-10-CM | POA: Diagnosis not present

## 2019-05-05 DIAGNOSIS — Z8673 Personal history of transient ischemic attack (TIA), and cerebral infarction without residual deficits: Secondary | ICD-10-CM | POA: Diagnosis not present

## 2019-05-05 DIAGNOSIS — S52502A Unspecified fracture of the lower end of left radius, initial encounter for closed fracture: Secondary | ICD-10-CM

## 2019-05-05 DIAGNOSIS — M79609 Pain in unspecified limb: Secondary | ICD-10-CM

## 2019-05-05 DIAGNOSIS — S52592A Other fractures of lower end of left radius, initial encounter for closed fracture: Secondary | ICD-10-CM | POA: Diagnosis not present

## 2019-05-05 DIAGNOSIS — S59912A Unspecified injury of left forearm, initial encounter: Secondary | ICD-10-CM | POA: Diagnosis not present

## 2019-05-05 DIAGNOSIS — Y998 Other external cause status: Secondary | ICD-10-CM | POA: Insufficient documentation

## 2019-05-05 DIAGNOSIS — S6992XA Unspecified injury of left wrist, hand and finger(s), initial encounter: Secondary | ICD-10-CM | POA: Diagnosis not present

## 2019-05-05 DIAGNOSIS — R52 Pain, unspecified: Secondary | ICD-10-CM | POA: Diagnosis not present

## 2019-05-05 DIAGNOSIS — M79661 Pain in right lower leg: Secondary | ICD-10-CM | POA: Insufficient documentation

## 2019-05-05 DIAGNOSIS — S8992XA Unspecified injury of left lower leg, initial encounter: Secondary | ICD-10-CM | POA: Diagnosis not present

## 2019-05-05 DIAGNOSIS — M2550 Pain in unspecified joint: Secondary | ICD-10-CM | POA: Diagnosis not present

## 2019-05-05 MED ORDER — TRAMADOL HCL 50 MG PO TABS
50.0000 mg | ORAL_TABLET | Freq: Once | ORAL | Status: DC
  Filled 2019-05-05: qty 1

## 2019-05-05 NOTE — ED Notes (Signed)
Pt awaiting trans

## 2019-05-05 NOTE — ED Provider Notes (Signed)
[facility] Emergency Department Provider Note ____________________________________________  Time seen: Approximately 4:47 PM  I have reviewed the triage vital signs and the nursing notes.   HISTORY  Chief Complaint Fall    HPI Virginia Phillips is a 74 y.o. female with a history of CVA, hypertension, CAD, and Sjogren's who presents to the emergency department for evaluation and treatment of left arm, left lateral ribs, and lower extremity pain and weakness since fall 2 days ago. No alleviating measures prior to arrival.   Past Medical History:  Diagnosis Date  . Coronary artery disease   . GERD (gastroesophageal reflux disease)   . Glaucoma   . Headache    migraines  . History of shingles   . Hypertension   . Pre-diabetes   . Pulmonary embolism on right [facility]) 2007   right lung  . Sjogren's syndrome ([place])   . Stroke [facility]) 1998    Patient Active Problem List   Diagnosis Date Noted  . Impaired fasting glucose 10/23/2018  . Pleurisy 09/26/2018  . Acute non-recurrent pansinusitis 05/20/2018  . Stomach irritation   . Abdominal bloating   . Abnormal upper gastrointestinal barium series   . Benign essential HTN 08/14/2017  . Bradycardia 08/14/2017  . Special screening for malignant neoplasms, colon   . Benign neoplasm of transverse colon   . Diverticulosis of large intestine without diverticulitis   . Cerebrovascular accident (CVA) ([place]) 12/25/2014  . Shingles 12/25/2014  . Acid reflux 08/03/2014  . 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 Cerebral vascular accident ([place]) 08/03/2014  . Allergic rhinitis 07/31/2014  . 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 Atrophic vaginitis 07/31/2014  . Blood pressure elevated without history of HTN 07/31/2014  . Chest pain 07/31/2014  . CN (constipation) 07/31/2014  . Deep vein thrombosis ([place]) 07/31/2014  . Concussion injury of body structure 07/31/2014  . Elevated hemoglobin ([place]) 07/31/2014  . Fatigue 07/31/2014  .  Hemorrhoid 07/31/2014  . Calcium blood increased 07/31/2014  . Hyperlipidemia 07/31/2014  . Blood glucose elevated 07/31/2014  . Leg swelling 07/31/2014  . Lichen sclerosus 47/65/4650  . NASH (nonalcoholic steatohepatitis) 07/31/2014  . Headache, migraine 07/31/2014  . Burning or prickling sensation 07/31/2014  . 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 Lung mass 07/31/2014  . Abdominal pain, right upper quadrant 07/31/2014  . 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 Apnea, sleep 07/31/2014  . Avitaminosis D 07/31/2014  . Cephalalgia 10/21/2013  . Abnormal gait 09/24/2013  . H/O head injury 09/24/2013  . Temporary cerebral vascular dysfunction 11/15/1996    Past Surgical History:  Procedure Laterality Date  . ANKLE FRACTURE SURGERY Right 2007  . COLONOSCOPY WITH PROPOFOL N/A 05/10/2017   Procedure: COLONOSCOPY WITH PROPOFOL;  Surgeon: [name], MD;  Location: [facility] ENDOSCOPY;  Service: Endoscopy;  Laterality: N/A;  . ESOPHAGOGASTRODUODENOSCOPY (EGD) WITH PROPOFOL N/A 10/17/2017   Procedure: ESOPHAGOGASTRODUODENOSCOPY (EGD) WITH PROPOFOL;  Surgeon: [name], MD;  Location: [facility] ENDOSCOPY;  Service: Endoscopy;  Laterality: N/A;  . GANGLION CYST EXCISION Right    wrist  . HERNIA REPAIR     2011  . KNEE ARTHROSCOPY W/ ACL RECONSTRUCTION Right 2008  . KNEE SURGERY  08/05/2007  . LASIK    . NISSEN FUNDOPLICATION  3546    Prior to Admission medications   Medication Sig Start Date End Date Taking? Authorizing Provider  aspirin 81 MG tablet Take 162 mg by mouth daily at 2 PM.     [provider]  aspirin-acetaminophen-caffeine (EXCEDRIN MIGRAINE) 250-250-65 MG tablet Take 2 tablets by mouth daily as needed for headache or migraine.     [provider]  aspirin-sod bicarb-citric acid (ALKA-SELTZER) 325 MG TBEF tablet Take 650 mg by mouth every 6 (six) hours as needed (indigestion).    [provider]  Biotin  (BIOTIN 5000) 5 MG CAPS Take 5,000 mg by mouth daily at 2 PM.    [provider]  BLACK CURRANT SEED OIL PO Take 1 capsule by mouth daily at 2 PM.    [provider]  Cholecalciferol (VITAMIN D) 2000 UNITS CAPS Take 2,000 Units by mouth daily at 2 PM.     [provider]  Coenzyme Q10 (CO Q10) 200 MG CAPS Take 200 mg by mouth daily at 2 PM.     [provider]  ibuprofen (ADVIL,MOTRIN) 200 MG tablet Take 400 mg by mouth daily as needed for headache or moderate pain.     [provider]  Lancets [facility] ULTRASOFT) lancets Check sugar once daily DX 73.9 needs for one touch ultra 2 lancets 10/25/15   Jerrol Banana., MD  lidocaine (LIDODERM) 5 % Place 1 patch onto the skin daily as needed (pain). Remove & Discard patch within 12 hours or as directed by MD    [provider]  Misc Natural Products (PETADOLEX 50) 50 MG CAPS Take 100 mg by mouth 2 (two) times daily at 10 AM and 5 PM.    [provider]  ONE TOUCH ULTRA TEST test strip CHECK BLOOD SUGAR ONCE DAILY AS DIRECTED 11/13/16   Jerrol Banana., MD  polyethylene glycol [facility] [place]) packet Take 17 g by mouth daily.     [provider]  Polyvinyl Alcohol-Povidone (REFRESH OP) Place 1 drop into both eyes daily as needed (dry eyes).    [provider]  pravastatin (PRAVACHOL) 10 MG tablet TAKE 1 TABLET(10 MG) BY MOUTH AT BEDTIME Patient taking differently: Take 10 mg by mouth at bedtime.  08/02/17   Jerrol Banana., MD  pyridOXINE (VITAMIN B-6) 100 MG tablet Take 100 mg by mouth daily at 2 PM.     [provider]  Riboflavin (B2) 100 MG TABS Take 100 mg by mouth 2 (two) times daily.    [provider]  SUPER B COMPLEX/C PO Take 1 tablet by mouth daily at 2 PM.    [provider]  Thiamine HCl (VITAMIN B-1) 250 MG tablet Take 250 mg by mouth daily at 2 PM.    [provider]  valsartan (DIOVAN) 80 MG tablet Take  80 mg by mouth daily at 2 PM.  09/10/17   [provider]  venlafaxine XR (EFFEXOR-XR) 75 MG 24 hr capsule Take 75 mg by mouth daily at 2 PM.    [provider]  vitamin B-12 (CYANOCOBALAMIN) 1000 MCG tablet Take 1,000 mcg by mouth daily at 2 PM.    [provider]  vitamin C (ASCORBIC ACID) 500 MG tablet Take 500 mg by mouth daily at 2 PM.    [provider]    Allergies Amoxicillin, Blue dyes (parenteral), Butalbital-aspirin-caffeine, Clindamycin/lincomycin, Clobetasol, Cortisone, Dexlansoprazole, Diphenhydramine, Doxycycline, Esomeprazole, Gabapentin, Green dye, Iodinated diagnostic agents, Meloxicam, Metronidazole, Nortriptyline hcl, Prednisolone acetate, Tetracycline, Baby oil, Levofloxacin, Mineral oil, Prednisone, and Sulfa antibiotics  Family History  Problem Relation Age of Onset  . Congestive Heart Failure Mother   . Parkinson's disease Mother   . Diabetes Father   .  Scleroderma Sister   . Cancer Brother     Social History Social History   Tobacco Use  . Smoking status: Never Smoker  . Smokeless tobacco: Never Used  Substance Use Topics  . Alcohol use: No    Alcohol/week: 0.0 standard drinks  . Drug use: No    Review of Systems Constitutional: Negative for fever. Cardiovascular: Negative for chest pain. Respiratory: Negative for shortness of breath. Musculoskeletal: Positive for left upper and lower extremity pain. Positive for right lower extremity pain. Skin: No open wound or lesions. Positive for ecchymosis of the dorsum of the left hand and volar aspect of the distal forearm.  Neurological: Negative for decrease in sensation  ____________________________________________   PHYSICAL EXAM:  VITAL SIGNS: ED Triage Vitals  Enc Vitals Group     BP 05/05/19 1606 137/82     Pulse Rate 05/05/19 1606 82     Resp 05/05/19 1606 16     Temp 05/05/19 1606 98.8 F (37.1 C)     Temp Source 05/05/19 1606 Oral     SpO2 05/05/19 1606  95 %     Weight 05/05/19 1607 185 lb (83.9 kg)     Height 05/05/19 1607 5' 6"  (1.676 m)     Head Circumference --      Peak Flow --      Pain Score 05/05/19 1607 0     Pain Loc --      Pain Edu? --      Excl. in [place]? --     Constitutional: Alert and oriented. Well appearing and in no acute distress. Eyes: Conjunctivae are clear without discharge or drainage Head: Atraumatic Neck: No focal midline tenderness. Respiratory: No cough. Respirations are even and unlabored. Musculoskeletal: Diffuse left upper arm pain, diffuse left distal forearm/wrist pain and left hand pain. Bilateral pretibial tenderness. Able to perform straight leg raise bilaterally. Demonstrates flexion and extension of knee/hip bilaterally.  Neurologic: Motor and sensory function intact.   Skin: Ecchymosis over dorsal aspect of the left hand and volar aspect of the left wrist/forearm.  Psychiatric: Affect and behavior are appropriate.  ____________________________________________   LABS (all labs ordered are listed, but only abnormal results are displayed)  Labs Reviewed - No data to display ____________________________________________  RADIOLOGY  Nondisplaced intra-articular left distal radius fracture.  I, Sherrie George, personally viewed and evaluated these images (plain radiographs) as part of my medical decision making, as well as reviewing the written report by the radiologist.  DG Forearm Left  Result Date: 05/05/2019 CLINICAL DATA:  Pain post fall EXAM: LEFT FOREARM - 2 VIEW COMPARISON:  None. FINDINGS: Probable acute nondisplaced, slightly impacted intra-articular distal radius fracture. No significant elbow effusion. Radial head alignment is normal. IMPRESSION: Findings suspicious for acute nondisplaced intra-articular distal radius fracture Electronically Signed   By: Donavan Foil M.D.   On: 05/05/2019 17:51   DG Tibia/Fibula Left  Result Date: 05/05/2019 CLINICAL DATA:  Pain post fall EXAM: LEFT  TIBIA AND FIBULA - 2 VIEW COMPARISON:  None. FINDINGS: No fracture or malalignment.  Soft tissues are unremarkable. IMPRESSION: Negative. Electronically Signed   By: Donavan Foil M.D.   On: 05/05/2019 17:54   DG Tibia/Fibula Right  Result Date: 05/05/2019 CLINICAL DATA:  Pain post fall EXAM: RIGHT TIBIA AND FIBULA - 2 VIEW COMPARISON:  01/03/2006 FINDINGS: No fracture or malalignment. Screw fixation of the medial malleolus. Status post surgical plate and multiple fixating screws at the distal tibia. The 2 distal most fixating screws at  the fibula do not appear as closely opposed to the fixating plate compared to previous, suggesting possible loosening. IMPRESSION: 1. No acute osseous abnormality. 2. Postsurgical changes of the distal tibia and fibula. Possible loosening at the 2 distal most fixating screws of the fibula. Electronically Signed   By: Donavan Foil M.D.   On: 05/05/2019 17:58   DG Shoulder Left  Result Date: 05/05/2019 CLINICAL DATA:  Pain post fall EXAM: LEFT SHOULDER - 2+ VIEW COMPARISON:  None. FINDINGS: No acute displaced fracture or malalignment. Moderate AC joint and glenohumeral degenerative change. Mild calcific tendinitis. Slightly high-riding appearance of humeral head. IMPRESSION: 1. No acute fracture or dislocation 2. Degenerative changes of the [facility] joint and shoulder. Slightly high-riding appearance of the humeral head as may be seen with rotator cuff disease. Electronically Signed   By: Donavan Foil M.D.   On: 05/05/2019 17:53   DG Hand Complete Left  Result Date: 05/05/2019 CLINICAL DATA:  Pain post fall EXAM: LEFT HAND - COMPLETE 3+ VIEW COMPARISON:  None. FINDINGS: Probable acute nondisplaced distal radius fracture with suspected intra-articular component. No subluxation. Soft tissue swelling at the wrist. IMPRESSION: Findings suspicious for nondisplaced intra-articular distal radius fracture Electronically Signed   By: Donavan Foil M.D.   On: 05/05/2019 17:49    ____________________________________________   PROCEDURES  Procedures  ____________________________________________   INITIAL IMPRESSION / ASSESSMENT AND PLAN / ED COURSE  Virginia Phillips is a 74 y.o. who presents to the emergency department for treatment and evaluation after a fall on Saturday.  See HPI for further details.  Patient is able to demonstrate range of motion of extremities.  She does have ecchymosis and some mild swelling to the left hand and wrist.  Plan will be to get x-rays of the left upper extremity and bilateral lower extremities.  Images and exam are consistent.  She does have a possible nondisplaced intra-articular distal radius fracture.  Volar OCL to be applied.  Imaging is otherwise reassuring.  She has no red flags of back pain.  Patient instructed to follow-up with orthopedics.  She was also instructed to return to the emergency department for symptoms that change or worsen if unable schedule an appointment with orthopedics or primary care.  Medications  traMADol (ULTRAM) tablet 50 mg (50 mg Oral Not Given 05/05/19 1843)    Pertinent labs & imaging results that were available during my care of the patient were reviewed by me and considered in my medical decision making (see chart for details).   _________________________________________   FINAL CLINICAL IMPRESSION(S) / ED DIAGNOSES  Final diagnoses:  Nondisplaced fracture of distal end of left radius  Musculoskeletal pain of extremity    ED Discharge Orders    None       If controlled substance prescribed during this visit, 12 month history viewed on the [place] prior to issuing an initial prescription for Schedule II or III opiod.   Victorino Dike, FNP 05/05/19 2204    Duffy Bruce, MD 05/06/19 1504

## 2019-05-05 NOTE — ED Notes (Signed)
First Nurse Note:  Pt in via [facility] from home, reports mecanical fall on Saturday, complains today of left wrist and left leg pain.  Per EMS, vitals WDL.  NAD noted upon arrival.

## 2019-05-05 NOTE — Telephone Encounter (Signed)
Pt called stating that she had a fall on Saturday and missed a step and fell on her left side on concrete. Pt states she hurt her wrist, but swelling has gone down and is no longer purple, but part of her hand is blue. Pt states also that her left shoulder is giving her pain and when she breathes the area by her left breast hurts. Pt states she is not able to stand up for no longer than 30 seconds and feels like her legs are giving out.  Spoke with heather and advised pt that she needs to go to the er as soon as she can so they can do further testing.

## 2019-05-05 NOTE — ED Triage Notes (Addendum)
Patient from home via [facility]. Reports on Saturday, she missed a stair and fell. Denies hitting head. Reports pain in left arm and hand, left side, and both legs in the shin area. Swelling and bruising noted to left hand. Patient states can't bear weight on legs due to pain so was instructed by PCP to come to ED for evaluation.

## 2019-05-05 NOTE — ED Notes (Signed)
See triage note  States she had a fall on Saturday having pain to left wrist/forearm,shoulder,lateral ribs  And weakness to both legs

## 2019-05-05 NOTE — Discharge Instructions (Signed)
Please call and schedule an appointment with orthopedics.  Continue your ibuprofen and tylenol.  Return to the ER for symptoms that change or worsen or for new concerns if unable to see primary care or orthopedics.

## 2019-05-06 ENCOUNTER — Telehealth: Payer: Self-pay

## 2019-05-06 NOTE — Telephone Encounter (Signed)
Spoke with patient after ER visit from a fall at home, I called Ortho at [facility] and they will pull hospital records and call pt with an appointment. Patient has been advised and will call back if she needs anything further. Virginia Phillips

## 2019-05-07 ENCOUNTER — Other Ambulatory Visit: Payer: Self-pay | Admitting: Nurse Practitioner

## 2019-05-07 ENCOUNTER — Telehealth: Payer: Self-pay

## 2019-05-07 DIAGNOSIS — M79602 Pain in left arm: Secondary | ICD-10-CM

## 2019-05-07 DIAGNOSIS — S52502A Unspecified fracture of the lower end of left radius, initial encounter for closed fracture: Secondary | ICD-10-CM

## 2019-05-07 MED ORDER — TRAMADOL HCL 50 MG PO TABS: 50.0000 mg | ORAL_TABLET | Freq: Four times a day (QID) | ORAL | 0 refills | Status: DC | PRN

## 2019-05-07 NOTE — Progress Notes (Signed)
Patient calling office several times regarding pain suffered after fall at home. She was seen in ER. X-rays were done of left hand, left forearm, left shoulder, biltareral tib/fib. X-rays suspicious for nondisplaced fracture of left distal radius with intra-articular component. Was told by Er to take tylenol/ibuprofen for pain. Not helping at all. Sent prescription for tramadol 29m which may be taken up to four times daily as needed for pain. She does have appointment with orthopedics on Friday, 05/09/2019 for further evaluation and treatment.

## 2019-05-07 NOTE — Telephone Encounter (Signed)
Pt called that she having terrible pain in left side of ribs and breast due to the fall as per heather she send tramadol few tabs and she need to keep appt with orthopedic

## 2019-05-09 DIAGNOSIS — S52572A Other intraarticular fracture of lower end of left radius, initial encounter for closed fracture: Secondary | ICD-10-CM | POA: Diagnosis not present

## 2019-05-09 DIAGNOSIS — W010XXA Fall on same level from slipping, tripping and stumbling without subsequent striking against object, initial encounter: Secondary | ICD-10-CM | POA: Diagnosis not present

## 2019-05-09 DIAGNOSIS — M25562 Pain in left knee: Secondary | ICD-10-CM | POA: Diagnosis not present

## 2019-05-09 DIAGNOSIS — M25532 Pain in left wrist: Secondary | ICD-10-CM | POA: Diagnosis not present

## 2019-05-09 DIAGNOSIS — R29898 Other symptoms and signs involving the musculoskeletal system: Secondary | ICD-10-CM | POA: Diagnosis not present

## 2019-05-13 ENCOUNTER — Other Ambulatory Visit: Payer: Self-pay | Admitting: Orthopedic Surgery

## 2019-05-13 DIAGNOSIS — R29898 Other symptoms and signs involving the musculoskeletal system: Secondary | ICD-10-CM

## 2019-05-21 DIAGNOSIS — M79604 Pain in right leg: Secondary | ICD-10-CM | POA: Diagnosis not present

## 2019-05-21 DIAGNOSIS — M25552 Pain in left hip: Secondary | ICD-10-CM | POA: Diagnosis not present

## 2019-05-21 DIAGNOSIS — R202 Paresthesia of skin: Secondary | ICD-10-CM | POA: Diagnosis not present

## 2019-05-21 DIAGNOSIS — R200 Anesthesia of skin: Secondary | ICD-10-CM | POA: Diagnosis not present

## 2019-05-21 DIAGNOSIS — M5126 Other intervertebral disc displacement, lumbar region: Secondary | ICD-10-CM | POA: Diagnosis not present

## 2019-05-21 DIAGNOSIS — M79605 Pain in left leg: Secondary | ICD-10-CM | POA: Diagnosis not present

## 2019-05-21 DIAGNOSIS — M4316 Spondylolisthesis, lumbar region: Secondary | ICD-10-CM | POA: Diagnosis not present

## 2019-05-21 DIAGNOSIS — M7138 Other bursal cyst, other site: Secondary | ICD-10-CM | POA: Diagnosis not present

## 2019-05-29 DIAGNOSIS — S52572D Other intraarticular fracture of lower end of left radius, subsequent encounter for closed fracture with routine healing: Secondary | ICD-10-CM | POA: Diagnosis not present

## 2019-05-29 DIAGNOSIS — Z86718 Personal history of other venous thrombosis and embolism: Secondary | ICD-10-CM | POA: Diagnosis not present

## 2019-05-29 DIAGNOSIS — S52572A Other intraarticular fracture of lower end of left radius, initial encounter for closed fracture: Secondary | ICD-10-CM | POA: Diagnosis not present

## 2019-05-29 DIAGNOSIS — K7689 Other specified diseases of liver: Secondary | ICD-10-CM | POA: Diagnosis not present

## 2019-05-29 DIAGNOSIS — M2040 Other hammer toe(s) (acquired), unspecified foot: Secondary | ICD-10-CM | POA: Diagnosis not present

## 2019-05-29 DIAGNOSIS — G4730 Sleep apnea, unspecified: Secondary | ICD-10-CM | POA: Diagnosis not present

## 2019-05-29 DIAGNOSIS — K219 Gastro-esophageal reflux disease without esophagitis: Secondary | ICD-10-CM | POA: Diagnosis not present

## 2019-05-29 DIAGNOSIS — G43909 Migraine, unspecified, not intractable, without status migrainosus: Secondary | ICD-10-CM | POA: Diagnosis not present

## 2019-05-29 DIAGNOSIS — Z7982 Long term (current) use of aspirin: Secondary | ICD-10-CM | POA: Diagnosis not present

## 2019-05-29 DIAGNOSIS — E7800 Pure hypercholesterolemia, unspecified: Secondary | ICD-10-CM | POA: Diagnosis not present

## 2019-05-29 DIAGNOSIS — E559 Vitamin D deficiency, unspecified: Secondary | ICD-10-CM | POA: Diagnosis not present

## 2019-05-29 DIAGNOSIS — H409 Unspecified glaucoma: Secondary | ICD-10-CM | POA: Diagnosis not present

## 2019-05-29 DIAGNOSIS — Z86711 Personal history of pulmonary embolism: Secondary | ICD-10-CM | POA: Diagnosis not present

## 2019-05-29 DIAGNOSIS — Z8673 Personal history of transient ischemic attack (TIA), and cerebral infarction without residual deficits: Secondary | ICD-10-CM | POA: Diagnosis not present

## 2019-06-03 DIAGNOSIS — S52572A Other intraarticular fracture of lower end of left radius, initial encounter for closed fracture: Secondary | ICD-10-CM | POA: Diagnosis not present

## 2019-06-03 DIAGNOSIS — R29898 Other symptoms and signs involving the musculoskeletal system: Secondary | ICD-10-CM | POA: Diagnosis not present

## 2019-06-04 DIAGNOSIS — S52572D Other intraarticular fracture of lower end of left radius, subsequent encounter for closed fracture with routine healing: Secondary | ICD-10-CM | POA: Diagnosis not present

## 2019-06-04 DIAGNOSIS — H409 Unspecified glaucoma: Secondary | ICD-10-CM | POA: Diagnosis not present

## 2019-06-04 DIAGNOSIS — G43909 Migraine, unspecified, not intractable, without status migrainosus: Secondary | ICD-10-CM | POA: Diagnosis not present

## 2019-06-04 DIAGNOSIS — G4730 Sleep apnea, unspecified: Secondary | ICD-10-CM | POA: Diagnosis not present

## 2019-06-04 DIAGNOSIS — E7800 Pure hypercholesterolemia, unspecified: Secondary | ICD-10-CM | POA: Diagnosis not present

## 2019-06-04 DIAGNOSIS — M2040 Other hammer toe(s) (acquired), unspecified foot: Secondary | ICD-10-CM | POA: Diagnosis not present

## 2019-06-11 ENCOUNTER — Other Ambulatory Visit: Payer: Medicare Other

## 2019-06-12 DIAGNOSIS — M25532 Pain in left wrist: Secondary | ICD-10-CM | POA: Diagnosis not present

## 2019-06-12 DIAGNOSIS — S52502D Unspecified fracture of the lower end of left radius, subsequent encounter for closed fracture with routine healing: Secondary | ICD-10-CM | POA: Diagnosis not present

## 2019-06-12 DIAGNOSIS — R29898 Other symptoms and signs involving the musculoskeletal system: Secondary | ICD-10-CM | POA: Diagnosis not present

## 2019-06-24 ENCOUNTER — Ambulatory Visit
Admission: RE | Admit: 2019-06-24 | Discharge: 2019-06-24 | Disposition: A | Discharge status: Home / Self Care | Payer: Medicare Other | Source: Ambulatory Visit | Attending: Nurse Practitioner | Admitting: Nurse Practitioner

## 2019-06-24 ENCOUNTER — Ambulatory Visit (INDEPENDENT_AMBULATORY_CARE_PROVIDER_SITE_OTHER): Payer: Medicare Other | Admitting: Nurse Practitioner

## 2019-06-24 ENCOUNTER — Other Ambulatory Visit: Payer: Self-pay | Admitting: Nurse Practitioner

## 2019-06-24 ENCOUNTER — Other Ambulatory Visit: Payer: Self-pay

## 2019-06-24 ENCOUNTER — Encounter: Payer: Self-pay | Admitting: Nurse Practitioner

## 2019-06-24 VITALS — BP 139/69 | HR 85 | Temp 97.3°F | Resp 16 | Ht 66.0 in | Wt 185.8 lb

## 2019-06-24 DIAGNOSIS — I10 Essential (primary) hypertension: Secondary | ICD-10-CM | POA: Diagnosis not present

## 2019-06-24 DIAGNOSIS — R0781 Pleurodynia: Secondary | ICD-10-CM

## 2019-06-24 DIAGNOSIS — M79602 Pain in left arm: Secondary | ICD-10-CM

## 2019-06-24 DIAGNOSIS — S299XXA Unspecified injury of thorax, initial encounter: Secondary | ICD-10-CM | POA: Diagnosis not present

## 2019-06-24 DIAGNOSIS — S52502A Unspecified fracture of the lower end of left radius, initial encounter for closed fracture: Secondary | ICD-10-CM

## 2019-06-24 MED ORDER — TRAMADOL HCL 50 MG PO TABS: 50.0000 mg | ORAL_TABLET | Freq: Four times a day (QID) | ORAL | 1 refills | Status: DC | PRN

## 2019-06-24 NOTE — Progress Notes (Signed)
[facility] [place], [place] [address]  Internal MEDICINE  Office Visit Note  Patient Name: Virginia Phillips  [id number]  [id number]  Date of Service: 07/08/2019  Chief Complaint  Patient presents with  . Hospitalization Follow-up    fracture    The patient is here for follow up visit. She had fall at her daughter's home in the beginning of March. She had multiple x-rays. She did have distal radial fracture with intraarticular involvement. She has seen orthopedics. Has been released from orthopedics care. She will start physical therapy for this on Monday. Will still need to have physical therapist for the weakness and pain in her lower legs. This will start as soon as a slot becomes available. She does have good ROM with intact fine motor movement. Hurts to move the wrist and fingers. Her gait is good. She does use cane when she will be walking for long periods of time. She does continue to have residual pain. Mostly takes tylenol and motrin to help. This does help most times, but sometimes needs a little more than that. She did have prescription for tramadol, which she was given when injury first occurred. She may need to have refill for this today.  She does have rib cage pain. Started on left side, but has started to hurt on both sides. She never did have x-ray of the ribs. We discussed that, in general, there is no treatment other than time for fractured ribs. She voices understanding, but would feel better knowing.  She also has a massage therapist who she sees a few times a month. This does help her pain in the ligaments and muscles as well.       Current Medication: Outpatient Encounter Medications as of 06/24/2019  Medication Sig  . aspirin 81 MG tablet Take 162 mg by mouth daily at 2 PM.   . aspirin-acetaminophen-caffeine (EXCEDRIN MIGRAINE) 250-250-65 MG tablet Take 2 tablets by mouth daily as needed for headache or migraine.   Marland Kitchen aspirin-sod bicarb-citric  acid (ALKA-SELTZER) 325 MG TBEF tablet Take 650 mg by mouth every 6 (six) hours as needed (indigestion).  . Biotin (BIOTIN 5000) 5 MG CAPS Take 5,000 mg by mouth daily at 2 PM.  . BLACK CURRANT SEED OIL PO Take 1 capsule by mouth daily at 2 PM.  . Cholecalciferol (VITAMIN D) 2000 UNITS CAPS Take 2,000 Units by mouth daily at 2 PM.   . Coenzyme Q10 (CO Q10) 200 MG CAPS Take 200 mg by mouth daily at 2 PM.   . ibuprofen (ADVIL,MOTRIN) 200 MG tablet Take 400 mg by mouth daily as needed for headache or moderate pain.   . Lancets (ONETOUCH ULTRASOFT) lancets Check sugar once daily DX 73.9 needs for one touch ultra 2 lancets  . lidocaine (LIDODERM) 5 % Place 1 patch onto the skin daily as needed (pain). Remove & Discard patch within 12 hours or as directed by MD  . Misc Natural Products (PETADOLEX 50) 50 MG CAPS Take 100 mg by mouth 2 (two) times daily at 10 AM and 5 PM.  . ONE TOUCH ULTRA TEST test strip CHECK BLOOD SUGAR ONCE DAILY AS DIRECTED  . polyethylene glycol (MIRALAX / GLYCOLAX) packet Take 17 g by mouth daily.   . Polyvinyl Alcohol-Povidone (REFRESH OP) Place 1 drop into both eyes daily as needed (dry eyes).  . pravastatin (PRAVACHOL) 10 MG tablet TAKE 1 TABLET(10 MG) BY MOUTH AT BEDTIME (Patient taking differently: Take 10 mg by mouth  at bedtime. )  . pyridOXINE (VITAMIN B-6) 100 MG tablet Take 100 mg by mouth daily at 2 PM.   . Riboflavin (B2) 100 MG TABS Take 100 mg by mouth 2 (two) times daily.  . SUPER B COMPLEX/C PO Take 1 tablet by mouth daily at 2 PM.  . Thiamine HCl (VITAMIN B-1) 250 MG tablet Take 250 mg by mouth daily at 2 PM.  . traMADol (ULTRAM) 50 MG tablet Take 1 tablet (50 mg total) by mouth every 6 (six) hours as needed.  . valsartan (DIOVAN) 80 MG tablet Take 80 mg by mouth daily at 2 PM.   . venlafaxine XR (EFFEXOR-XR) 75 MG 24 hr capsule Take 75 mg by mouth daily at 2 PM.  . vitamin B-12 (CYANOCOBALAMIN) 1000 MCG tablet Take 1,000 mcg by mouth daily at 2 PM.  . vitamin C  (ASCORBIC ACID) 500 MG tablet Take 500 mg by mouth daily at 2 PM.  . [DISCONTINUED] traMADol (ULTRAM) 50 MG tablet Take 1 tablet (50 mg total) by mouth every 6 (six) hours as needed.   No facility-administered encounter medications on file as of 06/24/2019.    Surgical History: Past Surgical History:  Procedure Laterality Date  . ANKLE FRACTURE SURGERY Right 2007  . COLONOSCOPY WITH PROPOFOL N/A 05/10/2017   Procedure: COLONOSCOPY WITH PROPOFOL;  Surgeon: [name], MD;  Location: [facility] ENDOSCOPY;  Service: Endoscopy;  Laterality: N/A;  . ESOPHAGOGASTRODUODENOSCOPY (EGD) WITH PROPOFOL N/A 10/17/2017   Procedure: ESOPHAGOGASTRODUODENOSCOPY (EGD) WITH PROPOFOL;  Surgeon: [name], MD;  Location: [facility] ENDOSCOPY;  Service: Endoscopy;  Laterality: N/A;  . GANGLION CYST EXCISION Right    wrist  . HERNIA REPAIR     2011  . KNEE ARTHROSCOPY W/ ACL RECONSTRUCTION Right 2008  . KNEE SURGERY  08/05/2007  . LASIK    . NISSEN FUNDOPLICATION  1937    Medical History: Past Medical History:  Diagnosis Date  . Coronary artery disease   . GERD (gastroesophageal reflux disease)   . Glaucoma   . Headache    migraines  . History of shingles   . Hypertension   . Pre-diabetes   . Pulmonary embolism on right [facility]) 2007   right lung  . Sjogren's syndrome ([place])   . Stroke [facility]) 1998    Family History: Family History  Problem Relation Age of Onset  . Congestive Heart Failure Mother   . Parkinson's disease Mother   . Diabetes Father   . Scleroderma Sister   . Cancer Brother     Social History   Socioeconomic History  . Marital status: Married    Spouse name: Not on file  . Number of children: 1  . Years of education: Not on file  . Highest education level: Some college, no degree  Occupational History  . Occupation: retired  Tobacco Use  . Smoking status: Never Smoker  . Smokeless tobacco: Never Used  Substance and Sexual Activity  . Alcohol use: No     Alcohol/week: 0.0 standard drinks  . Drug use: No  . Sexual activity: Not on file  Other Topics Concern  . Not on file  Social History Narrative  . Not on file   Social Determinants of Health   Financial Resource Strain:   . Difficulty of Paying Living Expenses:   Food Insecurity:   . Worried About Charity fundraiser in the Last Year:   . Arboriculturist in the Last Year:   Transportation Needs:   .  Lack of Transportation (Medical):   Marland Kitchen Lack of Transportation (Non-Medical):   Physical Activity:   . Days of Exercise per Week:   . Minutes of Exercise per Session:   Stress:   . Feeling of Stress :   Social Connections:   . Frequency of Communication with Friends and Family:   . Frequency of Social Gatherings with Friends and Family:   . Attends Religious Services:   . Active Member of Clubs or Organizations:   . Attends Archivist Meetings:   Marland Kitchen Marital Status:   Intimate Partner Violence:   . Fear of Current or Ex-Partner:   . Emotionally Abused:   Marland Kitchen Physically Abused:   . Sexually Abused:       Review of Systems  Constitutional: Positive for activity change. Negative for chills, fatigue and unexpected weight change.  HENT: Negative for congestion, postnasal drip, rhinorrhea, sneezing and sore throat.   Respiratory: Negative for cough, chest tightness, shortness of breath and wheezing.   Cardiovascular: Negative for chest pain and palpitations.  Gastrointestinal: Negative for abdominal pain, constipation, diarrhea, nausea and vomiting.  Endocrine: Negative for cold intolerance, heat intolerance, polydipsia and polyuria.  Musculoskeletal: Positive for arthralgias, gait problem and myalgias. Negative for back pain, joint swelling and neck pain.       The patient has pain in left wrist, both lower legs and in rib cage area after fall she had in March.   Skin: Negative for rash.  Allergic/Immunologic: Negative for environmental allergies.  Neurological: Negative  for dizziness, tremors, numbness and headaches.  Hematological: Negative for adenopathy. Does not bruise/bleed easily.  Psychiatric/Behavioral: Negative for behavioral problems (Depression), sleep disturbance and suicidal ideas. The patient is not nervous/anxious.     Today's Vitals   06/24/19 1106  BP: 139/69  Pulse: 85  Resp: 16  Temp: (!) 97.3 F (36.3 C)  SpO2: 97%  Weight: 185 lb 12.8 oz (84.3 kg)  Height: 5' 6"  (1.676 m)   Body mass index is 29.99 kg/m.  Physical Exam Vitals and nursing note reviewed.  Constitutional:      General: She is not in acute distress.    Appearance: Normal appearance. She is well-developed. She is not diaphoretic.  HENT:     Head: Normocephalic and atraumatic.     Mouth/Throat:     Pharynx: No oropharyngeal exudate.  Eyes:     Pupils: Pupils are equal, round, and reactive to light.  Neck:     Thyroid: No thyromegaly.     Vascular: No JVD.     Trachea: No tracheal deviation.  Cardiovascular:     Rate and Rhythm: Normal rate and regular rhythm.     Heart sounds: Normal heart sounds. No murmur. No friction rub. No gallop.   Pulmonary:     Effort: Pulmonary effort is normal. No respiratory distress.     Breath sounds: Normal breath sounds. No wheezing or rales.  Chest:     Chest wall: No tenderness.  Abdominal:     General: Bowel sounds are normal.     Palpations: Abdomen is soft.  Musculoskeletal:        General: Normal range of motion.     Cervical back: Normal range of motion and neck supple.     Comments: There is left wrist pain, causing reduced ROM and strength in the left arm. She also has pain with palpation of the bilateral rib cage. No bruising or swelling present. No crepitus felt with moderate palpation.   Lymphadenopathy:  Cervical: No cervical adenopathy.  Skin:    General: Skin is warm and dry.  Neurological:     Mental Status: She is alert and oriented to person, place, and time.     Cranial Nerves: No cranial  nerve deficit.  Psychiatric:        Mood and Affect: Mood normal.        Behavior: Behavior normal.        Thought Content: Thought content normal.        Judgment: Judgment normal.    Assessment/Plan: 1. Pain in rib Persistent after fall in March, 2021. Will get x-ray of rib cage for further evaluation.  - DG Ribs Bilateral; Future  2. Pain in left arm Short term prescription for tramadol given to patient. May take up to four times daily as needed for pain. Prescription for #30 tablets was sent to her pharmacy . - traMADol (ULTRAM) 50 MG tablet; Take 1 tablet (50 mg total) by mouth every 6 (six) hours as needed.  Dispense: 30 tablet; Refill: 1  3. Nondisplaced fracture of distal end of left radius Short term prescription for tramadol given to patient. May take up to four times daily as needed for pain. Prescription for #30 tablets was sent to her pharmacy . - traMADol (ULTRAM) 50 MG tablet; Take 1 tablet (50 mg total) by mouth every 6 (six) hours as needed.  Dispense: 30 tablet; Refill: 1  4. Benign essential HTN Stable. Continue bp medication as prescribed   General Counseling: Tien verbalizes understanding of the findings of todays visit and agrees with plan of treatment. I have discussed any further diagnostic evaluation that may be needed or ordered today. We also reviewed her medications today. she has been encouraged to call the office with any questions or concerns that should arise related to todays visit.  This patient was seen by Leretha Pol FNP Collaboration with Dr Lavera Guise as a part of collaborative care agreement  Orders Placed This Encounter  Procedures  . DG Ribs Bilateral    Meds ordered this encounter  Medications  . traMADol (ULTRAM) 50 MG tablet    Sig: Take 1 tablet (50 mg total) by mouth every 6 (six) hours as needed.    Dispense:  30 tablet    Refill:  1    Order Specific Question:   Supervising Provider    Answer:   Lavera Guise [[id number]]     Total time spent: 30 Minutes  Time spent includes review of chart, medications, test results, and follow up plan with the patient.      Dr Lavera Guise Internal medicine

## 2019-06-24 NOTE — Progress Notes (Signed)
Changed x-ray orders in Epic for xrays of right and left rib cage.

## 2019-06-25 NOTE — Progress Notes (Signed)
Normal x-ray

## 2019-06-25 NOTE — Progress Notes (Signed)
Hey. Can you let the patient know that x-rays of her ribs were normal. Thanks.

## 2019-06-26 ENCOUNTER — Telehealth: Payer: Self-pay

## 2019-06-26 NOTE — Telephone Encounter (Signed)
-----   Message from Ronnell Freshwater, NP sent at 06/25/2019  3:44 PM EDT ----- Marykay Lex. Can you let the patient know that x-rays of her ribs were normal. Thanks.

## 2019-06-26 NOTE — Progress Notes (Signed)
Lmom to call us back

## 2019-06-26 NOTE — Telephone Encounter (Signed)
Pt.notified

## 2019-06-28 DIAGNOSIS — S52572A Other intraarticular fracture of lower end of left radius, initial encounter for closed fracture: Secondary | ICD-10-CM | POA: Diagnosis not present

## 2019-06-30 ENCOUNTER — Ambulatory Visit: Payer: Medicare Other | Attending: Orthopedic Surgery | Admitting: Occupational Therapy

## 2019-06-30 ENCOUNTER — Encounter: Payer: Self-pay | Admitting: Occupational Therapy

## 2019-06-30 ENCOUNTER — Other Ambulatory Visit: Payer: Self-pay

## 2019-06-30 DIAGNOSIS — M25632 Stiffness of left wrist, not elsewhere classified: Secondary | ICD-10-CM | POA: Diagnosis not present

## 2019-06-30 DIAGNOSIS — M6281 Muscle weakness (generalized): Secondary | ICD-10-CM | POA: Insufficient documentation

## 2019-06-30 DIAGNOSIS — M25532 Pain in left wrist: Secondary | ICD-10-CM | POA: Insufficient documentation

## 2019-06-30 NOTE — Therapy (Signed)
[facility] [address] [place], [address] Phone: [phone]   Fax:  [phone]  Occupational Therapy Evaluation  Patient Details  Name: Virginia Phillips MRN: [id number] Date of Birth: 1945/12/04 Referring Provider (OT): Tamala Julian PA   Encounter Date: 06/30/2019  OT End of Session - 06/30/19 1454    Visit Number  1    Number of Visits  4    Date for OT Re-Evaluation  07/28/19    OT Start Time  1346    OT Stop Time  1443    OT Time Calculation (min)  57 min    Activity Tolerance  Patient tolerated treatment well    Behavior During Therapy  [facility] for tasks assessed/performed       Past Medical History:  Diagnosis Date  . Coronary artery disease   . GERD (gastroesophageal reflux disease)   . Glaucoma   . Headache    migraines  . History of shingles   . Hypertension   . Pre-diabetes   . Pulmonary embolism on right [facility]) 2007   right lung  . Sjogren's syndrome ([place])   . Stroke [facility] 1998    Past Surgical History:  Procedure Laterality Date  . ANKLE FRACTURE SURGERY Right 2007  . COLONOSCOPY WITH PROPOFOL N/A 05/10/2017   Procedure: COLONOSCOPY WITH PROPOFOL;  Surgeon: [name], MD;  Location: [facility] ENDOSCOPY;  Service: Endoscopy;  Laterality: N/A;  . ESOPHAGOGASTRODUODENOSCOPY (EGD) WITH PROPOFOL N/A 10/17/2017   Procedure: ESOPHAGOGASTRODUODENOSCOPY (EGD) WITH PROPOFOL;  Surgeon: [name], MD;  Location: [facility] ENDOSCOPY;  Service: Endoscopy;  Laterality: N/A;  . GANGLION CYST EXCISION Right    wrist  . HERNIA REPAIR     2011  . KNEE ARTHROSCOPY W/ ACL RECONSTRUCTION Right 2008  . KNEE SURGERY  08/05/2007  . LASIK    . NISSEN FUNDOPLICATION  3154    There were no vitals filed for this visit.  Subjective Assessment - 06/30/19 1446    Subjective   I think my wrist is better than it was -and I don't think I need therapy for my legs- I had only that fall with my wrist and  then 3 wks ago when I got out of the chair to quick and my leg was asleep - did not had my cane with me    Pertinent History  Pt fell at her daughters front stairs and ended with closed distal radius fx L - was in cast and brace -and starting taking it off about week ago - only wearing it when out and about - still stiff and feel week -and some pain at the base of thumb reported    Patient Stated Goals  I want to be able to use my L hand and wrist like before - get my motion and strength back to push up , lift pots in kitchen, carry/grip things and open jars    Currently in Pain?  No/denies        [facility] OT Assessment - 06/30/19 0001      Assessment   Medical Diagnosis  L closed distal radius fx     Referring Provider (OT)  Tamala Julian PA    Onset Date/Surgical Date  05/03/19    Hand Dominance  Right      Balance Screen   Has the patient fallen in the past 6 months  Yes    How many times?  1    Has the  patient had a decrease in activity level because of a fear of falling?   No    Is the patient reluctant to leave their home because of a fear of falling?   No      Home  Environment   Lives With  Spouse      Prior Function   Vocation  Retired    Leisure  read, Oceanographer, laundry and cooking       AROM   Right Wrist Extension  64 Degrees    Right Wrist Flexion  85 Degrees    Right Wrist Radial Deviation  24 Degrees    Right Wrist Ulnar Deviation  25 Degrees    Left Wrist Extension  55 Degrees    Left Wrist Flexion  44 Degrees    Left Wrist Radial Deviation  14 Degrees    Left Wrist Ulnar Deviation  20 Degrees      Strength   Right Hand Grip (lbs)  45    Right Hand Lateral Pinch  14 lbs    Right Hand 3 Point Pinch  17 lbs    Left Hand Grip (lbs)  24    Left Hand Lateral Pinch  9 lbs    Left Hand 3 Point Pinch  9 lbs       fluidotherapy done prior to HEP review - to decrease stiffness and increase motion    HEP and hand out provided - review  Heat  PROM and AAROM for  wrist flexion ,ext, RD, UD  10 reps  2 lbs weight pain free for wrist sup/pro, RD,UD  10 reps  in 3 days if pain free  2 sets  And all 2 x day             OT Education - 06/30/19 1454    Education Details  findings of eval and HEP    Person(s) Educated  Patient    Methods  Explanation;Demonstration;Tactile cues;Verbal cues;Handout    Comprehension  Returned demonstration;Verbalized understanding       OT Short Term Goals - 06/30/19 1459      OT SHORT TERM GOAL #1   Title  Pt to be independent in HEP to increase wrist  AROM to [facility] to use hand in more than 75 % of ADL's and IADL's    Baseline  Wrist flexion 44, Ext 55, RD 14, UD 20    Time  2    Period  Weeks    Status  New    Target Date  07/14/19        OT Long Term Goals - 06/30/19 1501      OT LONG TERM GOAL #1   Title  L wrist strength increase to 4+/5 to use hand in carry more than 7 lbs, lift 1/2 gallon milk, drive    Baseline  ROM decrease - grip decrease - pain with lifting at base of thumb -    Period  Weeks    Status  New    Target Date  07/28/19      OT LONG TERM GOAL #2   Title  L grip strength increase with more than 10 lbs to lift and hold pots, drive , carry more than 7 lbs    Baseline  Grip R 45 lbs, L 24 lbs ; lat and 3 point grip L  9 ; R 14 and 17    Time  4    Period  Weeks    Status  New    Target Date  07/28/19      OT LONG TERM GOAL #3   Title  PRWHE function score increase with more than 10 points    Baseline  17/50 at eval on PRWHE for function    Time  4    Period  Weeks    Status  New    Target Date  07/28/19            Plan - 06/30/19 1455    Clinical Impression Statement  Pt present 8 1/2 wks out from L closed distal radius fx - pt show decrease AROM in wrist ext, flexion, RD, UD and decrease grip and prehension strength - has some increase pain on radial wrist. Will cont to make sure that it is not tendinitis but just being tender over distal radius - all limiting  her functional use in ADL's and IADL's    OT Occupational Profile and History  Problem Focused Assessment - Including review of records relating to presenting problem    Occupational performance deficits (Please refer to evaluation for details):  ADL's;IADL's;Leisure    Body Structure / Function / Physical Skills  ADL;ROM;UE functional use;Flexibility;Strength;Pain;IADL    Rehab Potential  Good    Clinical Decision Making  Limited treatment options, no task modification necessary    Comorbidities Affecting Occupational Performance:  None    Modification or Assistance to Complete Evaluation   No modification of tasks or assist necessary to complete eval    OT Frequency  1x / week    OT Duration  4 weeks    OT Treatment/Interventions  Self-care/ADL training;Therapeutic exercise;Patient/family education;Fluidtherapy;Manual Therapy;Passive range of motion;Paraffin    Plan  assess progress with HEP and update HEP    OT Home Exercise Plan  see pt instructoin    Consulted and Agree with Plan of Care  Patient       Patient will benefit from skilled therapeutic intervention in order to improve the following deficits and impairments:   Body Structure / Function / Physical Skills: ADL, ROM, UE functional use, Flexibility, Strength, Pain, IADL       Visit Diagnosis: Stiffness of left wrist, not elsewhere classified - Plan: Ot plan of care cert/re-cert  Pain in left wrist - Plan: Ot plan of care cert/re-cert  Muscle weakness (generalized) - Plan: Ot plan of care cert/re-cert    Problem List Patient Active Problem List   Diagnosis Date Noted  . Impaired fasting glucose 10/23/2018  . Pleurisy 09/26/2018  . Acute non-recurrent pansinusitis 05/20/2018  . Stomach irritation   . Abdominal bloating   . Abnormal upper gastrointestinal barium series   . Benign essential HTN 08/14/2017  . Bradycardia 08/14/2017  . Special screening for malignant neoplasms, colon   . Benign neoplasm of transverse  colon   . Diverticulosis of large intestine without diverticulitis   . Cerebrovascular accident (CVA) ([place]) 12/25/2014  . Shingles 12/25/2014  . Acid reflux 08/03/2014  . 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 Cerebral vascular accident ([place]) 08/03/2014  . Allergic rhinitis 07/31/2014  . 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 Atrophic vaginitis 07/31/2014  . Blood pressure elevated without history of HTN 07/31/2014  . Chest pain 07/31/2014  . CN (constipation) 07/31/2014  . Deep vein thrombosis ([place]) 07/31/2014  . Concussion injury of body structure 07/31/2014  . Elevated hemoglobin ([place]) 07/31/2014  . Fatigue 07/31/2014  . Hemorrhoid 07/31/2014  . Calcium blood increased 07/31/2014  . Hyperlipidemia 07/31/2014  . Blood glucose elevated 07/31/2014  .  Leg swelling 07/31/2014  . Lichen sclerosus 76/70/1100  . NASH (nonalcoholic steatohepatitis) 07/31/2014  . Headache, migraine 07/31/2014  . Burning or prickling sensation 07/31/2014  . 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 Lung mass 07/31/2014  . Abdominal pain, right upper quadrant 07/31/2014  . 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 Apnea, sleep 07/31/2014  . Avitaminosis D 07/31/2014  . Cephalalgia 10/21/2013  . Abnormal gait 09/24/2013  . H/O head injury 09/24/2013  . Temporary cerebral vascular dysfunction 11/15/1996    Rosalyn Gess OTR/l,CLT 06/30/2019, 3:07 PM  [facility] [address] [place], [address] Phone: [phone]   Fax:  [phone]  Name: ZAMYA CULHANE MRN: [id number] Date of Birth: [birth year]

## 2019-06-30 NOTE — Patient Instructions (Signed)
Heat  PROM and AAROM for wrist flexion ,ext, RD, UD  10 reps  2 lbs weight pain free for wrist sup/pro, RD,UD  10 reps  in 3 days if pain free  2 sets  And all 2 x day

## 2019-07-02 DIAGNOSIS — E782 Mixed hyperlipidemia: Secondary | ICD-10-CM | POA: Diagnosis not present

## 2019-07-02 DIAGNOSIS — I10 Essential (primary) hypertension: Secondary | ICD-10-CM | POA: Diagnosis not present

## 2019-07-02 DIAGNOSIS — I639 Cerebral infarction, unspecified: Secondary | ICD-10-CM | POA: Diagnosis not present

## 2019-07-08 ENCOUNTER — Other Ambulatory Visit: Payer: Self-pay

## 2019-07-08 ENCOUNTER — Ambulatory Visit: Payer: Medicare Other | Admitting: Occupational Therapy

## 2019-07-08 DIAGNOSIS — M6281 Muscle weakness (generalized): Secondary | ICD-10-CM | POA: Diagnosis not present

## 2019-07-08 DIAGNOSIS — S52502A Unspecified fracture of the lower end of left radius, initial encounter for closed fracture: Secondary | ICD-10-CM | POA: Insufficient documentation

## 2019-07-08 DIAGNOSIS — M25532 Pain in left wrist: Secondary | ICD-10-CM

## 2019-07-08 DIAGNOSIS — R0781 Pleurodynia: Secondary | ICD-10-CM | POA: Insufficient documentation

## 2019-07-08 DIAGNOSIS — M25632 Stiffness of left wrist, not elsewhere classified: Secondary | ICD-10-CM

## 2019-07-08 DIAGNOSIS — M79602 Pain in left arm: Secondary | ICD-10-CM | POA: Insufficient documentation

## 2019-07-08 NOTE — Therapy (Signed)
[place] [facility] [address] [place], [address] Phone: [phone]   Fax:  [phone]  Occupational Therapy Treatment  Patient Details  Name: Virginia Phillips MRN: [id number] Date of Birth: 12-03-1945 Referring Provider (OT): Tamala Julian PA   Encounter Date: 07/08/2019  OT End of Session - 07/08/19 1345    Visit Number  2    Number of Visits  4    Date for OT Re-Evaluation  07/28/19    OT Start Time  1344    OT Stop Time  1422    OT Time Calculation (min)  38 min    Activity Tolerance  Patient tolerated treatment well    Behavior During Therapy  [facility] for tasks assessed/performed       Past Medical History:  Diagnosis Date  . Coronary artery disease   . GERD (gastroesophageal reflux disease)   . Glaucoma   . Headache    migraines  . History of shingles   . Hypertension   . Pre-diabetes   . Pulmonary embolism on right [facility]) 2007   right lung  . Sjogren's syndrome ([place])   . Stroke [facility]) 1998    Past Surgical History:  Procedure Laterality Date  . ANKLE FRACTURE SURGERY Right 2007  . COLONOSCOPY WITH PROPOFOL N/A 05/10/2017   Procedure: COLONOSCOPY WITH PROPOFOL;  Surgeon: [name], MD;  Location: [facility] ENDOSCOPY;  Service: Endoscopy;  Laterality: N/A;  . ESOPHAGOGASTRODUODENOSCOPY (EGD) WITH PROPOFOL N/A 10/17/2017   Procedure: ESOPHAGOGASTRODUODENOSCOPY (EGD) WITH PROPOFOL;  Surgeon: [name], MD;  Location: [facility] ENDOSCOPY;  Service: Endoscopy;  Laterality: N/A;  . GANGLION CYST EXCISION Right    wrist  . HERNIA REPAIR     2011  . KNEE ARTHROSCOPY W/ ACL RECONSTRUCTION Right 2008  . KNEE SURGERY  08/05/2007  . LASIK    . NISSEN FUNDOPLICATION  4076    There were no vitals filed for this visit.  Subjective Assessment - 07/08/19 1344    Subjective   My blood sugar not doing well this past week - you need to check my wrist - I don't know if better - but the 2 lbs really easy     Pertinent History  Pt fell at her daughters front stairs and ended with closed distal radius fx L - was in cast and brace -and starting taking it off about week ago - only wearing it when out and about - still stiff and feel week -and some pain at the base of thumb reported    Patient Stated Goals  I want to be able to use my L hand and wrist like before - get my motion and strength back to push up , lift pots in kitchen, carry/grip things and open jars    Currently in Pain?  Yes    Pain Score  2     Pain Location  Hand    Pain Orientation  Left    Pain Descriptors / Indicators  Sore    Pain Type  Acute pain    Pain Onset  More than a month ago    Pain Frequency  Intermittent         [facility] OT Assessment - 07/08/19 0001      AROM   Left Wrist Extension  61 Degrees    Left Wrist Flexion  54 Degrees    Left Wrist Radial Deviation  21 Degrees    Left Wrist Ulnar Deviation  25  Degrees      Strength   Left Hand Grip (lbs)  25    Left Hand Lateral Pinch  9 lbs    Left Hand 3 Point Pinch  10 lbs               OT Treatments/Exercises (OP) - 07/08/19 0001      RUE Fluidotherapy   Number Minutes Fluidotherapy  --    RUE Fluidotherapy Location  --    Comments  --      LUE Fluidotherapy   Number Minutes Fluidotherapy  8 Minutes    LUE Fluidotherapy Location  Wrist    Comments  SOC to increase AROM and decrease stiffness         fluidotherapy done prior to soft tissue mobs - using graston tool nr 2 for sweeping over dorsal forearm and wrist prior to flexion stretch of wrist- tight and trigger points- pt's massage therapist can focus on it later this week and on volar forearm   Stretch done and review with pt for wrist flexion , extention 12 reps And AAROM for wrist  RD, UD  12 reps  Cont 2 lbs weight pain free for wrist sup/pro, RD,UD  10 reps 3 sets - pain free  pt can do 3 and 4 lbs for elbow and shoulder pain free this date in clinic - pt to do some elbow curls,  shoulder over head,punchec and triceps 12 reps   Add and review teal putty for gripping , lat and 3 point - pain free - needed mod A  And 12 reps - 2 x day increase 2nd set and 3rd set in 3 and 7 days         OT Education - 07/08/19 1344    Education Details  HEP review and changes    Person(s) Educated  Patient    Methods  Explanation;Demonstration;Tactile cues;Verbal cues;Handout    Comprehension  Returned demonstration;Verbalized understanding       OT Short Term Goals - 06/30/19 1459      OT SHORT TERM GOAL #1   Title  Pt to be independent in HEP to increase wrist  AROM to [facility] to use hand in more than 75 % of ADL's and IADL's    Baseline  Wrist flexion 44, Ext 55, RD 14, UD 20    Time  2    Period  Weeks    Status  New    Target Date  07/14/19        OT Long Term Goals - 06/30/19 1501      OT LONG TERM GOAL #1   Title  L wrist strength increase to 4+/5 to use hand in carry more than 7 lbs, lift 1/2 gallon milk, drive    Baseline  ROM decrease - grip decrease - pain with lifting at base of thumb -    Period  Weeks    Status  New    Target Date  07/28/19      OT LONG TERM GOAL #2   Title  L grip strength increase with more than 10 lbs to lift and hold pots, drive , carry more than 7 lbs    Baseline  Grip R 45 lbs, L 24 lbs ; lat and 3 point grip L  9 ; R 14 and 17    Time  4    Period  Weeks    Status  New    Target Date  07/28/19  OT LONG TERM GOAL #3   Title  PRWHE function score increase with more than 10 points    Baseline  17/50 at eval on PRWHE for function    Time  4    Period  Weeks    Status  New    Target Date  07/28/19            Plan - 07/08/19 1345    Clinical Impression Statement  Pt is 9 1/2 wks out from L closed distal radius fx- pt show increase AROM at L wrist  but need to focus on flexion and add this date putty for grip and prehension -pt to work on [employer] for 2 wks    OT Occupational Profile and History  Problem Focused  Assessment - Including review of records relating to presenting problem    Occupational performance deficits (Please refer to evaluation for details):  ADL's;IADL's;Leisure    Body Structure / Function / Physical Skills  ADL;ROM;UE functional use;Flexibility;Strength;Pain;IADL    Rehab Potential  Good    Clinical Decision Making  Limited treatment options, no task modification necessary    Comorbidities Affecting Occupational Performance:  None    Modification or Assistance to Complete Evaluation   No modification of tasks or assist necessary to complete eval    OT Frequency  Biweekly    OT Duration  4 weeks    OT Treatment/Interventions  Self-care/ADL training;Therapeutic exercise;Patient/family education;Fluidtherapy;Manual Therapy;Passive range of motion;Paraffin    Plan  assess progress with HEP and update HEP    OT Home Exercise Plan  see pt instructoin    Consulted and Agree with Plan of Care  Patient       Patient will benefit from skilled therapeutic intervention in order to improve the following deficits and impairments:   Body Structure / Function / Physical Skills: ADL, ROM, UE functional use, Flexibility, Strength, Pain, IADL       Visit Diagnosis: Pain in left wrist  Muscle weakness (generalized)  Stiffness of left wrist, not elsewhere classified    Problem List Patient Active Problem List   Diagnosis Date Noted  . Impaired fasting glucose 10/23/2018  . Pleurisy 09/26/2018  . Acute non-recurrent pansinusitis 05/20/2018  . Stomach irritation   . Abdominal bloating   . Abnormal upper gastrointestinal barium series   . Benign essential HTN 08/14/2017  . Bradycardia 08/14/2017  . Special screening for malignant neoplasms, colon   . Benign neoplasm of transverse colon   . Diverticulosis of large intestine without diverticulitis   . Cerebrovascular accident (CVA) ([place]) 12/25/2014  . Shingles 12/25/2014  . Acid reflux 08/03/2014  . Sliding hiatal hernia  08/03/2014  . Cerebral vascular accident ([place]) 08/03/2014  . Allergic rhinitis 07/31/2014  . 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 Atrophic vaginitis 07/31/2014  . Blood pressure elevated without history of HTN 07/31/2014  . Chest pain 07/31/2014  . CN (constipation) 07/31/2014  . Deep vein thrombosis ([place]) 07/31/2014  . Concussion injury of body structure 07/31/2014  . Elevated hemoglobin ([place]) 07/31/2014  . Fatigue 07/31/2014  . Hemorrhoid 07/31/2014  . Calcium blood increased 07/31/2014  . Hyperlipidemia 07/31/2014  . Blood glucose elevated 07/31/2014  . Leg swelling 07/31/2014  . Lichen sclerosus 76/73/4193  . NASH (nonalcoholic steatohepatitis) 07/31/2014  . Headache, migraine 07/31/2014  . Burning or prickling sensation 07/31/2014  . 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 Lung mass 07/31/2014  . Abdominal pain, right upper quadrant 07/31/2014  . 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  Apnea, sleep 07/31/2014  . Avitaminosis D 07/31/2014  . Cephalalgia 10/21/2013  . Abnormal gait 09/24/2013  . H/O head injury 09/24/2013  . Temporary cerebral vascular dysfunction 11/15/1996    Rosalyn Gess OTR/L,CLT 07/08/2019, 2:24 PM  [place] [facility] [address] [place], [address] Phone: [phone]   Fax:  [phone]  Name: NATURI ALARID MRN: [id number] Date of Birth: 1945-08-03

## 2019-07-08 NOTE — Patient Instructions (Addendum)
Same stretches  And 2 lbs weight for wrist and 3-4 lbs for elbow and shoulder - pain free  And add teal putty for gripping , lat and 3 point -but need pain free  And 12 reps - 2 x day increase 2nd set and 3rd set in 3 and 10 days

## 2019-07-17 DIAGNOSIS — S52572D Other intraarticular fracture of lower end of left radius, subsequent encounter for closed fracture with routine healing: Secondary | ICD-10-CM | POA: Diagnosis not present

## 2019-07-22 ENCOUNTER — Ambulatory Visit: Payer: Medicare Other | Admitting: Occupational Therapy

## 2019-07-22 ENCOUNTER — Encounter: Payer: Self-pay | Admitting: Occupational Therapy

## 2019-07-22 ENCOUNTER — Other Ambulatory Visit: Payer: Self-pay

## 2019-07-22 DIAGNOSIS — M25532 Pain in left wrist: Secondary | ICD-10-CM

## 2019-07-22 DIAGNOSIS — M6281 Muscle weakness (generalized): Secondary | ICD-10-CM | POA: Diagnosis not present

## 2019-07-22 DIAGNOSIS — M25632 Stiffness of left wrist, not elsewhere classified: Secondary | ICD-10-CM

## 2019-07-24 ENCOUNTER — Telehealth: Payer: Self-pay

## 2019-07-24 NOTE — Therapy (Signed)
[name] [facility] [address] [place], [address] Phone: [phone]   Fax:  [phone]  Occupational Therapy Treatment  Patient Details  Name: Virginia Phillips MRN: [id number] Date of Birth: 08-25-1945 Referring Provider (OT): Tamala Julian PA   Encounter Date: 07/22/2019    Past Medical History:  Diagnosis Date  . Coronary artery disease   . GERD (gastroesophageal reflux disease)   . Glaucoma   . Headache    migraines  . History of shingles   . Hypertension   . Pre-diabetes   . Pulmonary embolism on right [facility]) 2007   right lung  . Sjogren's syndrome ([place])   . Stroke [facility]) 1998    Past Surgical History:  Procedure Laterality Date  . ANKLE FRACTURE SURGERY Right 2007  . COLONOSCOPY WITH PROPOFOL N/A 05/10/2017   Procedure: COLONOSCOPY WITH PROPOFOL;  Surgeon: [name], MD;  Location: [facility] ENDOSCOPY;  Service: Endoscopy;  Laterality: N/A;  . ESOPHAGOGASTRODUODENOSCOPY (EGD) WITH PROPOFOL N/A 10/17/2017   Procedure: ESOPHAGOGASTRODUODENOSCOPY (EGD) WITH PROPOFOL;  Surgeon: [name], MD;  Location: [facility] ENDOSCOPY;  Service: Endoscopy;  Laterality: N/A;  . GANGLION CYST EXCISION Right    wrist  . HERNIA REPAIR     2011  . KNEE ARTHROSCOPY W/ ACL RECONSTRUCTION Right 2008  . KNEE SURGERY  08/05/2007  . LASIK    . NISSEN FUNDOPLICATION  6063    There were no vitals filed for this visit.  Subjective Assessment - 07/24/19 1603    Subjective   "I took a wrong turn today, my husband usually drives me."  Patient reports she is doing well, making good progress.    Pertinent History  Pt fell at her daughters front stairs and ended with closed distal radius fx L - was in cast and brace -and starting taking it off about week ago - only wearing it when out and about - still stiff and feel week -and some pain at the base of thumb reported    Patient Stated Goals  I want to be able to use my L  hand and wrist like before - get my motion and strength back to push up , lift pots in kitchen, carry/grip things and open jars    Currently in Pain?  Yes    Pain Score  2     Pain Location  Hand    Pain Orientation  Left    Pain Descriptors / Indicators  Sore    Pain Type  Acute pain    Pain Onset  More than a month ago         [facility] OT Assessment - 07/24/19 1611      AROM   Right Wrist Extension  65 Degrees    Right Wrist Flexion  85 Degrees    Right Wrist Radial Deviation  24 Degrees    Right Wrist Ulnar Deviation  25 Degrees    Left Wrist Extension  65 Degrees    Left Wrist Flexion  60 Degrees    Left Wrist Radial Deviation  25 Degrees    Left Wrist Ulnar Deviation  28 Degrees      Strength   Right Hand Grip (lbs)  45    Right Hand Lateral Pinch  14 lbs    Right Hand 3 Point Pinch  17 lbs    Left Hand Grip (lbs)  25    Left Hand Lateral Pinch  9 lbs    Left  Hand 3 Point Pinch  10 lbs      fluidotherapy done prior to soft tissue mobs - using graston tool nr 2 for sweeping over dorsal forearm and wrist prior to flexion stretch of wrist- tight and trigger points Patient has continued with massage therapist for other areas.  Measurements of ROM and strength per flow sheet PRWHE score of 9  Stretching performed with review for wrist flexion , extension12 reps, AAROM for wrist  RD, UD  12 reps  Cont 2 lbs weight pain free for wrist sup/pro, RD,UD  10 reps 3 sets - pain free  pt can continue with  3 and 4 lbs for elbow and shoulder pain free this date in clinic - pt to do some elbow curls, shoulder over head,punches and triceps 12 reps   teal putty for gripping , lat and 3 point - pain free - needed min cues 12 reps - 2 x day for 3 sets           OT Treatments/Exercises (OP) - 07/24/19 0001      LUE Fluidotherapy   Number Minutes Fluidotherapy  10 Minutes    LUE Fluidotherapy Location  Wrist    Comments  to increase ROM and decrease stiffness              OT Education - 07/24/19 1603    Education Details  HEP review and changes    Person(s) Educated  Patient    Methods  Explanation;Demonstration;Tactile cues;Verbal cues;Handout    Comprehension  Returned demonstration;Verbalized understanding       OT Short Term Goals - 06/30/19 1459      OT SHORT TERM GOAL #1   Title  Pt to be independent in HEP to increase wrist  AROM to [facility] to use hand in more than 75 % of ADL's and IADL's    Baseline  Wrist flexion 44, Ext 55, RD 14, UD 20    Time  2    Period  Weeks    Status  New    Target Date  07/14/19        OT Long Term Goals - 07/22/19 1537      OT LONG TERM GOAL #1   Title  L wrist strength increase to 4+/5 to use hand in carry more than 7 lbs, lift 1/2 gallon milk, drive    Baseline  ROM decrease - grip decrease - pain with lifting at base of thumb -    Period  Weeks    Status  Achieved      OT LONG TERM GOAL #2   Title  L grip strength increase with more than 10 lbs to lift and hold pots, drive , carry more than 7 lbs    Baseline  Grip R 45 lbs, L 24 lbs ; lat and 3 point grip L  9 ; R 14 and 17    Time  4    Period  Weeks    Status  Partially Met      OT LONG TERM GOAL #3   Title  PRWHE function score increase with more than 10 points    Baseline  17/50 at eval on PRWHE for function, score of 4/50 on 07/22/2019    Time  4    Period  Weeks    Status  Achieved            Plan - 07/24/19 1641    Clinical Impression Statement  Patient is now 11.5 weeks  s/p left closed distal radius fx, continues to demonstrate improvements in motion and continuing to work towards flexion and strengthening.  Goals updated this session, met goal on PRWHE for function.  Increased putty to 3 sets, 2 times a day.  Patient to follow up as needed in next 3-4 weeks and will call to make an appt if any problems or issues arise.    OT Occupational Profile and History  Problem Focused Assessment - Including review of records  relating to presenting problem    Occupational performance deficits (Please refer to evaluation for details):  ADL's;IADL's;Leisure    Body Structure / Function / Physical Skills  ADL;ROM;UE functional use;Flexibility;Strength;Pain;IADL    Rehab Potential  Good    Clinical Decision Making  Limited treatment options, no task modification necessary    Comorbidities Affecting Occupational Performance:  None    Modification or Assistance to Complete Evaluation   No modification of tasks or assist necessary to complete eval    OT Frequency  Biweekly    OT Duration  4 weeks    OT Treatment/Interventions  Self-care/ADL training;Therapeutic exercise;Patient/family education;Fluidtherapy;Manual Therapy;Passive range of motion;Paraffin    Consulted and Agree with Plan of Care  Patient       Patient will benefit from skilled therapeutic intervention in order to improve the following deficits and impairments:   Body Structure / Function / Physical Skills: ADL, ROM, UE functional use, Flexibility, Strength, Pain, IADL       Visit Diagnosis: Pain in left wrist  Muscle weakness (generalized)  Stiffness of left wrist, not elsewhere classified    Problem List Patient Active Problem List   Diagnosis Date Noted  . Pain in rib 07/08/2019  . Pain in left arm 07/08/2019  . Nondisplaced fracture of distal end of left radius 07/08/2019  . Impaired fasting glucose 10/23/2018  . Pleurisy 09/26/2018  . Acute non-recurrent pansinusitis 05/20/2018  . Stomach irritation   . Abdominal bloating   . Abnormal upper gastrointestinal barium series   . Benign essential HTN 08/14/2017  . Bradycardia 08/14/2017  . Special screening for malignant neoplasms, colon   . Benign neoplasm of transverse colon   . Diverticulosis of large intestine without diverticulitis   . Cerebrovascular accident (CVA) ([place]) 12/25/2014  . Shingles 12/25/2014  . Acid reflux 08/03/2014  . 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  Cerebral vascular accident ([place]) 08/03/2014  . Allergic rhinitis 07/31/2014  . 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 Atrophic vaginitis 07/31/2014  . Blood pressure elevated without history of HTN 07/31/2014  . Chest pain 07/31/2014  . CN (constipation) 07/31/2014  . Deep vein thrombosis ([place]) 07/31/2014  . Concussion injury of body structure 07/31/2014  . Elevated hemoglobin ([place]) 07/31/2014  . Fatigue 07/31/2014  . Hemorrhoid 07/31/2014  . Calcium blood increased 07/31/2014  . Hyperlipidemia 07/31/2014  . Blood glucose elevated 07/31/2014  . Leg swelling 07/31/2014  . Lichen sclerosus 76/80/8811  . NASH (nonalcoholic steatohepatitis) 07/31/2014  . Headache, migraine 07/31/2014  . Burning or prickling sensation 07/31/2014  . 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 Lung mass 07/31/2014  . Abdominal pain, right upper quadrant 07/31/2014  . 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 Apnea, sleep 07/31/2014  . Avitaminosis D 07/31/2014  . Cephalalgia 10/21/2013  . Abnormal gait 09/24/2013  . H/O head injury 09/24/2013  . Temporary cerebral vascular dysfunction 11/15/1996   Braxden Lovering T Kypton [place], OTR/L, CLT  Hanan Moen 07/24/2019, 4:41 PM  [place] [facility] [address] [place], [address] Phone: [phone]   Fax:  [phone]  Name: Virginia Phillips MRN: [id number] Date of Birth: [birth year]

## 2019-07-31 ENCOUNTER — Ambulatory Visit: Payer: Medicare Other

## 2019-08-04 ENCOUNTER — Other Ambulatory Visit: Payer: Self-pay | Admitting: Nurse Practitioner

## 2019-08-04 DIAGNOSIS — Z0001 Encounter for general adult medical examination with abnormal findings: Secondary | ICD-10-CM

## 2019-08-04 DIAGNOSIS — E559 Vitamin D deficiency, unspecified: Secondary | ICD-10-CM | POA: Diagnosis not present

## 2019-08-04 DIAGNOSIS — R5383 Other fatigue: Secondary | ICD-10-CM | POA: Diagnosis not present

## 2019-08-04 DIAGNOSIS — R7301 Impaired fasting glucose: Secondary | ICD-10-CM

## 2019-08-04 DIAGNOSIS — I10 Essential (primary) hypertension: Secondary | ICD-10-CM | POA: Diagnosis not present

## 2019-08-04 NOTE — Telephone Encounter (Signed)
Order for labs, including HgbA1c placed in Epic. She should be able to go to lab coop to have these drawn any time. Please advise her to go fasting. Thanks.

## 2019-08-04 NOTE — Progress Notes (Signed)
Order for labs, including HgbA1c placed in Epic. She should be able to go to lab coop to have these drawn any time. Please advise her to go fasting. Thanks.

## 2019-08-07 ENCOUNTER — Encounter: Payer: Self-pay | Admitting: Nurse Practitioner

## 2019-08-07 ENCOUNTER — Other Ambulatory Visit: Payer: Self-pay

## 2019-08-07 ENCOUNTER — Ambulatory Visit (INDEPENDENT_AMBULATORY_CARE_PROVIDER_SITE_OTHER): Payer: Medicare Other | Admitting: Nurse Practitioner

## 2019-08-07 VITALS — BP 124/74 | HR 84 | Temp 96.9°F | Resp 16 | Ht 66.0 in | Wt 183.6 lb

## 2019-08-07 DIAGNOSIS — E782 Mixed hyperlipidemia: Secondary | ICD-10-CM | POA: Diagnosis not present

## 2019-08-07 DIAGNOSIS — R7301 Impaired fasting glucose: Secondary | ICD-10-CM | POA: Diagnosis not present

## 2019-08-07 DIAGNOSIS — I10 Essential (primary) hypertension: Secondary | ICD-10-CM | POA: Diagnosis not present

## 2019-08-07 DIAGNOSIS — D485 Neoplasm of uncertain behavior of skin: Secondary | ICD-10-CM

## 2019-08-07 LAB — GLUCOSE, POCT (MANUAL RESULT ENTRY): POC Glucose: 90 mg/dl (ref 70–99)

## 2019-08-07 MED ORDER — PRAVASTATIN SODIUM 20 MG PO TABS: 20.0000 mg | ORAL_TABLET | Freq: Every day | ORAL | 3 refills | Status: DC

## 2019-08-07 NOTE — Progress Notes (Signed)
[facility] [place], [place] [address]  Internal MEDICINE  Office Visit Note  Patient Name: Virginia Phillips  [id number]  [id number]  Date of Service: 08/17/2019  Chief Complaint  Patient presents with  . Diabetes    elevated blood sugar levels      The patient is here for acute visit. She is concerned about her blood sugars. Feels like tey are up and down. Checked labs with HgbA1c 5.8 down from 6.0. blood sugars are essentially normal. Lipid panel showing mild elevation of LDL and total cholesterol were mildly elevated. She takes pravastatin 46m tablets.  Patient states that she does get fatiuged and short of breath with exertion. She also has some swelling in her feet, bilaterally. The patient does see cardiology and will call them to discuss new symptoms.  The patient does have a lesion on the left cheek area. States that it is changing, getting larger. Would like to consult a dermatologist   Pt is here for a sick visit.     Current Medication:  Outpatient Encounter Medications as of 08/07/2019  Medication Sig  . aspirin 81 MG tablet Take 162 mg by mouth daily at 2 PM.   . aspirin-acetaminophen-caffeine (EXCEDRIN MIGRAINE) 250-250-65 MG tablet Take 2 tablets by mouth daily as needed for headache or migraine.   .Marland Kitchenaspirin-sod bicarb-citric acid (ALKA-SELTZER) 325 MG TBEF tablet Take 650 mg by mouth every 6 (six) hours as needed (indigestion).  . Biotin (BIOTIN 5000) 5 MG CAPS Take 5,000 mg by mouth daily at 2 PM.  . BLACK CURRANT SEED OIL PO Take 1 capsule by mouth daily at 2 PM.  . Cholecalciferol (VITAMIN D) 2000 UNITS CAPS Take 2,000 Units by mouth daily at 2 PM.   . Coenzyme Q10 (CO Q10) 200 MG CAPS Take 200 mg by mouth daily at 2 PM.   . ibuprofen (ADVIL,MOTRIN) 200 MG tablet Take 400 mg by mouth daily as needed for headache or moderate pain.   . Lancets (ONETOUCH ULTRASOFT) lancets Check sugar once daily DX 73.9 needs for one touch ultra 2 lancets   . lidocaine (LIDODERM) 5 % Place 1 patch onto the skin daily as needed (pain). Remove & Discard patch within 12 hours or as directed by MD  . Misc Natural Products (PETADOLEX 50) 50 MG CAPS Take 100 mg by mouth 2 (two) times daily at 10 AM and 5 PM.  . ONE TOUCH ULTRA TEST test strip CHECK BLOOD SUGAR ONCE DAILY AS DIRECTED  . polyethylene glycol (MIRALAX / GLYCOLAX) packet Take 17 g by mouth daily.   . Polyvinyl Alcohol-Povidone (REFRESH OP) Place 1 drop into both eyes daily as needed (dry eyes).  . pyridOXINE (VITAMIN B-6) 100 MG tablet Take 100 mg by mouth daily at 2 PM.   . Riboflavin (B2) 100 MG TABS Take 100 mg by mouth 2 (two) times daily.  . SUPER B COMPLEX/C PO Take 1 tablet by mouth daily at 2 PM.  . Thiamine HCl (VITAMIN B-1) 250 MG tablet Take 250 mg by mouth daily at 2 PM.  . traMADol (ULTRAM) 50 MG tablet Take 1 tablet (50 mg total) by mouth every 6 (six) hours as needed.  . valsartan (DIOVAN) 80 MG tablet Take 80 mg by mouth daily at 2 PM.   . venlafaxine XR (EFFEXOR-XR) 75 MG 24 hr capsule Take 75 mg by mouth daily at 2 PM.  . vitamin B-12 (CYANOCOBALAMIN) 1000 MCG tablet Take 1,000 mcg by mouth daily  at 2 PM.  . vitamin C (ASCORBIC ACID) 500 MG tablet Take 500 mg by mouth daily at 2 PM.  . [DISCONTINUED] pravastatin (PRAVACHOL) 10 MG tablet TAKE 1 TABLET(10 MG) BY MOUTH AT BEDTIME (Patient taking differently: Take 10 mg by mouth at bedtime. )  . pravastatin (PRAVACHOL) 20 MG tablet Take 1 tablet (20 mg total) by mouth daily.   No facility-administered encounter medications on file as of 08/07/2019.      Medical History: Past Medical History:  Diagnosis Date  . Coronary artery disease   . GERD (gastroesophageal reflux disease)   . Glaucoma   . Headache    migraines  . History of shingles   . Hypertension   . Pre-diabetes   . Pulmonary embolism on right [facility]) 2007   right lung  . Sjogren's syndrome ([place])   . Stroke ([place]) 1998     Today's Vitals   08/07/19 1549   BP: 124/74  Pulse: 84  Resp: 16  Temp: (!) 96.9 F (36.1 C)  SpO2: 94%  Weight: 183 lb 9.6 oz (83.3 kg)  Height: 5' 6"  (1.676 m)   Body mass index is 29.63 kg/m.  Review of Systems  Constitutional: Positive for fatigue. Negative for activity change, chills and unexpected weight change.  HENT: Negative for congestion, postnasal drip, rhinorrhea, sneezing and sore throat.   Respiratory: Positive for shortness of breath. Negative for cough, chest tightness and wheezing.        Mostly with exertion.  Cardiovascular: Negative for chest pain and palpitations.  Gastrointestinal: Negative for abdominal pain, constipation, diarrhea, nausea and vomiting.  Endocrine: Negative for cold intolerance, heat intolerance, polydipsia and polyuria.       Feels as though blood sugars are elevated.   Musculoskeletal: Negative for arthralgias, back pain, joint swelling and neck pain.  Skin: Negative for rash.       Lesion on left cheek which patient states is getting larger   Allergic/Immunologic: Negative for environmental allergies.  Neurological: Negative for dizziness, tremors, numbness and headaches.  Hematological: Negative for adenopathy. Does not bruise/bleed easily.  Psychiatric/Behavioral: Negative for behavioral problems (Depression), sleep disturbance and suicidal ideas. The patient is not nervous/anxious.     Physical Exam Vitals and nursing note reviewed.  Constitutional:      General: She is not in acute distress.    Appearance: Normal appearance. She is well-developed. She is not diaphoretic.  HENT:     Head: Normocephalic and atraumatic.     Nose: Nose normal.     Mouth/Throat:     Pharynx: No oropharyngeal exudate.  Eyes:     Pupils: Pupils are equal, round, and reactive to light.  Neck:     Thyroid: No thyromegaly.     Vascular: No carotid bruit or JVD.     Trachea: No tracheal deviation.  Cardiovascular:     Rate and Rhythm: Normal rate and regular rhythm.     Heart  sounds: Normal heart sounds. No murmur heard.  No friction rub. No gallop.   Pulmonary:     Effort: Pulmonary effort is normal. No respiratory distress.     Breath sounds: Normal breath sounds. No wheezing or rales.  Chest:     Chest wall: No tenderness.  Abdominal:     Palpations: Abdomen is soft.  Musculoskeletal:        General: Normal range of motion.     Cervical back: Normal range of motion and neck supple.  Lymphadenopathy:     Cervical:  No cervical adenopathy.  Skin:    General: Skin is warm and dry.     Comments: There is lesion on the left cheek which is pink in color with slightly irregular borders. Lesion measures about 6m in diameter.   Neurological:     General: No focal deficit present.     Mental Status: She is alert and oriented to person, place, and time.     Cranial Nerves: No cranial nerve deficit.  Psychiatric:        Mood and Affect: Mood normal.        Behavior: Behavior normal.        Thought Content: Thought content normal.        Judgment: Judgment normal.    Assessment/Plan: 1. Impaired fasting glucose - POCT Glucose (CBG) 90 today. Recent check HgbA1c 5.8 today. Will continue to monitor closely.   2. Benign essential HTN Stable. Continue bp medication as prescribed   3. Mixed hyperlipidemia Fasting lipid panel mildly elevated but stable. Continue pravastatin as prescribed.  - pravastatin (PRAVACHOL) 20 MG tablet; Take 1 tablet (20 mg total) by mouth daily.  Dispense: 90 tablet; Refill: 3  4. Neoplasm of uncertain behavior of skin of face Small lesion present on left cheek. Refer to dermatology for further evaluation.  - Ambulatory referral to Dermatology  General Counseling: Makya verbalizes understanding of the findings of todays visit and agrees with plan of treatment. I have discussed any further diagnostic evaluation that may be needed or ordered today. We also reviewed her medications today. she has been encouraged to call the office with  any questions or concerns that should arise related to todays visit.    Counseling:  This patient was seen by HLeretha PolFNP Collaboration with Dr FLavera Guiseas a part of collaborative care agreement  Orders Placed This Encounter  Procedures  . Ambulatory referral to Dermatology  . POCT Glucose (CBG)    Meds ordered this encounter  Medications  . pravastatin (PRAVACHOL) 20 MG tablet    Sig: Take 1 tablet (20 mg total) by mouth daily.    Dispense:  90 tablet    Refill:  3    Increased prescription to 284mtablets. No new prescription sent to pharmacy    Order Specific Question:   Supervising Provider    Answer:   KHLavera [id number]]    Time spent: 30 Minutes

## 2019-08-12 LAB — COMPREHENSIVE METABOLIC PANEL
ALT: 25 IU/L (ref 0–32)
AST: 28 IU/L (ref 0–40)
Albumin/Globulin Ratio: 1.8 (ref 1.2–2.2)
Albumin: 4.5 g/dL (ref 3.7–4.7)
Alkaline Phosphatase: 63 IU/L (ref 48–121)
BUN/Creatinine Ratio: 18 (ref 12–28)
BUN: 15 mg/dL (ref 8–27)
Bilirubin Total: 0.2 mg/dL (ref 0.0–1.2)
CO2: 26 mmol/L (ref 20–29)
Calcium: 9.6 mg/dL (ref 8.7–10.3)
Chloride: 103 mmol/L (ref 96–106)
Creatinine, Ser: 0.82 mg/dL (ref 0.57–1.00)
GFR calc Af Amer: 82 mL/min/{1.73_m2} (ref >59)
GFR calc non Af Amer: 71 mL/min/{1.73_m2} (ref >59)
Globulin, Total: 2.5 g/dL (ref 1.5–4.5)
Glucose: 105 mg/dL — ABNORMAL HIGH (ref 65–99)
Potassium: 5.5 mmol/L — ABNORMAL HIGH (ref 3.5–5.2)
Sodium: 140 mmol/L (ref 134–144)
Total Protein: 7 g/dL (ref 6.0–8.5)

## 2019-08-12 LAB — VITAMIN D 1,25 DIHYDROXY
Vitamin D 1, 25 (OH)2 Total: 53 pg/mL
Vitamin D2 1, 25 (OH)2: <10 pg/mL
Vitamin D3 1, 25 (OH)2: 53 pg/mL

## 2019-08-12 LAB — CBC WITH DIFFERENTIAL/PLATELET
Basophils Absolute: 0.1 10*3/uL (ref 0.0–0.2)
Basos: 1 %
EOS (ABSOLUTE): 0.1 10*3/uL (ref 0.0–0.4)
Eos: 2 %
Hematocrit: 44 % (ref 34.0–46.6)
Hemoglobin: 14.7 g/dL (ref 11.1–15.9)
Immature Grans (Abs): 0 10*3/uL (ref 0.0–0.1)
Immature Granulocytes: 0 %
Lymphocytes Absolute: 1.6 10*3/uL (ref 0.7–3.1)
Lymphs: 33 %
MCH: 31.6 pg (ref 26.6–33.0)
MCHC: 33.4 g/dL (ref 31.5–35.7)
MCV: 95 fL (ref 79–97)
Monocytes Absolute: 0.4 10*3/uL (ref 0.1–0.9)
Monocytes: 9 %
Neutrophils Absolute: 2.7 10*3/uL (ref 1.4–7.0)
Neutrophils: 55 %
Platelets: 240 10*3/uL (ref 150–450)
RBC: 4.65 x10E6/uL (ref 3.77–5.28)
RDW: 12.2 % (ref 11.7–15.4)
WBC: 4.8 10*3/uL (ref 3.4–10.8)

## 2019-08-12 LAB — T4, FREE: Free T4: 0.88 ng/dL (ref 0.82–1.77)

## 2019-08-12 LAB — HEMOGLOBIN A1C
Est. average glucose Bld gHb Est-mCnc: 120 mg/dL
Hgb A1c MFr Bld: 5.8 % — ABNORMAL HIGH (ref 4.8–5.6)

## 2019-08-12 LAB — LIPID PANEL
Chol/HDL Ratio: 3 ratio (ref 0.0–4.4)
Cholesterol, Total: 207 mg/dL — ABNORMAL HIGH (ref 100–199)
HDL: 70 mg/dL (ref >39)
LDL Chol Calc (NIH): 116 mg/dL — ABNORMAL HIGH (ref 0–99)
Triglycerides: 118 mg/dL (ref 0–149)
VLDL Cholesterol Cal: 21 mg/dL (ref 5–40)

## 2019-08-12 LAB — TSH: TSH: 1.58 u[IU]/mL (ref 0.450–4.500)

## 2019-08-17 DIAGNOSIS — D485 Neoplasm of uncertain behavior of skin: Secondary | ICD-10-CM | POA: Insufficient documentation

## 2019-09-19 ENCOUNTER — Telehealth: Payer: Self-pay

## 2019-09-19 NOTE — Telephone Encounter (Signed)
Confirmed and screened for 09-23-19 ov.

## 2019-09-23 ENCOUNTER — Encounter: Payer: Self-pay | Admitting: Nurse Practitioner

## 2019-09-23 ENCOUNTER — Other Ambulatory Visit: Payer: Self-pay

## 2019-09-23 ENCOUNTER — Ambulatory Visit (INDEPENDENT_AMBULATORY_CARE_PROVIDER_SITE_OTHER): Payer: Medicare Other | Admitting: Nurse Practitioner

## 2019-09-23 VITALS — BP 139/75 | HR 87 | Temp 97.4°F | Resp 16 | Ht 66.0 in | Wt 184.0 lb

## 2019-09-23 DIAGNOSIS — I10 Essential (primary) hypertension: Secondary | ICD-10-CM

## 2019-09-23 DIAGNOSIS — Z0001 Encounter for general adult medical examination with abnormal findings: Secondary | ICD-10-CM | POA: Diagnosis not present

## 2019-09-23 DIAGNOSIS — R140 Abdominal distension (gaseous): Secondary | ICD-10-CM

## 2019-09-23 DIAGNOSIS — R300 Dysuria: Secondary | ICD-10-CM | POA: Diagnosis not present

## 2019-09-23 DIAGNOSIS — R7301 Impaired fasting glucose: Secondary | ICD-10-CM

## 2019-09-23 DIAGNOSIS — Z1231 Encounter for screening mammogram for malignant neoplasm of breast: Secondary | ICD-10-CM | POA: Diagnosis not present

## 2019-09-23 NOTE — Progress Notes (Signed)
[facility] [place], [place] [address]  Internal MEDICINE  Office Visit Note  Patient Name: Virginia Phillips  [id number]  [id number]  Date of Service: 10/19/2019   Pt is here for routine health maintenance examination   Chief Complaint  Patient presents with  . Medicare Wellness  . Gastroesophageal Reflux  . Diverticulosis  . Irritable Bowel Syndrome    Has been eating nuts/raisins = not feeling well   . Quality Metric Gaps    TDAP     The patient is here for health maintenance exam. She states that she has been having some upset stomach and abdominal cramping. Does have history of diverticulosis and ibs. She states that she has been eating nuts and raisins and both of these can irritate the conditions of IBS and diverticulosis. She has stopped eating these foods. Her last colonoscopy was 04/2017 which verified these conditions. She is due to have another colonoscopy in 2024.  She states that last week, she was having intermittent episodes of chest discomfort. She does see cardiology, Dr. Nehemiah Massed. She has been diagnosed with pericarditis in the past and is treated for this intermittently.  She had routine, fasting labs prior to this visit. Blood sugar was 105 with HgbA1c at 5.8. she has mildly elevated LDL and total cholesterol with LDL/HDL ratio at 3.0, indicating average risk of coronary artery disease. Her potassium is mildly elevated at 5.5. she should be set up for screening mammogram.   Current Medication: Outpatient Encounter Medications as of 09/23/2019  Medication Sig  . aspirin 81 MG tablet Take 162 mg by mouth daily at 2 PM.   . aspirin-acetaminophen-caffeine (EXCEDRIN MIGRAINE) 250-250-65 MG tablet Take 2 tablets by mouth daily as needed for headache or migraine.   Marland Kitchen aspirin-sod bicarb-citric acid (ALKA-SELTZER) 325 MG TBEF tablet Take 650 mg by mouth every 6 (six) hours as needed (indigestion).  . Biotin (BIOTIN 5000) 5 MG CAPS Take 5,000 mg by  mouth daily at 2 PM.  . BLACK CURRANT SEED OIL PO Take 1 capsule by mouth daily at 2 PM.  . Cholecalciferol (VITAMIN D) 2000 UNITS CAPS Take 2,000 Units by mouth daily at 2 PM.   . Coenzyme Q10 (CO Q10) 200 MG CAPS Take 200 mg by mouth daily at 2 PM.   . ibuprofen (ADVIL,MOTRIN) 200 MG tablet Take 400 mg by mouth daily as needed for headache or moderate pain.   . Lancets (ONETOUCH ULTRASOFT) lancets Check sugar once daily DX 73.9 needs for one touch ultra 2 lancets  . Misc Natural Products (PETADOLEX 50) 50 MG CAPS Take 100 mg by mouth 2 (two) times daily at 10 AM and 5 PM.  . ONE TOUCH ULTRA TEST test strip CHECK BLOOD SUGAR ONCE DAILY AS DIRECTED  . polyethylene glycol (MIRALAX / GLYCOLAX) packet Take 17 g by mouth daily.   . Polyvinyl Alcohol-Povidone (REFRESH OP) Place 1 drop into both eyes daily as needed (dry eyes).  . pyridOXINE (VITAMIN B-6) 100 MG tablet Take 100 mg by mouth daily at 2 PM.   . Riboflavin (B2) 100 MG TABS Take 100 mg by mouth 2 (two) times daily.  . SUPER B COMPLEX/C PO Take 1 tablet by mouth daily at 2 PM.  . Thiamine HCl (VITAMIN B-1) 250 MG tablet Take 250 mg by mouth daily at 2 PM.  . traMADol (ULTRAM) 50 MG tablet Take 1 tablet (50 mg total) by mouth every 6 (six) hours as needed.  . valsartan (DIOVAN)  80 MG tablet Take 80 mg by mouth daily at 2 PM.   . venlafaxine XR (EFFEXOR-XR) 75 MG 24 hr capsule Take 75 mg by mouth daily at 2 PM.  . vitamin B-12 (CYANOCOBALAMIN) 1000 MCG tablet Take 1,000 mcg by mouth daily at 2 PM.  . vitamin C (ASCORBIC ACID) 500 MG tablet Take 500 mg by mouth daily at 2 PM.  . [DISCONTINUED] lidocaine (LIDODERM) 5 % Place 1 patch onto the skin daily as needed (pain). Remove & Discard patch within 12 hours or as directed by MD  . [DISCONTINUED] pravastatin (PRAVACHOL) 20 MG tablet Take 1 tablet (20 mg total) by mouth daily.   No facility-administered encounter medications on file as of 09/23/2019.    Surgical History: Past Surgical  History:  Procedure Laterality Date  . ANKLE FRACTURE SURGERY Right 2007  . COLONOSCOPY WITH PROPOFOL N/A 05/10/2017   Procedure: COLONOSCOPY WITH PROPOFOL;  Surgeon: [name], MD;  Location: [facility] ENDOSCOPY;  Service: Endoscopy;  Laterality: N/A;  . ESOPHAGOGASTRODUODENOSCOPY (EGD) WITH PROPOFOL N/A 10/17/2017   Procedure: ESOPHAGOGASTRODUODENOSCOPY (EGD) WITH PROPOFOL;  Surgeon: [name], MD;  Location: [facility] ENDOSCOPY;  Service: Endoscopy;  Laterality: N/A;  . GANGLION CYST EXCISION Right    wrist  . HERNIA REPAIR     2011  . KNEE ARTHROSCOPY W/ ACL RECONSTRUCTION Right 2008  . KNEE SURGERY  08/05/2007  . LASIK    . NISSEN FUNDOPLICATION  1517    Medical History: Past Medical History:  Diagnosis Date  . Coronary artery disease   . GERD (gastroesophageal reflux disease)   . Glaucoma   . Headache    migraines  . History of shingles   . Hypertension   . Pre-diabetes   . Pulmonary embolism on right [facility]) 2007   right lung  . Sjogren's syndrome ([place])   . Stroke [facility]) 1998    Family History: Family History  Problem Relation Age of Onset  . Congestive Heart Failure Mother   . Parkinson's disease Mother   . Diabetes Father   . Scleroderma Sister   . Cancer Brother       Review of Systems  Constitutional: Positive for fatigue. Negative for activity change, chills and unexpected weight change.  HENT: Negative for congestion, postnasal drip, rhinorrhea, sneezing and sore throat.   Respiratory: Negative for cough, chest tightness, shortness of breath and wheezing.        Mostly with exertion.  Cardiovascular: Negative for chest pain and palpitations.       Has had some intermittent episodes of chest discomfort. She is scheduled to see her cardiologist in the near future.   Gastrointestinal: Negative for abdominal pain, constipation, diarrhea, nausea and vomiting.  Endocrine: Negative for cold intolerance, heat intolerance, polydipsia and polyuria.        Check labs with HgbA1c prior to this visit. Glucose is 105 with HgbA1c is 5.8.   Genitourinary: Negative for dysuria, frequency, hematuria and urgency.  Musculoskeletal: Negative for arthralgias, back pain, joint swelling and neck pain.  Skin: Negative for rash.       Lesion on left cheek which patient states is getting larger   Allergic/Immunologic: Negative for environmental allergies.  Neurological: Negative for dizziness, tremors, numbness and headaches.  Hematological: Negative for adenopathy. Does not bruise/bleed easily.  Psychiatric/Behavioral: Negative for behavioral problems (Depression), sleep disturbance and suicidal ideas. The patient is not nervous/anxious.      Today's Vitals   09/23/19 1450  BP: (!) 139/75  Pulse: 87  Resp: 16  Temp: (!) 97.4 F (36.3 C)  SpO2: 96%  Weight: 184 lb (83.5 kg)  Height: 5' 6"  (1.676 m)   Body mass index is 29.7 kg/m.  Physical Exam Vitals and nursing note reviewed.  Constitutional:      General: She is not in acute distress.    Appearance: Normal appearance. She is well-developed. She is not diaphoretic.  HENT:     Head: Normocephalic and atraumatic.     Nose: Nose normal.     Mouth/Throat:     Pharynx: No oropharyngeal exudate.  Eyes:     Pupils: Pupils are equal, round, and reactive to light.  Neck:     Thyroid: No thyromegaly.     Vascular: No carotid bruit or JVD.     Trachea: No tracheal deviation.  Cardiovascular:     Rate and Rhythm: Normal rate and regular rhythm.     Pulses: Normal pulses.     Heart sounds: Normal heart sounds. No murmur heard.  No friction rub. No gallop.   Pulmonary:     Effort: Pulmonary effort is normal. No respiratory distress.     Breath sounds: Normal breath sounds. No wheezing or rales.  Chest:     Chest wall: No tenderness.  Abdominal:     General: Bowel sounds are normal.     Palpations: Abdomen is soft.     Tenderness: There is abdominal tenderness.     Comments: Mild,  generalized abdominal tenderness with palpation.   Musculoskeletal:        General: Normal range of motion.     Cervical back: Normal range of motion and neck supple.  Lymphadenopathy:     Cervical: No cervical adenopathy.  Skin:    General: Skin is warm and dry.     Comments: There is lesion on the left cheek which is pink in color with slightly irregular borders. Lesion measures about 54m in diameter.   Neurological:     General: No focal deficit present.     Mental Status: She is alert and oriented to person, place, and time.     Cranial Nerves: No cranial nerve deficit.  Psychiatric:        Mood and Affect: Mood normal.        Behavior: Behavior normal.        Thought Content: Thought content normal.        Judgment: Judgment normal.    Depression screen [facility]/9 09/23/2019 09/23/2019 08/07/2019 06/24/2019 05/08/2018  Decreased Interest 0 0 0 0 0  Down, Depressed, Hopeless 0 0 0 0 0  PHQ - 2 Score 0 0 0 0 0  Altered sleeping - - - - -  Tired, decreased energy - - - - -  Change in appetite - - - - -  Feeling bad or failure about yourself  - - - - -  Trouble concentrating - - - - -  Moving slowly or fidgety/restless - - - - -  Suicidal thoughts - - - - -  PHQ-9 Score - - - - -  Difficult doing work/chores - - - - -    Functional Status Survey: Is the patient deaf or have difficulty hearing?: No Does the patient have difficulty seeing, even when wearing glasses/contacts?: Yes Does the patient have difficulty concentrating, remembering, or making decisions?: No Does the patient have difficulty walking or climbing stairs?: No Does the patient have difficulty dressing or bathing?: No Does the patient have difficulty doing errands  alone such as visiting a doctor's office or shopping?: No  MMSE - [place] Exam 09/23/2019  Orientation to time 5  Orientation to Place 5  Registration 3  Attention/ Calculation 5  Recall 3  Language- name 2 objects 2  Language- repeat 1   Language- follow 3 step command 3  Language- read & follow direction 1  Write a sentence 1  Copy design 1  Total score 30    Fall Risk  09/23/2019 09/23/2019 08/07/2019 06/24/2019 05/08/2018  Falls in the past year? 1 1 1 1  0  Number falls in past yr: 1 1 0 0 0  Injury with Fall? 1 1 1 1  0  Comment broken wrist Broken wrist broke left wrist - -      LABS: Recent Results (from the past 2160 hour(s))  HgB A1c     Status: Abnormal   Collection Time: 08/04/19  3:48 PM  Result Value Ref Range   Hgb A1c MFr Bld 5.8 (H) 4.8 - 5.6 %    Comment:          Prediabetes: 5.7 - 6.4          Diabetes: >6.4          Glycemic control for adults with diabetes: <7.0    Est. average glucose Bld gHb Est-mCnc 120 mg/dL  Vitamin D 1,25 dihydroxy     Status: None   Collection Time: 08/04/19  3:48 PM  Result Value Ref Range   Vitamin D 1, 25 (OH)2 Total 53 pg/mL    Comment: Reference Range: Adults: 21 - 65    Vitamin D2 1, 25 (OH)2 <10 pg/mL    Comment: This test was developed and its performance characteristics determined by [facility]. It has not been cleared or approved by the Food and Drug Administration.    Vitamin D3 1, 25 (OH)2 53 pg/mL    Comment: This test was developed and its performance characteristics determined by [facility]. It has not been cleared or approved by the Food and Drug Administration.   Lipid panel     Status: Abnormal   Collection Time: 08/04/19  3:48 PM  Result Value Ref Range   Cholesterol, Total 207 (H) 100 - 199 mg/dL   Triglycerides 118 0 - 149 mg/dL   HDL 70 >39 mg/dL   VLDL Cholesterol Cal 21 5 - 40 mg/dL   LDL Chol Calc (NIH) 116 (H) 0 - 99 mg/dL   Chol/HDL Ratio 3.0 0.0 - 4.4 ratio    Comment:                                   T. Chol/HDL Ratio                                             Men  Women                               1/2 Avg.Risk  3.4    3.3                                   Avg.Risk  5.0    4.4  2X Avg.Risk  9.6     7.1                                3X Avg.Risk 23.4   11.0   TSH     Status: None   Collection Time: 08/04/19  3:48 PM  Result Value Ref Range   TSH 1.580 0.450 - 4.500 uIU/mL  T4, free     Status: None   Collection Time: 08/04/19  3:48 PM  Result Value Ref Range   Free T4 0.88 0.82 - 1.77 ng/dL  Comprehensive metabolic panel     Status: Abnormal   Collection Time: 08/04/19  3:48 PM  Result Value Ref Range   Glucose 105 (H) 65 - 99 mg/dL   BUN 15 8 - 27 mg/dL   Creatinine, Ser 0.82 0.57 - 1.00 mg/dL   GFR calc non Af Amer 71 >59 mL/min/1.73   GFR calc Af Amer 82 >59 mL/min/1.73    Comment: **[facility] currently reports eGFR in compliance with the current**   recommendations of the [employer]. [facility] will   update reporting as new guidelines are published from the NKF-ASN   Task force.    BUN/Creatinine Ratio 18 12 - 28   Sodium 140 134 - 144 mmol/L   Potassium 5.5 (H) 3.5 - 5.2 mmol/L   Chloride 103 96 - 106 mmol/L   CO2 26 20 - 29 mmol/L   Calcium 9.6 8.7 - 10.3 mg/dL   Total Protein 7.0 6.0 - 8.5 g/dL   Albumin 4.5 3.7 - 4.7 g/dL   Globulin, Total 2.5 1.5 - 4.5 g/dL   Albumin/Globulin Ratio 1.8 1.2 - 2.2   Bilirubin Total 0.2 0.0 - 1.2 mg/dL   Alkaline Phosphatase 63 48 - 121 IU/L   AST 28 0 - 40 IU/L   ALT 25 0 - 32 IU/L  CBC with Differential/Platelet     Status: None   Collection Time: 08/04/19  3:48 PM  Result Value Ref Range   WBC 4.8 3.4 - 10.8 x10E3/uL   RBC 4.65 3.77 - 5.28 x10E6/uL   Hemoglobin 14.7 11.1 - 15.9 g/dL   Hematocrit 44.0 34.0 - 46.6 %   MCV 95 79 - 97 fL   MCH 31.6 26.6 - 33.0 pg   MCHC 33.4 31 - 35 g/dL   RDW 12.2 11.7 - 15.4 %   Platelets 240 150 - 450 x10E3/uL   Neutrophils 55 Not Estab. %   Lymphs 33 Not Estab. %   Monocytes 9 Not Estab. %   Eos 2 Not Estab. %   Basos 1 Not Estab. %   Neutrophils Absolute 2.7 1 - 7 x10E3/uL   Lymphocytes Absolute 1.6 0 - 3 x10E3/uL   Monocytes Absolute 0.4 0 - 0 x10E3/uL   EOS  (ABSOLUTE) 0.1 0.0 - 0.4 x10E3/uL   Basophils Absolute 0.1 0 - 0 x10E3/uL   Immature Granulocytes 0 Not Estab. %   Immature Grans (Abs) 0.0 0.0 - 0.1 x10E3/uL  POCT Glucose (CBG)     Status: None   Collection Time: 08/07/19  4:03 PM  Result Value Ref Range   POC Glucose 90 70 - 99 mg/dl  UA/M w/rflx Culture, Routine     Status: None   Collection Time: 09/23/19  9:00 AM   Specimen: Urine   Urine  Result Value Ref Range   Specific Gravity, UA 1.019 1.005 - 1.030  pH, UA 5.5 5.0 - 7.5   Color, UA Yellow Yellow   Appearance Ur Clear Clear   Leukocytes,UA Negative Negative   Protein,UA Negative Negative/Trace   Glucose, UA Negative Negative   Ketones, UA Negative Negative   RBC, UA Negative Negative   Bilirubin, UA Negative Negative   Urobilinogen, Ur 0.2 0.2 - 1.0 mg/dL   Nitrite, UA Negative Negative   Microscopic Examination Comment     Comment: Microscopic follows if indicated.   Microscopic Examination See below:     Comment: Microscopic was indicated and was performed.   Urinalysis Reflex Comment     Comment: This specimen has reflexed to a Urine Culture.  Microscopic Examination     Status: Abnormal   Collection Time: 09/23/19  9:00 AM   Urine  Result Value Ref Range   WBC, UA 6-10 (A) 0 - 5 /hpf   RBC 0-2 0 - 2 /hpf   Epithelial Cells (non renal) 0-10 0 - 10 /hpf   Casts None seen None seen /lpf   Bacteria, UA None seen None seen/Few  Urine Culture, Reflex     Status: Abnormal   Collection Time: 09/23/19  9:00 AM   Urine  Result Value Ref Range   Urine Culture, Routine Final report (A)    Organism ID, Bacteria Enterococcus faecalis (A)     Comment: 25,000-50,000 colony forming units per mL   Antimicrobial Susceptibility Comment     Comment:       ** S = Susceptible; I = Intermediate; R = Resistant **                    P = Positive; N = Negative             MICS are expressed in micrograms per mL    Antibiotic                 RSLT#1    RSLT#2    RSLT#3     RSLT#4 Ciprofloxacin                  S Levofloxacin                   S Nitrofurantoin                 S Penicillin                     S Tetracycline                   S Vancomycin                     S     .Assessment/Plan: 1. Encounter for general adult medical examination with abnormal findings Annual health maintenance exam today.  2. Impaired fasting glucose Reviewed labs with the patient showing HgbA1c as 5.8. discussed risks of developing diabetes in the future. She was given written information regarding diabetic diet. Will monitor bloodsugars closey.   3. Abdominal bloating Likely related to possible diverticulosis/diverticulitis. Will monitor.   4. Benign essential HTN Stable. Continue bp medication as prescribed   5. Encounter for screening mammogram for malignant neoplasm of breast - MM DIGITAL SCREENING BILATERAL; Future  6. Dysuria - UA/M w/rflx Culture, Routine  General Counseling: Jalexia verbalizes understanding of the findings of todays visit and agrees with plan of treatment. I have discussed any further diagnostic evaluation that may be needed or  ordered today. We also reviewed her medications today. she has been encouraged to call the office with any questions or concerns that should arise related to todays visit.    Counseling:  This patient was seen by Leretha Pol FNP Collaboration with Dr Lavera Guise as a part of collaborative care agreement  Orders Placed This Encounter  Procedures  . Microscopic Examination  . Urine Culture, Reflex  . MM DIGITAL SCREENING BILATERAL  . UA/M w/rflx Culture, Routine      Total time spent: 5 Minutes  Time spent includes review of chart, medications, test results, and follow up plan with the patient.     Lavera Guise, MD  Internal Medicine

## 2019-09-29 LAB — UA/M W/RFLX CULTURE, ROUTINE
Bilirubin, UA: NEGATIVE
Glucose, UA: NEGATIVE
Ketones, UA: NEGATIVE
Leukocytes,UA: NEGATIVE
Nitrite, UA: NEGATIVE
Protein,UA: NEGATIVE
RBC, UA: NEGATIVE
Specific Gravity, UA: 1.019 (ref 1.005–1.030)
Urobilinogen, Ur: 0.2 mg/dL (ref 0.2–1.0)
pH, UA: 5.5 (ref 5.0–7.5)

## 2019-09-29 LAB — MICROSCOPIC EXAMINATION
Bacteria, UA: NONE SEEN
Casts: NONE SEEN /lpf

## 2019-09-29 LAB — URINE CULTURE, REFLEX

## 2019-10-10 DIAGNOSIS — H401134 Primary open-angle glaucoma, bilateral, indeterminate stage: Secondary | ICD-10-CM | POA: Diagnosis not present

## 2019-10-10 DIAGNOSIS — H43813 Vitreous degeneration, bilateral: Secondary | ICD-10-CM | POA: Diagnosis not present

## 2019-10-10 DIAGNOSIS — H2513 Age-related nuclear cataract, bilateral: Secondary | ICD-10-CM | POA: Diagnosis not present

## 2019-10-19 DIAGNOSIS — R300 Dysuria: Secondary | ICD-10-CM | POA: Insufficient documentation

## 2019-10-19 DIAGNOSIS — Z1231 Encounter for screening mammogram for malignant neoplasm of breast: Secondary | ICD-10-CM | POA: Insufficient documentation

## 2019-10-19 DIAGNOSIS — Z0001 Encounter for general adult medical examination with abnormal findings: Secondary | ICD-10-CM | POA: Insufficient documentation

## 2019-10-23 DIAGNOSIS — Z03818 Encounter for observation for suspected exposure to other biological agents ruled out: Secondary | ICD-10-CM | POA: Diagnosis not present

## 2019-10-23 DIAGNOSIS — Z1152 Encounter for screening for COVID-19: Secondary | ICD-10-CM | POA: Diagnosis not present

## 2019-10-28 DIAGNOSIS — Z1152 Encounter for screening for COVID-19: Secondary | ICD-10-CM | POA: Diagnosis not present

## 2019-10-28 DIAGNOSIS — Z03818 Encounter for observation for suspected exposure to other biological agents ruled out: Secondary | ICD-10-CM | POA: Diagnosis not present

## 2019-10-29 DIAGNOSIS — H2513 Age-related nuclear cataract, bilateral: Secondary | ICD-10-CM | POA: Diagnosis not present

## 2019-10-29 DIAGNOSIS — H401134 Primary open-angle glaucoma, bilateral, indeterminate stage: Secondary | ICD-10-CM | POA: Diagnosis not present

## 2019-10-29 DIAGNOSIS — H43813 Vitreous degeneration, bilateral: Secondary | ICD-10-CM | POA: Diagnosis not present

## 2019-10-30 ENCOUNTER — Other Ambulatory Visit: Payer: Self-pay | Admitting: Dermatology

## 2019-10-30 ENCOUNTER — Ambulatory Visit (INDEPENDENT_AMBULATORY_CARE_PROVIDER_SITE_OTHER): Payer: Medicare Other | Admitting: Dermatology

## 2019-10-30 ENCOUNTER — Other Ambulatory Visit: Payer: Self-pay

## 2019-10-30 DIAGNOSIS — D485 Neoplasm of uncertain behavior of skin: Secondary | ICD-10-CM | POA: Diagnosis not present

## 2019-10-30 DIAGNOSIS — L821 Other seborrheic keratosis: Secondary | ICD-10-CM | POA: Diagnosis not present

## 2019-10-30 DIAGNOSIS — L820 Inflamed seborrheic keratosis: Secondary | ICD-10-CM | POA: Diagnosis not present

## 2019-10-30 DIAGNOSIS — L814 Other melanin hyperpigmentation: Secondary | ICD-10-CM | POA: Diagnosis not present

## 2019-10-30 NOTE — Patient Instructions (Addendum)

## 2019-10-30 NOTE — Progress Notes (Signed)
   New Patient Visit  Subjective  Virginia Phillips is a 74 y.o. female who presents for the following: Lesion evaluation  She has a lesion on her left cheek present for about 3 years. It gets flaky and it does itch. She has not had it treated.  Patient accompanied by husband.   The following portions of the chart were reviewed this encounter and updated as appropriate:  Tobacco  Allergies  Meds  Problems  Med Hx  Surg Hx  Fam Hx      Review of Systems:  No other skin or systemic complaints except as noted in HPI or Assessment and Plan.  Objective  Well appearing patient in no apparent distress; mood and affect are within normal limits.  A focused examination was performed including face, neck, eyelids, arms, hands. Relevant physical exam findings are noted in the Assessment and Plan.  Objective  Left Cheek: 0.7cm erythematous tan papule       Assessment & Plan  Neoplasm of uncertain behavior of skin Left Cheek  Skin / nail biopsy Type of biopsy: tangential   Informed consent: discussed and consent obtained   Timeout: patient name, date of birth, surgical site, and procedure verified   Patient was prepped and draped in usual sterile fashion: Area prepped with isopropyl alcohol. Anesthesia: the lesion was anesthetized in a standard fashion   Anesthetic:  1% lidocaine w/ epinephrine 1-100,000 buffered w/ 8.4% NaHCO3 Instrument used: flexible razor blade   Hemostasis achieved with: aluminum chloride   Outcome: patient tolerated procedure well   Post-procedure details: wound care instructions given   Additional details:  Mupirocin and a bandage applied  Lentigines - Scattered tan macules - Discussed due to sun exposure - Benign, observe - Call for any changes  Seborrheic Keratoses - Stuck-on, waxy, tan-brown papules and plaques  - Discussed benign etiology and prognosis. - Observe - Call for any changes  Follow-up as needed.  Graciella Belton, RMA, am acting  as scribe for Forest Gleason, MD .  Documentation: I have reviewed the above documentation for accuracy and completeness, and I agree with the above.  Forest Gleason, MD

## 2019-11-04 DIAGNOSIS — H2512 Age-related nuclear cataract, left eye: Secondary | ICD-10-CM | POA: Diagnosis not present

## 2019-11-05 DIAGNOSIS — I10 Essential (primary) hypertension: Secondary | ICD-10-CM | POA: Diagnosis not present

## 2019-11-05 DIAGNOSIS — I639 Cerebral infarction, unspecified: Secondary | ICD-10-CM | POA: Diagnosis not present

## 2019-11-05 DIAGNOSIS — R0789 Other chest pain: Secondary | ICD-10-CM | POA: Diagnosis not present

## 2019-11-05 DIAGNOSIS — Z23 Encounter for immunization: Secondary | ICD-10-CM | POA: Diagnosis not present

## 2019-11-05 DIAGNOSIS — E782 Mixed hyperlipidemia: Secondary | ICD-10-CM | POA: Diagnosis not present

## 2019-11-06 NOTE — Progress Notes (Signed)
Skin , left cheek SEBORRHEIC KERATOSIS, IRRITATED   "irritated wisdom spot" No additional treatment needed unless this is bothersome.   Please call us at [phone] option 4 or message [place] on MyChart if you have any questions or concerns.

## 2019-11-10 ENCOUNTER — Emergency Department
Admission: EM | Admit: 2019-11-10 | Discharge: 2019-11-10 | Discharge status: Against Medical Advice | Payer: Medicare Other

## 2019-11-10 DIAGNOSIS — Z8673 Personal history of transient ischemic attack (TIA), and cerebral infarction without residual deficits: Secondary | ICD-10-CM | POA: Diagnosis not present

## 2019-11-10 DIAGNOSIS — H269 Unspecified cataract: Secondary | ICD-10-CM | POA: Diagnosis not present

## 2019-11-10 DIAGNOSIS — I10 Essential (primary) hypertension: Secondary | ICD-10-CM | POA: Diagnosis not present

## 2019-11-10 DIAGNOSIS — H401123 Primary open-angle glaucoma, left eye, severe stage: Secondary | ICD-10-CM | POA: Diagnosis not present

## 2019-11-10 DIAGNOSIS — H43819 Vitreous degeneration, unspecified eye: Secondary | ICD-10-CM | POA: Diagnosis not present

## 2019-11-10 DIAGNOSIS — H409 Unspecified glaucoma: Secondary | ICD-10-CM | POA: Diagnosis not present

## 2019-11-10 DIAGNOSIS — E785 Hyperlipidemia, unspecified: Secondary | ICD-10-CM | POA: Diagnosis not present

## 2019-11-10 DIAGNOSIS — H2512 Age-related nuclear cataract, left eye: Secondary | ICD-10-CM | POA: Diagnosis not present

## 2019-11-10 DIAGNOSIS — H401133 Primary open-angle glaucoma, bilateral, severe stage: Secondary | ICD-10-CM | POA: Diagnosis not present

## 2019-11-11 ENCOUNTER — Encounter: Payer: Self-pay | Admitting: Dermatology

## 2019-11-12 ENCOUNTER — Telehealth: Payer: Self-pay

## 2019-11-12 NOTE — Telephone Encounter (Signed)
Spoke with patient regarding leaving ER against medical advise, she had cataract sx 11/10/19 and had slight chest pains that evening once she got there she started feeling better and left. I did speak with her and advised next time she can call us and we can let her know to call EMT or come in office to avoid ER due to rising covid 19 cases and that we have answering service as well, pt agreed and will call us if needs anything. Virginia Phillips

## 2019-11-17 DIAGNOSIS — H2511 Age-related nuclear cataract, right eye: Secondary | ICD-10-CM | POA: Diagnosis not present

## 2019-11-24 DIAGNOSIS — Z961 Presence of intraocular lens: Secondary | ICD-10-CM | POA: Diagnosis not present

## 2019-11-24 DIAGNOSIS — I10 Essential (primary) hypertension: Secondary | ICD-10-CM | POA: Diagnosis not present

## 2019-11-24 DIAGNOSIS — H401133 Primary open-angle glaucoma, bilateral, severe stage: Secondary | ICD-10-CM | POA: Diagnosis not present

## 2019-11-24 DIAGNOSIS — Z8673 Personal history of transient ischemic attack (TIA), and cerebral infarction without residual deficits: Secondary | ICD-10-CM | POA: Diagnosis not present

## 2019-11-24 DIAGNOSIS — E785 Hyperlipidemia, unspecified: Secondary | ICD-10-CM | POA: Diagnosis not present

## 2019-11-24 DIAGNOSIS — H401113 Primary open-angle glaucoma, right eye, severe stage: Secondary | ICD-10-CM | POA: Diagnosis not present

## 2019-11-24 DIAGNOSIS — H2511 Age-related nuclear cataract, right eye: Secondary | ICD-10-CM | POA: Diagnosis not present

## 2019-11-24 DIAGNOSIS — G4730 Sleep apnea, unspecified: Secondary | ICD-10-CM | POA: Diagnosis not present

## 2019-11-24 DIAGNOSIS — H269 Unspecified cataract: Secondary | ICD-10-CM | POA: Diagnosis not present

## 2019-11-24 DIAGNOSIS — Z9842 Cataract extraction status, left eye: Secondary | ICD-10-CM | POA: Diagnosis not present

## 2019-11-24 DIAGNOSIS — E7800 Pure hypercholesterolemia, unspecified: Secondary | ICD-10-CM | POA: Diagnosis not present

## 2019-12-05 ENCOUNTER — Other Ambulatory Visit: Payer: Self-pay | Admitting: Nurse Practitioner

## 2019-12-05 DIAGNOSIS — Z23 Encounter for immunization: Secondary | ICD-10-CM | POA: Diagnosis not present

## 2019-12-05 DIAGNOSIS — Z1231 Encounter for screening mammogram for malignant neoplasm of breast: Secondary | ICD-10-CM

## 2019-12-08 DIAGNOSIS — Z23 Encounter for immunization: Secondary | ICD-10-CM | POA: Diagnosis not present

## 2019-12-09 DIAGNOSIS — I10 Essential (primary) hypertension: Secondary | ICD-10-CM | POA: Diagnosis not present

## 2019-12-09 DIAGNOSIS — I639 Cerebral infarction, unspecified: Secondary | ICD-10-CM | POA: Diagnosis not present

## 2019-12-09 DIAGNOSIS — E782 Mixed hyperlipidemia: Secondary | ICD-10-CM | POA: Diagnosis not present

## 2019-12-09 DIAGNOSIS — I739 Peripheral vascular disease, unspecified: Secondary | ICD-10-CM | POA: Diagnosis not present

## 2019-12-17 ENCOUNTER — Inpatient Hospital Stay: Admission: RE | Admit: 2019-12-17 | Payer: Medicare Other | Source: Ambulatory Visit

## 2020-01-05 ENCOUNTER — Ambulatory Visit: Payer: Medicare Other

## 2020-01-06 DIAGNOSIS — I739 Peripheral vascular disease, unspecified: Secondary | ICD-10-CM | POA: Diagnosis not present

## 2020-01-06 DIAGNOSIS — Z8673 Personal history of transient ischemic attack (TIA), and cerebral infarction without residual deficits: Secondary | ICD-10-CM | POA: Diagnosis not present

## 2020-01-06 DIAGNOSIS — I6523 Occlusion and stenosis of bilateral carotid arteries: Secondary | ICD-10-CM | POA: Diagnosis not present

## 2020-01-06 DIAGNOSIS — I10 Essential (primary) hypertension: Secondary | ICD-10-CM | POA: Diagnosis not present

## 2020-01-06 DIAGNOSIS — E785 Hyperlipidemia, unspecified: Secondary | ICD-10-CM | POA: Diagnosis not present

## 2020-01-06 DIAGNOSIS — I639 Cerebral infarction, unspecified: Secondary | ICD-10-CM | POA: Diagnosis not present

## 2020-01-14 ENCOUNTER — Other Ambulatory Visit: Payer: Self-pay | Admitting: Nurse Practitioner

## 2020-01-14 DIAGNOSIS — E782 Mixed hyperlipidemia: Secondary | ICD-10-CM | POA: Diagnosis not present

## 2020-01-14 DIAGNOSIS — R7301 Impaired fasting glucose: Secondary | ICD-10-CM | POA: Diagnosis not present

## 2020-01-14 DIAGNOSIS — I10 Essential (primary) hypertension: Secondary | ICD-10-CM | POA: Diagnosis not present

## 2020-01-15 LAB — COMPREHENSIVE METABOLIC PANEL
ALT: 22 IU/L (ref 0–32)
AST: 21 IU/L (ref 0–40)
Albumin/Globulin Ratio: 2 (ref 1.2–2.2)
Albumin: 4.4 g/dL (ref 3.7–4.7)
Alkaline Phosphatase: 67 IU/L (ref 44–121)
BUN/Creatinine Ratio: 12 (ref 12–28)
BUN: 11 mg/dL (ref 8–27)
Bilirubin Total: 0.4 mg/dL (ref 0.0–1.2)
CO2: 25 mmol/L (ref 20–29)
Calcium: 9.8 mg/dL (ref 8.7–10.3)
Chloride: 106 mmol/L (ref 96–106)
Creatinine, Ser: 0.89 mg/dL (ref 0.57–1.00)
GFR calc Af Amer: 74 mL/min/{1.73_m2} (ref >59)
GFR calc non Af Amer: 64 mL/min/{1.73_m2} (ref >59)
Globulin, Total: 2.2 g/dL (ref 1.5–4.5)
Glucose: 122 mg/dL — ABNORMAL HIGH (ref 65–99)
Potassium: 4.9 mmol/L (ref 3.5–5.2)
Sodium: 144 mmol/L (ref 134–144)
Total Protein: 6.6 g/dL (ref 6.0–8.5)

## 2020-01-15 LAB — LIPID PANEL WITH LDL/HDL RATIO
Cholesterol, Total: 204 mg/dL — ABNORMAL HIGH (ref 100–199)
HDL: 69 mg/dL (ref >39)
LDL Chol Calc (NIH): 113 mg/dL — ABNORMAL HIGH (ref 0–99)
LDL/HDL Ratio: 1.6 ratio (ref 0.0–3.2)
Triglycerides: 129 mg/dL (ref 0–149)
VLDL Cholesterol Cal: 22 mg/dL (ref 5–40)

## 2020-01-15 LAB — HGB A1C W/O EAG: Hgb A1c MFr Bld: 6 % — ABNORMAL HIGH (ref 4.8–5.6)

## 2020-01-24 NOTE — Progress Notes (Signed)
Review at visit 11/29

## 2020-01-26 ENCOUNTER — Other Ambulatory Visit: Payer: Self-pay

## 2020-01-26 ENCOUNTER — Encounter: Payer: Self-pay | Admitting: Nurse Practitioner

## 2020-01-26 ENCOUNTER — Ambulatory Visit (INDEPENDENT_AMBULATORY_CARE_PROVIDER_SITE_OTHER): Payer: Medicare Other | Admitting: Nurse Practitioner

## 2020-01-26 VITALS — BP 144/88 | HR 97 | Temp 97.5°F | Resp 16 | Ht 66.0 in | Wt 184.0 lb

## 2020-01-26 DIAGNOSIS — N959 Unspecified menopausal and perimenopausal disorder: Secondary | ICD-10-CM | POA: Diagnosis not present

## 2020-01-26 DIAGNOSIS — I10 Essential (primary) hypertension: Secondary | ICD-10-CM

## 2020-01-26 DIAGNOSIS — K581 Irritable bowel syndrome with constipation: Secondary | ICD-10-CM | POA: Diagnosis not present

## 2020-01-26 DIAGNOSIS — E782 Mixed hyperlipidemia: Secondary | ICD-10-CM | POA: Diagnosis not present

## 2020-01-26 MED ORDER — LUBIPROSTONE 8 MCG PO CAPS: 8.0000 ug | ORAL_CAPSULE | Freq: Two times a day (BID) | ORAL | 3 refills | Status: DC

## 2020-01-26 MED ORDER — VENLAFAXINE HCL ER 75 MG PO CP24: 75.0000 mg | ORAL_CAPSULE | Freq: Every day | ORAL | 1 refills | Status: DC

## 2020-01-26 NOTE — Progress Notes (Signed)
[facility] [place], [place] [address]  Internal MEDICINE  Office Visit Note  Patient Name: Virginia Phillips  [id number]  [id number]  Date of Service: 02/28/2020  Chief Complaint  Patient presents with  . Follow-up  . Gastroesophageal Reflux  . Hypertension  . Constipation    tried a few things but doesnt work    The patient is here for follow up visit. She has had cataract surgery since her last visit. Now has new glasses. Vision has improved some. Tolerated the procedure well.  She states that she has problems with constipation. This was especially bad while she was travelling. She has tried OTC milk of magnesia, fleets glycerin suppository, prune juice, omega 3 fatty oils, CoQ10, and probiotics. She states that none of these have really helped. She states that she has always had trouble with IBS with constipation. She reports bloating and weight gain with constipation. She denies abdominal pain. She did have one trial of Linzess 229mg daily. She states that this did help for a few days, but then developed severe diarrhea to the point she became dehydrated and lost consciousness.  She had routine, fasting labs done. Blood sugar was 122 with HgbA1c being 6.0. he total and ldl were mildly elevated.  Her HDL/LDL ratio was 1.6.       Current Medication: Outpatient Encounter Medications as of 01/26/2020  Medication Sig  . aspirin 81 MG tablet Take 162 mg by mouth daily at 2 PM.   . aspirin-acetaminophen-caffeine (EXCEDRIN MIGRAINE) 250-250-65 MG tablet Take 2 tablets by mouth daily as needed for headache or migraine.   .Marland Kitchenaspirin-sod bicarb-citric acid (ALKA-SELTZER) 325 MG TBEF tablet Take 650 mg by mouth every 6 (six) hours as needed (indigestion).  . Biotin (BIOTIN 5000) 5 MG CAPS Take 5,000 mg by mouth daily at 2 PM.  . BLACK CURRANT SEED OIL PO Take 1 capsule by mouth daily at 2 PM.  . Cholecalciferol (VITAMIN D) 2000 UNITS CAPS Take 2,000 Units by mouth  daily at 2 PM.   . Coenzyme Q10 (CO Q10) 200 MG CAPS Take 200 mg by mouth daily at 2 PM.   . ibuprofen (ADVIL,MOTRIN) 200 MG tablet Take 400 mg by mouth daily as needed for headache or moderate pain.   . Lancets (ONETOUCH ULTRASOFT) lancets Check sugar once daily DX 73.9 needs for one touch ultra 2 lancets  . Misc Natural Products (PETADOLEX 50) 50 MG CAPS Take 100 mg by mouth 2 (two) times daily at 10 AM and 5 PM.  . ONE TOUCH ULTRA TEST test strip CHECK BLOOD SUGAR ONCE DAILY AS DIRECTED  . polyethylene glycol (MIRALAX / GLYCOLAX) packet Take 17 g by mouth daily.   . Polyvinyl Alcohol-Povidone (REFRESH OP) Place 1 drop into both eyes daily as needed (dry eyes).  . pyridOXINE (VITAMIN B-6) 100 MG tablet Take 100 mg by mouth daily at 2 PM.   . Riboflavin (B2) 100 MG TABS Take 100 mg by mouth 2 (two) times daily.  . SUPER B COMPLEX/C PO Take 1 tablet by mouth daily at 2 PM.  . Thiamine HCl (VITAMIN B-1) 250 MG tablet Take 250 mg by mouth daily at 2 PM.  . traMADol (ULTRAM) 50 MG tablet Take 1 tablet (50 mg total) by mouth every 6 (six) hours as needed.  . valsartan (DIOVAN) 80 MG tablet Take 80 mg by mouth daily at 2 PM.   . venlafaxine XR (EFFEXOR-XR) 75 MG 24 hr capsule Take 1  capsule (75 mg total) by mouth daily at 2 PM.  . vitamin B-12 (CYANOCOBALAMIN) 1000 MCG tablet Take 1,000 mcg by mouth daily at 2 PM.  . vitamin C (ASCORBIC ACID) 500 MG tablet Take 500 mg by mouth daily at 2 PM.  . [DISCONTINUED] venlafaxine XR (EFFEXOR-XR) 75 MG 24 hr capsule Take 75 mg by mouth daily at 2 PM.  . [DISCONTINUED] lubiprostone (AMITIZA) 8 MCG capsule Take 1 capsule (8 mcg total) by mouth 2 (two) times daily with a meal.   No facility-administered encounter medications on file as of 01/26/2020.    Surgical History: Past Surgical History:  Procedure Laterality Date  . ANKLE FRACTURE SURGERY Right 2007  . COLONOSCOPY WITH PROPOFOL N/A 05/10/2017   Procedure: COLONOSCOPY WITH PROPOFOL;  Surgeon:  [name], MD;  Location: [facility] ENDOSCOPY;  Service: Endoscopy;  Laterality: N/A;  . ESOPHAGOGASTRODUODENOSCOPY (EGD) WITH PROPOFOL N/A 10/17/2017   Procedure: ESOPHAGOGASTRODUODENOSCOPY (EGD) WITH PROPOFOL;  Surgeon: [name], MD;  Location: [facility] ENDOSCOPY;  Service: Endoscopy;  Laterality: N/A;  . GANGLION CYST EXCISION Right    wrist  . HERNIA REPAIR     2011  . KNEE ARTHROSCOPY W/ ACL RECONSTRUCTION Right 2008  . KNEE SURGERY  08/05/2007  . LASIK    . NISSEN FUNDOPLICATION  3300    Medical History: Past Medical History:  Diagnosis Date  . Coronary artery disease   . GERD (gastroesophageal reflux disease)   . Glaucoma   . Headache    migraines  . History of shingles   . Hypertension   . Pre-diabetes   . Pulmonary embolism on right [facility]) 2007   right lung  . Sjogren's syndrome ([place])   . Stroke [facility]) 1998    Family History: Family History  Problem Relation Age of Onset  . Congestive Heart Failure Mother   . Parkinson's disease Mother   . Diabetes Father   . Scleroderma Sister   . Cancer Brother     Social History   Socioeconomic History  . Marital status: Married    Spouse name: Not on file  . Number of children: 1  . Years of education: Not on file  . Highest education level: Some college, no degree  Occupational History  . Occupation: retired  Tobacco Use  . Smoking status: Never Smoker  . Smokeless tobacco: Never Used  Vaping Use  . Vaping Use: Never used  Substance and Sexual Activity  . Alcohol use: No    Alcohol/week: 0.0 standard drinks  . Drug use: No  . Sexual activity: Not on file  Other Topics Concern  . Not on file  Social History Narrative  . Not on file   Social Determinants of Health   Financial Resource Strain: Not on file  Food Insecurity: Not on file  Transportation Needs: Not on file  Physical Activity: Not on file  Stress: Not on file  Social Connections: Not on file  Intimate Partner Violence: Not on  file      Review of Systems  Constitutional: Negative for activity change, chills, fatigue and unexpected weight change.  HENT: Negative for congestion, postnasal drip, rhinorrhea, sneezing and sore throat.   Respiratory: Negative for cough, chest tightness and shortness of breath.   Cardiovascular: Negative for chest pain and palpitations.  Gastrointestinal: Negative for abdominal pain, constipation, diarrhea, nausea and vomiting.  Endocrine:       Blood sugars are well managed without medication  Genitourinary: Negative for dysuria and frequency.  Musculoskeletal: Negative  for arthralgias, back pain, joint swelling and neck pain.  Skin: Negative for rash.  Neurological: Negative.  Negative for tremors and numbness.  Hematological: Negative for adenopathy. Does not bruise/bleed easily.  Psychiatric/Behavioral: Negative for behavioral problems (Depression), sleep disturbance and suicidal ideas. The patient is not nervous/anxious.     Today's Vitals   01/26/20 1502  BP: (!) 144/88  Pulse: 97  Resp: 16  Temp: (!) 97.5 F (36.4 C)  SpO2: 96%  Weight: 184 lb (83.5 kg)  Height: 5' 6"  (1.676 m)   Body mass index is 29.7 kg/m.  Physical Exam Vitals and nursing note reviewed.  Constitutional:      General: She is not in acute distress.    Appearance: Normal appearance. She is well-developed and well-nourished. She is not diaphoretic.  HENT:     Head: Normocephalic and atraumatic.     Nose: Nose normal.     Mouth/Throat:     Mouth: Oropharynx is clear and moist.     Pharynx: No oropharyngeal exudate.  Eyes:     Extraocular Movements: EOM normal.     Pupils: Pupils are equal, round, and reactive to light.  Neck:     Thyroid: No thyromegaly.     Vascular: No carotid bruit or JVD.     Trachea: No tracheal deviation.  Cardiovascular:     Rate and Rhythm: Normal rate and regular rhythm.     Heart sounds: Normal heart sounds. No murmur heard. No friction rub. No gallop.    Pulmonary:     Effort: Pulmonary effort is normal. No respiratory distress.     Breath sounds: Normal breath sounds. No wheezing or rales.  Chest:     Chest wall: No tenderness.  Abdominal:     Palpations: Abdomen is soft.  Musculoskeletal:        General: Normal range of motion.     Cervical back: Normal range of motion and neck supple.  Lymphadenopathy:     Cervical: No cervical adenopathy.  Skin:    General: Skin is warm and dry.     Capillary Refill: Capillary refill takes less than 2 seconds.  Neurological:     General: No focal deficit present.     Mental Status: She is alert and oriented to person, place, and time.     Cranial Nerves: No cranial nerve deficit.  Psychiatric:        Mood and Affect: Mood and affect and mood normal.        Behavior: Behavior normal.        Thought Content: Thought content normal.        Judgment: Judgment normal.    Assessment/Plan: 1. Benign essential HTN Generally stable. Continue losartan as prescribed.  2. Mixed hyperlipidemia Reviewed labs with mild elevation of lipid panel. Prudent diet reviewed and written information provided.   3. Irritable bowel syndrome with constipation Trial of amitiza 90mg twice daily. Advised she have proper intake of water and fiber.   4. Menopausal and postmenopausal disorder May continue effexor XR 745mdaily. Refills provided today.  - venlafaxine XR (EFFEXOR-XR) 75 MG 24 hr capsule; Take 1 capsule (75 mg total) by mouth daily at 2 PM.  Dispense: 90 capsule; Refill: 1  General Counseling: Virginia Phillips verbalizes understanding of the findings of todays visit and agrees with plan of treatment. I have discussed any further diagnostic evaluation that may be needed or ordered today. We also reviewed her medications today. she has been encouraged to call the office  with any questions or concerns that should arise related to todays visit.   This patient was seen by [place] with Dr Lavera Guise as a part of collaborative care agreement  Meds ordered this encounter  Medications  . DISCONTD: lubiprostone (AMITIZA) 8 MCG capsule    Sig: Take 1 capsule (8 mcg total) by mouth 2 (two) times daily with a meal.    Dispense:  60 capsule    Refill:  3    has tried OTC milk of magnesia, fleets glycerin suppository, prune juice, omega 3 fatty oils, CoQ10, and probiotics.    Order Specific Question:   Supervising Provider    Answer:   Lavera Guise [[id number]]  . venlafaxine XR (EFFEXOR-XR) 75 MG 24 hr capsule    Sig: Take 1 capsule (75 mg total) by mouth daily at 2 PM.    Dispense:  90 capsule    Refill:  1    Order Specific Question:   Supervising Provider    Answer:   Lavera Guise [[id number]]    Total time spent: 30 Minutes   Time spent includes review of chart, medications, test results, and follow up plan with the patient.      Dr Lavera Guise Internal medicine

## 2020-01-28 ENCOUNTER — Telehealth: Payer: Self-pay

## 2020-01-28 DIAGNOSIS — K581 Irritable bowel syndrome with constipation: Secondary | ICD-10-CM

## 2020-01-28 DIAGNOSIS — Z1231 Encounter for screening mammogram for malignant neoplasm of breast: Secondary | ICD-10-CM | POA: Diagnosis not present

## 2020-01-28 MED ORDER — LUBIPROSTONE 8 MCG PO CAPS: 8.0000 ug | ORAL_CAPSULE | Freq: Two times a day (BID) | ORAL | 3 refills | Status: DC

## 2020-01-28 NOTE — Telephone Encounter (Signed)
PA for LUBIPROSTONE CAP 8 mcg was approved on 01/28/2020 til 02/26/2021.  Pt informed and new rx sent to pharmacy

## 2020-02-10 DIAGNOSIS — E782 Mixed hyperlipidemia: Secondary | ICD-10-CM | POA: Diagnosis not present

## 2020-02-10 DIAGNOSIS — I639 Cerebral infarction, unspecified: Secondary | ICD-10-CM | POA: Diagnosis not present

## 2020-02-10 DIAGNOSIS — I779 Disorder of arteries and arterioles, unspecified: Secondary | ICD-10-CM | POA: Diagnosis not present

## 2020-02-10 DIAGNOSIS — I10 Essential (primary) hypertension: Secondary | ICD-10-CM | POA: Diagnosis not present

## 2020-02-13 DIAGNOSIS — R928 Other abnormal and inconclusive findings on diagnostic imaging of breast: Secondary | ICD-10-CM | POA: Diagnosis not present

## 2020-02-26 ENCOUNTER — Ambulatory Visit: Payer: Medicare Other | Admitting: Nurse Practitioner

## 2020-02-28 DIAGNOSIS — N959 Unspecified menopausal and perimenopausal disorder: Secondary | ICD-10-CM | POA: Insufficient documentation

## 2020-04-02 DIAGNOSIS — H2101 Hyphema, right eye: Secondary | ICD-10-CM | POA: Diagnosis not present

## 2020-04-02 DIAGNOSIS — H401134 Primary open-angle glaucoma, bilateral, indeterminate stage: Secondary | ICD-10-CM | POA: Diagnosis not present

## 2020-04-02 DIAGNOSIS — H16223 Keratoconjunctivitis sicca, not specified as Sjogren's, bilateral: Secondary | ICD-10-CM | POA: Diagnosis not present

## 2020-04-06 ENCOUNTER — Telehealth: Payer: Self-pay

## 2020-04-06 DIAGNOSIS — H401134 Primary open-angle glaucoma, bilateral, indeterminate stage: Secondary | ICD-10-CM | POA: Diagnosis not present

## 2020-04-06 DIAGNOSIS — H2101 Hyphema, right eye: Secondary | ICD-10-CM | POA: Diagnosis not present

## 2020-04-06 DIAGNOSIS — H16223 Keratoconjunctivitis sicca, not specified as Sjogren's, bilateral: Secondary | ICD-10-CM | POA: Diagnosis not present

## 2020-04-06 NOTE — Telephone Encounter (Signed)
LMOM to reschedule Friday afternoon appt. 09-24-20

## 2020-04-26 ENCOUNTER — Ambulatory Visit: Payer: Medicare Other | Admitting: Physician Assistant

## 2020-05-03 ENCOUNTER — Ambulatory Visit (INDEPENDENT_AMBULATORY_CARE_PROVIDER_SITE_OTHER): Payer: Medicare Other | Admitting: Hospice and Palliative Medicine

## 2020-05-03 ENCOUNTER — Other Ambulatory Visit: Payer: Self-pay

## 2020-05-03 ENCOUNTER — Encounter: Payer: Self-pay | Admitting: Hospice and Palliative Medicine

## 2020-05-03 VITALS — BP 108/70 | HR 88 | Temp 98.5°F | Resp 16 | Ht 66.5 in | Wt 185.4 lb

## 2020-05-03 DIAGNOSIS — I10 Essential (primary) hypertension: Secondary | ICD-10-CM

## 2020-05-03 DIAGNOSIS — M79605 Pain in left leg: Secondary | ICD-10-CM

## 2020-05-03 DIAGNOSIS — M79604 Pain in right leg: Secondary | ICD-10-CM | POA: Diagnosis not present

## 2020-05-03 DIAGNOSIS — K581 Irritable bowel syndrome with constipation: Secondary | ICD-10-CM

## 2020-05-03 NOTE — Progress Notes (Signed)
[facility], [place] [address]  Internal MEDICINE  Office Visit Note  Patient Name: Virginia Phillips  [id number]  [id number]  Date of Service: 05/04/2020  Chief Complaint  Patient presents with  . Irritable Bowel Syndrome    3 month fup.  Pt was given medication to help go to bathroom and it made knots in her stomach.  She has started on a gluten free diet and it seems to be helping    HPI Patient is here for routine follow-up Started on Amitiza at last visit for chronic constipation, was unable to tolerate as medication caused severe abdominal cramping Has recently made significant changes to her diet--incorporating more fresh fruits and vegetables and avoiding gluten Continues to complain of bilateral lower extremity pain and burning--explains this has been an ongoing issue for many years Has frequent massages which helps relieve the pain  Current Medication: Outpatient Encounter Medications as of 05/03/2020  Medication Sig  . aspirin 81 MG tablet Take 162 mg by mouth daily at 2 PM.   . aspirin-acetaminophen-caffeine (EXCEDRIN MIGRAINE) 250-250-65 MG tablet Take 2 tablets by mouth daily as needed for headache or migraine.   Marland Kitchen aspirin-sod bicarb-citric acid (ALKA-SELTZER) 325 MG TBEF tablet Take 650 mg by mouth every 6 (six) hours as needed (indigestion).  . Biotin 5 MG CAPS Take 5,000 mg by mouth daily at 2 PM.  . BLACK CURRANT SEED OIL PO Take 1 capsule by mouth daily at 2 PM.  . Cholecalciferol (VITAMIN D) 2000 UNITS CAPS Take 2,000 Units by mouth daily at 2 PM.   . Coenzyme Q10 (CO Q10) 200 MG CAPS Take 200 mg by mouth daily at 2 PM.   . ibuprofen (ADVIL,MOTRIN) 200 MG tablet Take 400 mg by mouth daily as needed for headache or moderate pain.   . Lancets (ONETOUCH ULTRASOFT) lancets Check sugar once daily DX 73.9 needs for one touch ultra 2 lancets  . Misc Natural Products (PETADOLEX 50) 50 MG CAPS Take 100 mg by mouth 2 (two) times daily at 10 AM  and 5 PM.  . ONE TOUCH ULTRA TEST test strip CHECK BLOOD SUGAR ONCE DAILY AS DIRECTED  . polyethylene glycol (MIRALAX / GLYCOLAX) packet Take 17 g by mouth daily.   . Polyvinyl Alcohol-Povidone (REFRESH OP) Place 1 drop into both eyes daily as needed (dry eyes).  . pyridOXINE (VITAMIN B-6) 100 MG tablet Take 100 mg by mouth daily at 2 PM.   . Riboflavin (B2) 100 MG TABS Take 100 mg by mouth 2 (two) times daily.  . SUPER B COMPLEX/C PO Take 1 tablet by mouth daily at 2 PM.  . Thiamine HCl (VITAMIN B-1) 250 MG tablet Take 250 mg by mouth daily at 2 PM.  . traMADol (ULTRAM) 50 MG tablet Take 1 tablet (50 mg total) by mouth every 6 (six) hours as needed.  . valsartan (DIOVAN) 80 MG tablet Take 80 mg by mouth daily at 2 PM.   . venlafaxine XR (EFFEXOR-XR) 75 MG 24 hr capsule Take 1 capsule (75 mg total) by mouth daily at 2 PM.  . vitamin B-12 (CYANOCOBALAMIN) 1000 MCG tablet Take 1,000 mcg by mouth daily at 2 PM.  . vitamin C (ASCORBIC ACID) 500 MG tablet Take 500 mg by mouth daily at 2 PM.  . [DISCONTINUED] lubiprostone (AMITIZA) 8 MCG capsule Take 1 capsule (8 mcg total) by mouth 2 (two) times daily with a meal.   No facility-administered encounter medications on file as  of 05/03/2020.    Surgical History: Past Surgical History:  Procedure Laterality Date  . ANKLE FRACTURE SURGERY Right 2007  . COLONOSCOPY WITH PROPOFOL N/A 05/10/2017   Procedure: COLONOSCOPY WITH PROPOFOL;  Surgeon: [name], MD;  Location: [facility] ENDOSCOPY;  Service: Endoscopy;  Laterality: N/A;  . ESOPHAGOGASTRODUODENOSCOPY (EGD) WITH PROPOFOL N/A 10/17/2017   Procedure: ESOPHAGOGASTRODUODENOSCOPY (EGD) WITH PROPOFOL;  Surgeon: [name], MD;  Location: [facility] ENDOSCOPY;  Service: Endoscopy;  Laterality: N/A;  . GANGLION CYST EXCISION Right    wrist  . HERNIA REPAIR     2011  . KNEE ARTHROSCOPY W/ ACL RECONSTRUCTION Right 2008  . KNEE SURGERY  08/05/2007  . LASIK    . NISSEN FUNDOPLICATION  1025     Medical History: Past Medical History:  Diagnosis Date  . Coronary artery disease   . GERD (gastroesophageal reflux disease)   . Glaucoma   . Headache    migraines  . History of shingles   . Hypertension   . Pre-diabetes   . Pulmonary embolism on right [facility]) 2007   right lung  . Sjogren's syndrome ([place])   . Stroke [facility]) 1998    Family History: Family History  Problem Relation Age of Onset  . Congestive Heart Failure Mother   . Parkinson's disease Mother   . Diabetes Father   . Scleroderma Sister   . Cancer Brother     Social History   Socioeconomic History  . Marital status: Married    Spouse name: Not on file  . Number of children: 1  . Years of education: Not on file  . Highest education level: Some college, no degree  Occupational History  . Occupation: retired  Tobacco Use  . Smoking status: Never Smoker  . Smokeless tobacco: Never Used  Vaping Use  . Vaping Use: Never used  Substance and Sexual Activity  . Alcohol use: No    Alcohol/week: 0.0 standard drinks  . Drug use: No  . Sexual activity: Not on file  Other Topics Concern  . Not on file  Social History Narrative  . Not on file   Social Determinants of Health   Financial Resource Strain: Not on file  Food Insecurity: Not on file  Transportation Needs: Not on file  Physical Activity: Not on file  Stress: Not on file  Social Connections: Not on file  Intimate Partner Violence: Not on file      Review of Systems  Constitutional: Negative for chills, diaphoresis and fatigue.  HENT: Negative for ear pain, postnasal drip and sinus pressure.   Eyes: Negative for photophobia, discharge, redness, itching and visual disturbance.  Respiratory: Negative for cough, shortness of breath and wheezing.   Cardiovascular: Negative for chest pain, palpitations and leg swelling.  Gastrointestinal: Negative for abdominal pain, constipation, diarrhea, nausea and vomiting.  Genitourinary: Negative for  dysuria and flank pain.  Musculoskeletal: Negative for arthralgias, back pain, gait problem and neck pain.       Bilateral lower extremity pain and burning  Skin: Negative for color change.  Allergic/Immunologic: Negative for environmental allergies and food allergies.  Neurological: Negative for dizziness and headaches.  Hematological: Does not bruise/bleed easily.  Psychiatric/Behavioral: Negative for agitation, behavioral problems (depression) and hallucinations.    Vital Signs: BP 108/70   Pulse 88   Temp 98.5 F (36.9 C)   Resp 16   Ht 5' 6.5" (1.689 m)   Wt 185 lb 6.4 oz (84.1 kg)   SpO2 98%   BMI  29.48 kg/m    Physical Exam Vitals reviewed.  Constitutional:      Appearance: Normal appearance. She is normal weight.  Cardiovascular:     Rate and Rhythm: Normal rate and regular rhythm.     Pulses: Normal pulses.     Heart sounds: Normal heart sounds.  Pulmonary:     Effort: Pulmonary effort is normal.     Breath sounds: Normal breath sounds.  Abdominal:     General: Abdomen is flat.  Musculoskeletal:     Cervical back: Normal range of motion.     Right lower leg: Edema present.     Left lower leg: Edema present.  Skin:    General: Skin is warm.  Neurological:     General: No focal deficit present.     Mental Status: She is alert and oriented to person, place, and time. Mental status is at baseline.  Psychiatric:        Mood and Affect: Mood normal.        Behavior: Behavior normal.        Thought Content: Thought content normal.        Judgment: Judgment normal.    Assessment/Plan: 1. Leg pain, bilateral Normal ABI Declines medication options as pain is controlled well with massage therapy - POCT ABI Screening Pilot No Charge  2. Irritable bowel syndrome with constipation Amitiza discontinued--continue with lifestyle modifications as symptoms remain well controlled  3. Essential hypertension BP and HR well controlled, continue to monitor  General  Counseling: Shanyn verbalizes understanding of the findings of todays visit and agrees with plan of treatment. I have discussed any further diagnostic evaluation that may be needed or ordered today. We also reviewed her medications today. she has been encouraged to call the office with any questions or concerns that should arise related to todays visit.    Orders Placed This Encounter  Procedures  . POCT ABI Screening Pilot No Charge      Time spent: 30 Minutes Time spent includes review of chart, medications, test results and follow-up plan with the patient.  This patient was seen by Theodoro Grist AGNP-C in Collaboration with Dr Lavera Guise as a part of collaborative care agreement     Tanna Furry. Sia Gabrielsen AGNP-C Internal medicine

## 2020-05-04 ENCOUNTER — Encounter: Payer: Self-pay | Admitting: Hospice and Palliative Medicine

## 2020-05-26 DIAGNOSIS — M2042 Other hammer toe(s) (acquired), left foot: Secondary | ICD-10-CM | POA: Diagnosis not present

## 2020-05-26 DIAGNOSIS — S9031XA Contusion of right foot, initial encounter: Secondary | ICD-10-CM | POA: Diagnosis not present

## 2020-05-26 DIAGNOSIS — M79671 Pain in right foot: Secondary | ICD-10-CM | POA: Diagnosis not present

## 2020-05-26 DIAGNOSIS — M2041 Other hammer toe(s) (acquired), right foot: Secondary | ICD-10-CM | POA: Diagnosis not present

## 2020-05-26 DIAGNOSIS — S90121A Contusion of right lesser toe(s) without damage to nail, initial encounter: Secondary | ICD-10-CM | POA: Diagnosis not present

## 2020-05-26 DIAGNOSIS — M216X2 Other acquired deformities of left foot: Secondary | ICD-10-CM | POA: Diagnosis not present

## 2020-05-26 DIAGNOSIS — M216X1 Other acquired deformities of right foot: Secondary | ICD-10-CM | POA: Diagnosis not present

## 2020-06-15 DIAGNOSIS — Z86718 Personal history of other venous thrombosis and embolism: Secondary | ICD-10-CM | POA: Diagnosis not present

## 2020-06-15 DIAGNOSIS — M79671 Pain in right foot: Secondary | ICD-10-CM | POA: Diagnosis not present

## 2020-06-15 DIAGNOSIS — M2041 Other hammer toe(s) (acquired), right foot: Secondary | ICD-10-CM | POA: Diagnosis not present

## 2020-06-29 ENCOUNTER — Other Ambulatory Visit: Payer: Self-pay

## 2020-06-29 ENCOUNTER — Ambulatory Visit (INDEPENDENT_AMBULATORY_CARE_PROVIDER_SITE_OTHER): Payer: Medicare Other | Admitting: Dermatology

## 2020-06-29 DIAGNOSIS — L578 Other skin changes due to chronic exposure to nonionizing radiation: Secondary | ICD-10-CM | POA: Diagnosis not present

## 2020-06-29 DIAGNOSIS — L820 Inflamed seborrheic keratosis: Secondary | ICD-10-CM

## 2020-06-29 NOTE — Progress Notes (Signed)
   Follow-Up Visit   Subjective  Virginia Phillips is a 75 y.o. female who presents for the following: Seborrheic Keratosis (Patient here today to have a bx proven SK removed from left cheek. It does itch and patient rubs it. ).  Patient accompanied by husband.   The following portions of the chart were reviewed this encounter and updated as appropriate:   Tobacco  Allergies  Meds  Problems  Med Hx  Surg Hx  Fam Hx      Review of Systems:  No other skin or systemic complaints except as noted in HPI or Assessment and Plan.  Objective  Well appearing patient in no apparent distress; mood and affect are within normal limits.  A focused examination was performed including face. Relevant physical exam findings are noted in the Assessment and Plan.  Objective  left cheek: Erythematous keratotic or waxy stuck-on papule or plaque.    Assessment & Plan  Inflamed seborrheic keratosis left cheek  Prior to procedure, discussed risks of blister formation, small wound, skin dyspigmentation, or rare scar following cryotherapy.    Destruction of lesion - left cheek  Destruction method: cryotherapy   Informed consent: discussed and consent obtained   Lesion destroyed using liquid nitrogen: Yes   Cryotherapy cycles:  2 Outcome: patient tolerated procedure well with no complications   Post-procedure details: wound care instructions given    Actinic Damage - chronic, secondary to cumulative UV radiation exposure/sun exposure over time - diffuse scaly erythematous macules with underlying dyspigmentation - Recommend daily broad spectrum sunscreen SPF 30+ to sun-exposed areas, reapply every 2 hours as needed.  - Recommend staying in the shade or wearing long sleeves, sun glasses (UVA+UVB protection) and wide brim hats (4-inch brim around the entire circumference of the hat). - Call for new or changing lesions. - Recommend taking Heliocare sun protection supplement daily in sunny weather for  additional sun protection. For maximum protection on the sunniest days, you can take up to 2 capsules of regular Heliocare OR take 1 capsule of Heliocare Ultra. For prolonged exposure (such as a full day in the sun), you can repeat your dose of the supplement 4 hours after your first dose.   Return in about 2 months (around 08/29/2020) for recheck ISK.  Graciella Belton, RMA, am acting as scribe for Forest Gleason, MD .  Documentation: I have reviewed the above documentation for accuracy and completeness, and I agree with the above.  Forest Gleason, MD

## 2020-06-29 NOTE — Patient Instructions (Addendum)
Cryotherapy Aftercare  . Wash gently with soap and water everyday.   Marland Kitchen Apply Vaseline and Band-Aid daily until healed.  Prior to procedure, discussed risks of blister formation, small wound, skin dyspigmentation, or rare scar following cryotherapy.   Recommend taking Heliocare sun protection supplement daily in sunny weather for additional sun protection. For maximum protection on the sunniest days, you can take up to 2 capsules of regular Heliocare OR take 1 capsule of Heliocare Ultra. For prolonged exposure (such as a full day in the sun), you can repeat your dose of the supplement 4 hours after your first dose. Heliocare can be purchased at [facility] or at [email or web address].   Recommend daily broad spectrum sunscreen SPF 30+ to sun-exposed areas, reapply every 2 hours as needed. Call for new or changing lesions.  Staying in the shade or wearing long sleeves, sun glasses (UVA+UVB protection) and wide brim hats (4-inch brim around the entire circumference of the hat) are also recommended for sun protection.  If you have any questions or concerns for your doctor, please call our main line at [phone] and press option 4 to reach your doctor's medical assistant. If no one answers, please leave a voicemail as directed and we will return your call as soon as possible. Messages left after 4 pm will be answered the following business day.   You may also send [place] a message via [place]. We typically respond to MyChart messages within 1-2 business days.  For prescription refills, please ask your pharmacy to contact our office. Our fax number is [phone].  If you have an urgent issue when the clinic is closed that cannot wait until the next business day, you can page your doctor at the number below.    Please note that while we do our best to be available for urgent issues outside of office hours, we are not available 24/7.   If you have an urgent issue and are unable to reach [place], you  may choose to seek medical care at your doctor's office, retail clinic, urgent care center, or emergency room.  If you have a medical emergency, please immediately call 911 or go to the emergency department.  Pager Numbers  - Dr. Nehemiah Massed: [phone]  - Dr. Laurence Ferrari: [phone]  - Dr. Nicole Kindred: [phone]  In the event of inclement weather, please call our main line at [phone] for an update on the status of any delays or closures.  Dermatology Medication Tips: Please keep the boxes that topical medications come in in order to help keep track of the instructions about where and how to use these. Pharmacies typically print the medication instructions only on the boxes and not directly on the medication tubes.   If your medication is too expensive, please contact our office at [phone] option 4 or send [place] a message through [place].   We are unable to tell what your co-pay for medications will be in advance as this is different depending on your insurance coverage. However, we may be able to find a substitute medication at lower cost or fill out paperwork to get insurance to cover a needed medication.   If a prior authorization is required to get your medication covered by your insurance company, please allow [place] 1-2 business days to complete this process.  Drug prices often vary depending on where the prescription is filled and some pharmacies may offer cheaper prices.  The website www.goodrx.com contains coupons for medications through different pharmacies. The prices here do not account  for what the cost may be with help from insurance (it may be cheaper with your insurance), but the website can give you the price if you did not use any insurance.  - You can print the associated coupon and take it with your prescription to the pharmacy.  - You may also stop by our office during regular business hours and pick up a GoodRx coupon card.  - If you need your prescription sent  electronically to a different pharmacy, notify our office through [facility] or by phone at [phone] option 4.

## 2020-07-06 ENCOUNTER — Encounter: Payer: Self-pay | Admitting: Dermatology

## 2020-07-21 DIAGNOSIS — R0602 Shortness of breath: Secondary | ICD-10-CM | POA: Diagnosis not present

## 2020-07-21 DIAGNOSIS — I779 Disorder of arteries and arterioles, unspecified: Secondary | ICD-10-CM | POA: Diagnosis not present

## 2020-07-21 DIAGNOSIS — G4733 Obstructive sleep apnea (adult) (pediatric): Secondary | ICD-10-CM | POA: Diagnosis not present

## 2020-07-21 DIAGNOSIS — I10 Essential (primary) hypertension: Secondary | ICD-10-CM | POA: Diagnosis not present

## 2020-07-21 DIAGNOSIS — E782 Mixed hyperlipidemia: Secondary | ICD-10-CM | POA: Diagnosis not present

## 2020-07-21 DIAGNOSIS — I6523 Occlusion and stenosis of bilateral carotid arteries: Secondary | ICD-10-CM | POA: Diagnosis not present

## 2020-07-22 DIAGNOSIS — H401123 Primary open-angle glaucoma, left eye, severe stage: Secondary | ICD-10-CM | POA: Diagnosis not present

## 2020-07-22 DIAGNOSIS — H16223 Keratoconjunctivitis sicca, not specified as Sjogren's, bilateral: Secondary | ICD-10-CM | POA: Diagnosis not present

## 2020-07-22 DIAGNOSIS — H401111 Primary open-angle glaucoma, right eye, mild stage: Secondary | ICD-10-CM | POA: Diagnosis not present

## 2020-08-03 ENCOUNTER — Other Ambulatory Visit: Payer: Self-pay

## 2020-08-03 ENCOUNTER — Ambulatory Visit (INDEPENDENT_AMBULATORY_CARE_PROVIDER_SITE_OTHER): Payer: Medicare Other | Admitting: Nurse Practitioner

## 2020-08-03 VITALS — BP 116/78 | HR 79 | Temp 97.3°F | Resp 16 | Ht 66.0 in | Wt 188.6 lb

## 2020-08-03 DIAGNOSIS — R5383 Other fatigue: Secondary | ICD-10-CM | POA: Diagnosis not present

## 2020-08-03 DIAGNOSIS — R300 Dysuria: Secondary | ICD-10-CM

## 2020-08-03 DIAGNOSIS — E559 Vitamin D deficiency, unspecified: Secondary | ICD-10-CM

## 2020-08-03 DIAGNOSIS — I10 Essential (primary) hypertension: Secondary | ICD-10-CM | POA: Diagnosis not present

## 2020-08-03 DIAGNOSIS — Z0001 Encounter for general adult medical examination with abnormal findings: Secondary | ICD-10-CM

## 2020-08-03 DIAGNOSIS — E782 Mixed hyperlipidemia: Secondary | ICD-10-CM

## 2020-08-03 MED ORDER — PRAVASTATIN SODIUM 10 MG PO TABS: 10.0000 mg | ORAL_TABLET | Freq: Every day | ORAL | 3 refills | Status: DC | PRN

## 2020-08-03 MED ORDER — AMLODIPINE BESYLATE 5 MG PO TABS: 1.0000 | ORAL_TABLET | Freq: Every day | ORAL | 3 refills | Status: DC

## 2020-08-03 NOTE — Progress Notes (Signed)
[facility], [place] [address]  Internal MEDICINE  Office Visit Note  Patient Name: Virginia Phillips  [id number]  [id number]  Date of Service: 08/06/2020  Chief Complaint  Patient presents with   Medicare Wellness    HPI Virginia Phillips presents for her annual Medicare wellness visit.  She has a history of gastroesophageal reflux, glaucoma, hypertension, and stroke.  She had an ischemic stroke in 1998.  On her right foot she reports a hammertoe and skeletal deformities in her second through fifth toes to which she is unable to straighten her toes.  She is seeing a podiatrist, Dr. Vickki Phillips, who previously discussed surgery to repair the toes of her right foot.  At the time she was not interested in having the surgery.  She has since gone back to see Dr. Vickki Phillips and has decided to consider the surgery again.  She reports that the surgery will be scheduled soon but most likely after the summer because she does not want to spend her summer recovering from surgery.  She reports that she has family in [place] and they are a good support system for her and she sees her 3 grandchildren regularly.  medicare wellness, stabbing pain sternal, with radiation to back. Stress related She denies any increased depression, denies any thoughts of self-harm or suicidal thoughts.  She reports that her family support helps to keep her spirits up.  She does report increased anxiety and that she has had some sternal sharp stabbing chest pains which she has noticed that they go away if she is engaging in activities that help her to relax.  She is working on increasing her focus on self-care, decreasing her stress level and not lingering on thoughts about things she cannot control. She states that she is due for her mammogram in October this year.  She recently had her colonoscopy done in 2019 which is recommended every 5 years for her so she will be due for a repeat routine colonoscopy in 2024.  She  has received the 2 doses of the COVID-vaccine +1 booster.  She is due for the second booster which she states she plans to get soon.  Current Medication: Outpatient Encounter Medications as of 08/03/2020  Medication Sig   amLODipine (NORVASC) 5 MG tablet Take 1 tablet (5 mg total) by mouth daily.   aspirin 81 MG tablet Take 162 mg by mouth daily at 2 PM.    aspirin-acetaminophen-caffeine (EXCEDRIN MIGRAINE) 250-250-65 MG tablet Take 2 tablets by mouth daily as needed for headache or migraine.    aspirin-sod bicarb-citric acid (ALKA-SELTZER) 325 MG TBEF tablet Take 650 mg by mouth every 6 (six) hours as needed (indigestion).   Biotin 5 MG CAPS Take 5,000 mg by mouth daily at 2 PM.   BLACK CURRANT SEED OIL PO Take 1 capsule by mouth daily at 2 PM.   Cholecalciferol (VITAMIN D) 2000 UNITS CAPS Take 2,000 Units by mouth daily at 2 PM.    Coenzyme Q10 (CO Q10) 200 MG CAPS Take 200 mg by mouth daily at 2 PM.    ibuprofen (ADVIL,MOTRIN) 200 MG tablet Take 400 mg by mouth daily as needed for headache or moderate pain.    Lancets (ONETOUCH ULTRASOFT) lancets Check sugar once daily DX 73.9 needs for one touch ultra 2 lancets   Misc Natural Products (PETADOLEX 50) 50 MG CAPS Take 100 mg by mouth 2 (two) times daily at 10 AM and 5 PM.   ONE TOUCH ULTRA  TEST test strip CHECK BLOOD SUGAR ONCE DAILY AS DIRECTED   polyethylene glycol (MIRALAX / GLYCOLAX) packet Take 17 g by mouth daily.    Polyvinyl Alcohol-Povidone (REFRESH OP) Place 1 drop into both eyes daily as needed (dry eyes).   pravastatin (PRAVACHOL) 10 MG tablet Take 1 tablet (10 mg total) by mouth daily as needed.   pyridOXINE (VITAMIN B-6) 100 MG tablet Take 100 mg by mouth daily at 2 PM.    Riboflavin (B2) 100 MG TABS Take 100 mg by mouth 2 (two) times daily.   SUPER B COMPLEX/C PO Take 1 tablet by mouth daily at 2 PM.   Thiamine HCl (VITAMIN B-1) 250 MG tablet Take 250 mg by mouth daily at 2 PM.   traMADol (ULTRAM) 50 MG tablet Take 1 tablet (50  mg total) by mouth every 6 (six) hours as needed.   valsartan (DIOVAN) 80 MG tablet Take 80 mg by mouth daily at 2 PM.    venlafaxine XR (EFFEXOR-XR) 75 MG 24 hr capsule Take 1 capsule (75 mg total) by mouth daily at 2 PM.   vitamin B-12 (CYANOCOBALAMIN) 1000 MCG tablet Take 1,000 mcg by mouth daily at 2 PM.   vitamin C (ASCORBIC ACID) 500 MG tablet Take 500 mg by mouth daily at 2 PM.   [DISCONTINUED] amLODipine (NORVASC) 5 MG tablet Take 1 tablet by mouth daily.   [DISCONTINUED] pravastatin (PRAVACHOL) 10 MG tablet Take 10 mg by mouth daily as needed.   No facility-administered encounter medications on file as of 08/03/2020.    Surgical History: Past Surgical History:  Procedure Laterality Date   ANKLE FRACTURE SURGERY Right 2007   COLONOSCOPY WITH PROPOFOL N/A 05/10/2017   Procedure: COLONOSCOPY WITH PROPOFOL;  Surgeon: Virginia Manifold, MD;  Location: [facility] ENDOSCOPY;  Service: Endoscopy;  Laterality: N/A;   ESOPHAGOGASTRODUODENOSCOPY (EGD) WITH PROPOFOL N/A 10/17/2017   Procedure: ESOPHAGOGASTRODUODENOSCOPY (EGD) WITH PROPOFOL;  Surgeon: Virginia Manifold, MD;  Location: [facility] ENDOSCOPY;  Service: Endoscopy;  Laterality: N/A;   GANGLION CYST EXCISION Right    wrist   HERNIA REPAIR     2011   KNEE ARTHROSCOPY W/ ACL RECONSTRUCTION Right 2008   KNEE SURGERY  08/05/2007   LASIK     NISSEN FUNDOPLICATION  [id number]    Medical History: Past Medical History:  Diagnosis Date   Coronary artery disease    GERD (gastroesophageal reflux disease)    Glaucoma    Headache    migraines   History of shingles    Hypertension    Pre-diabetes    Pulmonary embolism on right [facility]) 2007   right lung   Sjogren's syndrome ([place])    Stroke ([place]) 1998    Family History: Family History  Problem Relation Age of Onset   Congestive Heart Failure Mother    Parkinson's disease Mother    Diabetes Father    Scleroderma Sister    Cancer Brother     Social History   Socioeconomic History    Marital status: Married    Spouse name: Not on file   Number of children: 1   Years of education: Not on file   Highest education level: Some college, no degree  Occupational History   Occupation: retired  Tobacco Use   Smoking status: Never   Smokeless tobacco: Never  Vaping Use   Vaping Use: Never used  Substance and Sexual Activity   Alcohol use: No    Alcohol/week: 0.0 standard drinks   Drug use: No  Sexual activity: Not on file  Other Topics Concern   Not on file  Social History Narrative   Not on file   Social Determinants of Health   Financial Resource Strain: Not on file  Food Insecurity: Not on file  Transportation Needs: Not on file  Physical Activity: Not on file  Stress: Not on file  Social Connections: Not on file  Intimate Partner Violence: Not on file      Review of Systems  Constitutional:  Positive for fatigue.  HENT:  Positive for ear pain.   Respiratory:  Positive for cough (slight) and shortness of breath.   Cardiovascular:  Positive for chest pain (chest pain stress realted, noncardiac).  Gastrointestinal:  Positive for constipation (miralax).  Genitourinary: Negative.   Musculoskeletal:  Positive for arthralgias.  Neurological:  Positive for light-headedness.  Psychiatric/Behavioral:  The patient is nervous/anxious.    Vital Signs: BP 116/78   Pulse 79   Temp (!) 97.3 F (36.3 C)   Resp 16   Ht 5' 6"  (1.676 m)   Wt 188 lb 9.6 oz (85.5 kg)   SpO2 98%   BMI 30.44 kg/m    Physical Exam Vitals reviewed.  Constitutional:      General: She is not in acute distress.    Appearance: She is well-developed. She is not diaphoretic.  HENT:     Head: Normocephalic and atraumatic.     Mouth/Throat:     Pharynx: No oropharyngeal exudate.  Eyes:     Pupils: Pupils are equal, round, and reactive to light.  Neck:     Thyroid: No thyromegaly.     Vascular: No JVD.     Trachea: No tracheal deviation.  Cardiovascular:     Rate and Rhythm:  Normal rate and regular rhythm.     Heart sounds: Normal heart sounds. No murmur heard.   No friction rub. No gallop.  Pulmonary:     Effort: Pulmonary effort is normal. No respiratory distress.     Breath sounds: No wheezing or rales.  Chest:     Chest wall: No tenderness.  Abdominal:     General: Bowel sounds are normal.     Palpations: Abdomen is soft.  Musculoskeletal:        General: Normal range of motion.     Cervical back: Normal range of motion and neck supple.     Right foot: Bunion and prominent metatarsal heads present.     Left foot: Bunion present.  Feet:     Right foot:     Toenail Condition: Right toenails are normal.     Left foot:     Toenail Condition: Left toenails are normal.     Comments: Hammer toe on right foot.  Lymphadenopathy:     Cervical: No cervical adenopathy.  Skin:    General: Skin is warm and dry.  Neurological:     Mental Status: She is alert and oriented to person, place, and time.     Cranial Nerves: No cranial nerve deficit.  Psychiatric:        Behavior: Behavior normal.        Thought Content: Thought content normal.        Judgment: Judgment normal.     Assessment/Plan: 1. Encounter for general adult medical examination with abnormal findings Age-appropriate preventive screenings discussed, annual physical exam completed.   2. Other fatigue Patient complaints of fatigue labs ordered to evaluate possible causes. - CBC with Differential/Platelet - CMP14+EGFR - TSH + free  T4  3. Essential hypertension Blood pressure is well controlled with current medications, amlodipine refill ordered. - amLODipine (NORVASC) 5 MG tablet; Take 1 tablet (5 mg total) by mouth daily.  Dispense: 90 tablet; Refill: 3  4. Mixed hyperlipidemia Patient has a history of hyperlipidemia she currently takes pravastatin, refill ordered.  Lipid panel ordered to evaluate current cholesterol levels. - pravastatin (PRAVACHOL) 10 MG tablet; Take 1 tablet (10 mg  total) by mouth daily as needed.  Dispense: 90 tablet; Refill: 3 - Lipid Profile  5. Vitamin D deficiency Patient has a history of vitamin D deficiency, vitamin D level ordered. - Vitamin D (25 hydroxy)  6. Dysuria Routine urinalysis done. - UA/M w/rflx Culture, Routine   General Counseling: Nattaly verbalizes understanding of the findings of todays visit and agrees with plan of treatment. I have discussed any further diagnostic evaluation that may be needed or ordered today. We also reviewed her medications today. she has been encouraged to call the office with any questions or concerns that should arise related to todays visit.    Orders Placed This Encounter  Procedures   UA/M w/rflx Culture, Routine   CBC with Differential/Platelet   CMP14+EGFR   Lipid Profile   TSH + free T4   Vitamin D (25 hydroxy)    Meds ordered this encounter  Medications   amLODipine (NORVASC) 5 MG tablet    Sig: Take 1 tablet (5 mg total) by mouth daily.    Dispense:  90 tablet    Refill:  3   pravastatin (PRAVACHOL) 10 MG tablet    Sig: Take 1 tablet (10 mg total) by mouth daily as needed.    Dispense:  90 tablet    Refill:  3   Return in about 3 months (around 11/03/2020) for F/U, med refill, Kristalyn Bergstresser PCP.  Total time spent:30 minutes Time spent includes review of chart, medications, test results, and follow up plan with the patient.   [place] Controlled Substance Database was reviewed by me.  This patient was seen by Jonetta Osgood, FNP-C in collaboration with Dr. Clayborn Bigness as a part of collaborative care agreement.  Chrishaun Sasso R. Valetta Fuller, MSN, FNP-C Internal medicine

## 2020-08-05 LAB — UA/M W/RFLX CULTURE, ROUTINE

## 2020-08-12 ENCOUNTER — Other Ambulatory Visit: Payer: Self-pay

## 2020-08-12 ENCOUNTER — Encounter: Payer: Self-pay | Admitting: Cardiovascular Disease

## 2020-08-12 ENCOUNTER — Ambulatory Visit (INDEPENDENT_AMBULATORY_CARE_PROVIDER_SITE_OTHER): Payer: Medicare Other | Admitting: Cardiovascular Disease

## 2020-08-12 VITALS — BP 122/80 | HR 68 | Ht 66.0 in | Wt 186.2 lb

## 2020-08-12 DIAGNOSIS — I2510 Atherosclerotic heart disease of native coronary artery without angina pectoris: Secondary | ICD-10-CM

## 2020-08-12 DIAGNOSIS — Z01818 Encounter for other preprocedural examination: Secondary | ICD-10-CM | POA: Diagnosis not present

## 2020-08-12 DIAGNOSIS — Z01810 Encounter for preprocedural cardiovascular examination: Secondary | ICD-10-CM | POA: Diagnosis not present

## 2020-08-12 DIAGNOSIS — I209 Angina pectoris, unspecified: Secondary | ICD-10-CM | POA: Diagnosis not present

## 2020-08-12 MED ORDER — METOPROLOL TARTRATE 100 MG PO TABS: 100.0000 mg | ORAL_TABLET | Freq: Once | ORAL | 0 refills | Status: DC

## 2020-08-12 NOTE — Progress Notes (Signed)
Cardiology Office Note   Date:  08/12/2020   ID:  Lue, Sykora 12-02-1945, MRN [id number]  PCP:  Lavera Guise, MD  Cardiologist:   Ida Rogue, MD   Chief Complaint  Patient presents with   Other    Chest pain, occasional irregula HR and sob. Meds reviewed verbally with pt.      History of Present Illness: Virginia Phillips is a 75 y.o. female who presents for  Nonsmoker, second hand 3 ppd Borderline diabetes Hypertension Ankle surgery, debility Obstructive sleep apnea Carotid disease, aortic atherosclerosis per outside cardiology records not available but have been requested Presenting for new patient evaluation of her chest pain symptoms  Reports recently seen by outside cardiology, mentioned that she was having episodes of chest pain Was told that it was of no concern and she is seeking second opinion Reports havingSevere chest pain,  08/09/2020, Woke her from sleep, took several hours to go away, did not seek medical attention Stuttering chest pain since then  She does report presentation is complicated by prior history of sliding hiatal hernia, had prior surgery but she does not feel that symptoms are GI related  Not able to walk very well, uses a cane secondary to severe ankle pain  Labs reviewed  A1C 6.0 Total chol 204, LDL 113  EKG personally reviewed by myself on todays visit Shows normal sinus rhythm rate 68 bpm 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, poor R wave progression through the anterior precordial leads  Prior records reviewed Echo 03/2018 NORMAL LEFT VENTRICULAR SYSTOLIC FUNCTION   WITH MILD LVH  NORMAL RIGHT VENTRICULAR SYSTOLIC FUNCTION  MILD VALVULAR REGURGITATION (See above)  NO VALVULAR STENOSIS  TRIVIAL MR, TR  MILD PR  EF 55%    Past Medical History:  Diagnosis Date   Coronary artery disease    GERD (gastroesophageal reflux disease)    Glaucoma    Headache    migraines   History of shingles    Hyperlipidemia    Hypertension     Pre-diabetes    Pulmonary embolism on right [facility]) 2007   right lung   Sjogren's syndrome ([place])    Stroke ([place]) 1998    Past Surgical History:  Procedure Laterality Date   ANKLE FRACTURE SURGERY Right 2007   COLONOSCOPY WITH PROPOFOL N/A 05/10/2017   Procedure: COLONOSCOPY WITH PROPOFOL;  Surgeon: [name], MD;  Location: [facility] ENDOSCOPY;  Service: Endoscopy;  Laterality: N/A;   ESOPHAGOGASTRODUODENOSCOPY (EGD) WITH PROPOFOL N/A 10/17/2017   Procedure: ESOPHAGOGASTRODUODENOSCOPY (EGD) WITH PROPOFOL;  Surgeon: [name], MD;  Location: [facility] ENDOSCOPY;  Service: Endoscopy;  Laterality: N/A;   GANGLION CYST EXCISION Right    wrist   HERNIA REPAIR     2011   KNEE ARTHROSCOPY W/ ACL RECONSTRUCTION Right 2008   KNEE SURGERY  08/05/2007   LASIK     NISSEN FUNDOPLICATION  7121     Current Outpatient Medications  Medication Sig Dispense Refill   amLODipine (NORVASC) 5 MG tablet Take 1 tablet (5 mg total) by mouth daily. 90 tablet 3   aspirin 325 MG tablet Take 325 mg by mouth daily.     aspirin-acetaminophen-caffeine (EXCEDRIN MIGRAINE) 250-250-65 MG tablet Take 2 tablets by mouth daily as needed for headache or migraine.      aspirin-sod bicarb-citric acid (ALKA-SELTZER) 325 MG TBEF tablet Take 650 mg by mouth every 6 (six) hours as needed (indigestion).     Biotin 5 MG CAPS Take 5,000 mg by mouth  daily at 2 PM.     BLACK CURRANT SEED OIL PO Take 1 capsule by mouth daily at 2 PM.     Cholecalciferol (VITAMIN D) 2000 UNITS CAPS Take 2,000 Units by mouth daily at 2 PM.      Coenzyme Q10 (CO Q10) 200 MG CAPS Take 200 mg by mouth daily at 2 PM.      ibuprofen (ADVIL,MOTRIN) 200 MG tablet Take 400 mg by mouth daily as needed for headache or moderate pain.      Lancets (ONETOUCH ULTRASOFT) lancets Check sugar once daily DX 73.9 needs for one touch ultra 2 lancets 50 each 12   Misc Natural Products (PETADOLEX 50) 50 MG CAPS Take 100 mg by mouth 2 (two) times daily at 10 AM and  5 PM.     multivitamin-lutein (OCUVITE-LUTEIN) CAPS capsule Take 1 capsule by mouth daily.     ONE TOUCH ULTRA TEST test strip CHECK BLOOD SUGAR ONCE DAILY AS DIRECTED 100 each 3   polyethylene glycol (MIRALAX / GLYCOLAX) packet Take 17 g by mouth daily.      Polyvinyl Alcohol-Povidone (REFRESH OP) Place 1 drop into both eyes daily as needed (dry eyes).     pravastatin (PRAVACHOL) 10 MG tablet Take 10 mg by mouth daily.     pyridOXINE (VITAMIN B-6) 100 MG tablet Take 100 mg by mouth daily at 2 PM.      Riboflavin (B2) 100 MG TABS Take 100 mg by mouth 2 (two) times daily.     SUPER B COMPLEX/C PO Take 1 tablet by mouth daily at 2 PM.     Thiamine HCl (VITAMIN B-1) 250 MG tablet Take 250 mg by mouth daily at 2 PM.     valsartan (DIOVAN) 80 MG tablet Take 80 mg by mouth daily at 2 PM.   3   venlafaxine XR (EFFEXOR-XR) 75 MG 24 hr capsule Take 1 capsule (75 mg total) by mouth daily at 2 PM. 90 capsule 1   vitamin B-12 (CYANOCOBALAMIN) 1000 MCG tablet Take 1,000 mcg by mouth daily at 2 PM.     vitamin C (ASCORBIC ACID) 500 MG tablet Take 500 mg by mouth daily at 2 PM.     No current facility-administered medications for this visit.    Allergies:   Linzess [linaclotide], Amoxicillin, Blue dyes (parenteral), Butalbital-aspirin-caffeine, Clindamycin/lincomycin, Clobetasol, Dexlansoprazole, Diphenhydramine, Doxycycline, Esomeprazole, Gabapentin, Green dye, Iodinated diagnostic agents, Meloxicam, Metronidazole, Nortriptyline hcl, Prednisolone acetate, Tetracycline, Baby oil, Cortisone, Diphenhydramine hcl, Levofloxacin, Mineral oil, Prednisone, and Sulfa antibiotics    Social History:  The patient  reports that she has never smoked. She has never used smokeless tobacco. She reports that she does not drink alcohol and does not use drugs.   Family History:  The patient's family history includes Cancer in her brother; Congestive Heart Failure in her mother; Diabetes in her father; Parkinson's disease in  her mother; Scleroderma in her sister.    ROS:   Review of Systems  Constitutional: Negative.   HENT: Negative.    Respiratory: Negative.    Cardiovascular:  Positive for chest pain.  Gastrointestinal: Negative.   Musculoskeletal:  Positive for joint pain.  Neurological: Negative.   Psychiatric/Behavioral: Negative.    All other systems reviewed and are negative.   PHYSICAL EXAM: VS:  BP 122/80 (BP Location: Right Arm, Patient Position: Sitting, Cuff Size: Normal)   Pulse 68   Ht 5' 6"  (1.676 m)   Wt 186 lb 4 oz (84.5 kg)   SpO2 98%  BMI 30.06 kg/m  , BMI Body mass index is 30.06 kg/m. GEN: Well nourished, well developed, in no acute distress  HEENT: normal  Neck: no JVD, carotid bruits, or masses Cardiac: RRR; no murmurs, rubs, or gallops,no edema  Respiratory:  clear to auscultation bilaterally, normal work of breathing GI: soft, nontender, nondistended, + BS MS: no deformity or atrophy  Skin: warm and dry, no rash Neuro:  Strength and sensation are intact Psych: euthymic mood, full affect    Recent Labs: 01/14/2020: ALT 22; BUN 11; Creatinine, Ser 0.89; Potassium 4.9; Sodium 144    Lipid Panel    Component Value Date/Time   CHOL 204 (H) 01/14/2020 1332   CHOL 166 03/06/2012 1512   TRIG 129 01/14/2020 1332   TRIG 93 03/06/2012 1512   HDL 69 01/14/2020 1332   HDL 76 (H) 03/06/2012 1512   CHOLHDL 3.0 08/04/2019 1548   CHOLHDL 2.9 12/14/2016 1339   VLDL 19 03/06/2012 1512   LDLCALC 113 (H) 01/14/2020 1332   LDLCALC 96 12/14/2016 1339   LDLCALC 71 03/06/2012 1512      Wt Readings from Last 3 Encounters:  08/12/20 186 lb 4 oz (84.5 kg)  08/03/20 188 lb 9.6 oz (85.5 kg)  05/03/20 185 lb 6.4 oz (84.1 kg)      PAD Screen 08/12/2020  Previous PAD dx? No  Previous surgical procedure? No  Pain with walking? Yes  Subsides with rest? Yes  Feet/toe relief with dangling? No  Painful, non-healing ulcers? No  Extremities discolored? No      ASSESSMENT  AND PLAN:  1.  Chest pain/angina Risk factors including carotid disease, aortic atherosclerosis, hyperlipidemia, hypertension -Unable to treadmill, Given obesity and high likelihood of breast attenuation artifact, we will avoid stress testing with Myoview -She would prefer to avoid cardiac catheterization at this time though concerned that ischemic work-up needed -Cardiac CTA has been ordered She denies any contrast allergy This was noted in the chart but she denies it, husband also confirms no allergy She does report having an allergy to prednisone, face breaks out -We will premedicate with Benadryl alone prior to cardiac CTA  Essential hypertension Blood pressure is well controlled on today's visit. No changes made to the medications.  Hyperlipidemia Continue low-dose pravastatin, Further changes depending on cardiac CTA results   Outside records requested, reviewed for today's visit  Total encounter time more than 60 minutes  Greater than 50% was spent in counseling and coordination of care with the patient    Signed,  Ida Rogue, MD  08/12/2020 12:09 PM    Cut Off

## 2020-08-12 NOTE — Patient Instructions (Addendum)
Medication Instructions:  No changes  If you need a refill on your cardiac medications before your next appointment, please call your pharmacy.   Lab work: BMET  Testing/Procedures: Cardiac CTA  Follow-Up: At [facility], you and your health needs are our priority.  As part of our continuing mission to provide you with exceptional heart care, we have created designated Provider Care Teams.  These Care Teams include your primary Cardiologist (physician) and Advanced Practice Providers (APPs -  Physician Assistants and Nurse Practitioners) who all work together to provide you with the care you need, when you need it.  You will need a follow up appointment in 6 months with Dr. Rockey Situ  Providers on your designated Care Team:   Murray Hodgkins, NP Christell Faith, PA-C Marrianne Mood, PA-C Cadence [place], [place]  COVID-19 Vaccine Information can be found at: ShippingScam.co.uk For questions related to vaccine distribution or appointments, please email [email or web address] [email or web address] or call 4042487114.   CARDIAC CTA  [facility] [address] [place], [place] [address]   Please arrive 15 mins early for check-in and test prep.  Please follow these instructions carefully (unless otherwise directed):  On the Night Before the Test: Be sure to Drink plenty of water. Do not consume any caffeinated/decaffeinated beverages or chocolate 12 hours prior to your test. Do not take any antihistamines 12 hours prior to your test. Benadryl  On the Day of the Test: Drink plenty of water until 1 hour prior to the test. Do not eat any food 4 hours prior to the test. You may take your regular medications prior to the test.  Take metoprolol (Lopressor) two hours prior to test. Lopressor 100 mg HOLD Furosemide/Hydrochlorothiazide morning of the test. FEMALES- please wear underwire-free bra if  available      After the Test: Drink plenty of water. After receiving IV contrast, you may experience a mild flushed feeling. This is normal. On occasion, you may experience a mild rash up to 24 hours after the test. This is not dangerous. If this occurs, you can take Benadryl 25 mg and increase your fluid intake. If you experience trouble breathing, this can be serious. If it is severe call 911 IMMEDIATELY. If it is mild, please call our office. If you take any of these medications: Glipizide/Metformin, Avandament, Glucavance, please do not take 48 hours after completing test unless otherwise instructed.   Once we have confirmed authorization from your insurance company, we will call you to set up a date and time for your test. Based on how quickly your insurance processes prior authorizations requests, please allow up to 4 weeks to be contacted for scheduling your Cardiac CT appointment. Be advised that routine Cardiac CT appointments could be scheduled as many as 8 weeks after your provider has ordered it.   For scheduling needs, including cancellations and rescheduling, please call [place], [phone].  We will get carotid ultrasound from [facility] faxed over to our office

## 2020-08-13 LAB — BASIC METABOLIC PANEL
BUN/Creatinine Ratio: 19 (ref 12–28)
BUN: 16 mg/dL (ref 8–27)
CO2: 26 mmol/L (ref 20–29)
Calcium: 10 mg/dL (ref 8.7–10.3)
Chloride: 102 mmol/L (ref 96–106)
Creatinine, Ser: 0.83 mg/dL (ref 0.57–1.00)
Glucose: 107 mg/dL — ABNORMAL HIGH (ref 65–99)
Potassium: 5.5 mmol/L — ABNORMAL HIGH (ref 3.5–5.2)
Sodium: 142 mmol/L (ref 134–144)
eGFR: 74 mL/min/{1.73_m2} (ref >59)

## 2020-08-18 ENCOUNTER — Telehealth: Payer: Self-pay | Admitting: Cardiovascular Disease

## 2020-08-18 NOTE — Telephone Encounter (Signed)
Was able to reach back out to Virginia Phillips and give her directions to [facility] for her cardiac CTA, also reviewed instructions. CTA schedule at [facility] d/t possible contrast allergy.   Pt denied Contrast allergy during her appt with Dr. Rockey Situ, although listed in her med list, noted placed back in 2016, pt denies.   Pt does report allergy to prednisone.

## 2020-08-18 NOTE — Telephone Encounter (Signed)
Patient calling  Would like to know where to go for cardiac ct  Please call to discuss

## 2020-08-23 DIAGNOSIS — R5383 Other fatigue: Secondary | ICD-10-CM | POA: Diagnosis not present

## 2020-08-23 DIAGNOSIS — E559 Vitamin D deficiency, unspecified: Secondary | ICD-10-CM | POA: Diagnosis not present

## 2020-08-23 DIAGNOSIS — E782 Mixed hyperlipidemia: Secondary | ICD-10-CM | POA: Diagnosis not present

## 2020-08-24 ENCOUNTER — Telehealth (HOSPITAL_COMMUNITY): Payer: Self-pay | Admitting: Emergency Medicine

## 2020-08-24 NOTE — Telephone Encounter (Signed)
Reaching out to patient to offer assistance regarding upcoming cardiac imaging study; pt verbalizes understanding of appt date/time, parking situation and where to check in, pre-test NPO status and medications ordered, and verified current allergies; name and call back number provided for further questions should they arise Virginia Bond RN Navigator Cardiac Imaging [facility] [phone] office [phone] cell   Pt denies contrast allergy but will take PO benadryl prior to scan because she was told by [place] office to do so.   Pt taking 130m metoprolol tartrate 2 hr prior to scan  Pt reports some claustrophobia but will close her eyes during scan Virginia Phillips

## 2020-08-25 LAB — CBC WITH DIFFERENTIAL/PLATELET
Basophils Absolute: 0.1 10*3/uL (ref 0.0–0.2)
Basos: 1 %
EOS (ABSOLUTE): 0.2 10*3/uL (ref 0.0–0.4)
Eos: 2 %
Hematocrit: 45.9 % (ref 34.0–46.6)
Hemoglobin: 15.4 g/dL (ref 11.1–15.9)
Immature Grans (Abs): 0 10*3/uL (ref 0.0–0.1)
Immature Granulocytes: 0 %
Lymphocytes Absolute: 1.7 10*3/uL (ref 0.7–3.1)
Lymphs: 28 %
MCH: 31.9 pg (ref 26.6–33.0)
MCHC: 33.6 g/dL (ref 31.5–35.7)
MCV: 95 fL (ref 79–97)
Monocytes Absolute: 0.6 10*3/uL (ref 0.1–0.9)
Monocytes: 9 %
Neutrophils Absolute: 3.7 10*3/uL (ref 1.4–7.0)
Neutrophils: 60 %
Platelets: 302 10*3/uL (ref 150–450)
RBC: 4.83 x10E6/uL (ref 3.77–5.28)
RDW: 12.5 % (ref 11.7–15.4)
WBC: 6.3 10*3/uL (ref 3.4–10.8)

## 2020-08-25 LAB — CMP14+EGFR
ALT: 23 IU/L (ref 0–32)
AST: 18 IU/L (ref 0–40)
Albumin/Globulin Ratio: 1.9 (ref 1.2–2.2)
Albumin: 4.5 g/dL (ref 3.7–4.7)
Alkaline Phosphatase: 69 IU/L (ref 44–121)
BUN/Creatinine Ratio: 18 (ref 12–28)
BUN: 16 mg/dL (ref 8–27)
Bilirubin Total: 0.3 mg/dL (ref 0.0–1.2)
CO2: 26 mmol/L (ref 20–29)
Calcium: 9.8 mg/dL (ref 8.7–10.3)
Chloride: 102 mmol/L (ref 96–106)
Creatinine, Ser: 0.88 mg/dL (ref 0.57–1.00)
Globulin, Total: 2.4 g/dL (ref 1.5–4.5)
Glucose: 112 mg/dL — ABNORMAL HIGH (ref 65–99)
Potassium: 5.1 mmol/L (ref 3.5–5.2)
Sodium: 140 mmol/L (ref 134–144)
Total Protein: 6.9 g/dL (ref 6.0–8.5)
eGFR: 69 mL/min/{1.73_m2} (ref >59)

## 2020-08-25 LAB — TSH+FREE T4
Free T4: 0.88 ng/dL (ref 0.82–1.77)
TSH: 1.5 u[IU]/mL (ref 0.450–4.500)

## 2020-08-25 LAB — LIPID PANEL
Chol/HDL Ratio: 3.5 ratio (ref 0.0–4.4)
Cholesterol, Total: 214 mg/dL — ABNORMAL HIGH (ref 100–199)
HDL: 61 mg/dL (ref >39)
LDL Chol Calc (NIH): 115 mg/dL — ABNORMAL HIGH (ref 0–99)
Triglycerides: 218 mg/dL — ABNORMAL HIGH (ref 0–149)
VLDL Cholesterol Cal: 38 mg/dL (ref 5–40)

## 2020-08-25 LAB — VITAMIN D 25 HYDROXY (VIT D DEFICIENCY, FRACTURES): Vit D, 25-Hydroxy: 34 ng/mL (ref 30.0–100.0)

## 2020-08-26 ENCOUNTER — Ambulatory Visit (HOSPITAL_COMMUNITY)
Admission: RE | Admit: 2020-08-26 | Discharge: 2020-08-26 | Disposition: A | Discharge status: Home / Self Care | Payer: Medicare Other | Source: Ambulatory Visit | Attending: Cardiovascular Disease | Admitting: Cardiovascular Disease

## 2020-08-26 ENCOUNTER — Other Ambulatory Visit: Payer: Self-pay

## 2020-08-26 DIAGNOSIS — I2510 Atherosclerotic heart disease of native coronary artery without angina pectoris: Secondary | ICD-10-CM | POA: Insufficient documentation

## 2020-08-26 DIAGNOSIS — I209 Angina pectoris, unspecified: Secondary | ICD-10-CM | POA: Diagnosis not present

## 2020-08-26 MED ORDER — NITROGLYCERIN 0.4 MG SL SUBL
SUBLINGUAL_TABLET | SUBLINGUAL | Status: AC
  Filled 2020-08-26: qty 2

## 2020-08-26 MED ORDER — IOHEXOL 350 MG/ML SOLN
95.0000 mL | Freq: Once | INTRAVENOUS | Status: AC | PRN
  Administered 2020-08-26: 95 mL via INTRAVENOUS

## 2020-08-26 MED ORDER — NITROGLYCERIN 0.4 MG SL SUBL
0.8000 mg | SUBLINGUAL_TABLET | Freq: Once | SUBLINGUAL | Status: AC
  Administered 2020-08-26: 0.8 mg via SUBLINGUAL

## 2020-08-31 ENCOUNTER — Telehealth: Payer: Self-pay

## 2020-08-31 MED ORDER — PRAVASTATIN SODIUM 40 MG PO TABS: 40.0000 mg | ORAL_TABLET | Freq: Every evening | ORAL | 3 refills | Status: DC

## 2020-08-31 NOTE — Telephone Encounter (Signed)
Able to reach pt regarding her recent CTA, Dr. Rockey Situ had a chance to review her results and advised   "Cardiac CTA  Nonobstructive disease noted, calcium score 260  Findings above cannot explain any chest pain symptoms  No catheterization needed  We do need to get cholesterol down further  Options include increasing the pravastatin up to 20 or 40, or increasing pravastatin and add Zetia 10 mg daily  continue aspirin but does not need to be 325 can do 41"  Virginia Phillips reports would like to increase her pravastatin to 40 mg daily instead adding the extra pill Zetia, will repeat her lipids in 6 months at her next office visit with Dr. Rockey Situ. Drd review her current lipid results from last week, advised that they are elevated, LDL is not at goal per cardiology standpoint, but she will wait for her PCP to call with lab results from ordering provider as there are multiple lab orders.  Will also decrease her ASA to 81 mg daily, will use up her current 10 mg tabs of pravastatin as she just got refills, then next refills will have her script for the 40 mg tab. Medication list also updated.   Otherwise all questions or concerns were address and no additional concerns at this time. Agreeable to plan, will call back for anything further.

## 2020-09-02 ENCOUNTER — Ambulatory Visit: Payer: Medicare Other | Admitting: Cardiovascular Disease

## 2020-09-09 ENCOUNTER — Other Ambulatory Visit: Payer: Self-pay

## 2020-09-09 DIAGNOSIS — N959 Unspecified menopausal and perimenopausal disorder: Secondary | ICD-10-CM

## 2020-09-09 MED ORDER — VENLAFAXINE HCL ER 75 MG PO CP24: 75.0000 mg | ORAL_CAPSULE | Freq: Every day | ORAL | 1 refills | Status: DC

## 2020-09-13 ENCOUNTER — Telehealth: Payer: Self-pay

## 2020-09-13 NOTE — Telephone Encounter (Signed)
Patient needs to be seen. She stated her equilibrium is off and she is dizzy. She is requesting an appointment.

## 2020-09-13 NOTE — Telephone Encounter (Signed)
I can see her Thursday morning

## 2020-09-13 NOTE — Telephone Encounter (Signed)
Spoke with pt check bp and also take your time before getup and call us back with bp reading in the morning

## 2020-09-14 ENCOUNTER — Telehealth: Payer: Self-pay | Admitting: Cardiovascular Disease

## 2020-09-14 DIAGNOSIS — R931 Abnormal findings on diagnostic imaging of heart and coronary circulation: Secondary | ICD-10-CM

## 2020-09-14 DIAGNOSIS — R079 Chest pain, unspecified: Secondary | ICD-10-CM

## 2020-09-14 NOTE — Telephone Encounter (Signed)
Pt c/o medication issue:  1. Name of Medication: pravastatin  2. How are you currently taking this medication (dosage and times per day)? 20 mg po q d for 2 weeks   3. Are you having a reaction (difficulty breathing--STAT)? Off balance   4. What is your medication issue? Since increasing dose to 20 mg patient has felt like she was on a train   Patient reports bp elevated 163/86  HR 75

## 2020-09-14 NOTE — Telephone Encounter (Signed)
Spoke with pt she still dizzy and high BP and also pt said this happen after she start pravastatin given by dr Fletcher Anon advised her to call him if they see her then she can call and canceled appt

## 2020-09-14 NOTE — Telephone Encounter (Signed)
Reach back out to Virginia Phillips, reports ever since she has been on the pravastatin she has not done well with it. Makes her feel off balance and as if she is "on a train, makes me feel so funny". Pt reports has been taking 20 mg Pravastatin for two weeks. It was increased from 10 to 20 mg after her CTA results, she did tell the nurse she would try the 40 mg daily, and refused Zetia at that time.  Advised on reducing pravastatin back down to 10 mg daily and add Zetia as instructed a month ago but declined again. Pt reports starting tomorrow she is not taking pravastatin anymore as she "cannot take how this is making me feel". Advised could could try Zetia alone and wait for Virginia Phillips recommendations, but Virginia Phillips reports she is wanting to get pravastatin "out of my system and I will wait to hear back from Virginia Phillips for what I should do next and see if there is another medication I can try". Reports her dietary intake is well balance and geared to low cholesterol.   Will route message to Virginia Phillips to see his recommendations and get back in touch with pt with med changes.

## 2020-09-15 NOTE — Telephone Encounter (Signed)
Attempted to reach pt via phone, unable to may contact, LMTCB.

## 2020-09-16 ENCOUNTER — Other Ambulatory Visit: Payer: Self-pay

## 2020-09-16 ENCOUNTER — Encounter: Payer: Self-pay | Admitting: Internal Medicine

## 2020-09-16 ENCOUNTER — Ambulatory Visit (INDEPENDENT_AMBULATORY_CARE_PROVIDER_SITE_OTHER): Payer: Medicare Other | Admitting: Internal Medicine

## 2020-09-16 DIAGNOSIS — R300 Dysuria: Secondary | ICD-10-CM | POA: Diagnosis not present

## 2020-09-16 DIAGNOSIS — L509 Urticaria, unspecified: Secondary | ICD-10-CM | POA: Diagnosis not present

## 2020-09-16 DIAGNOSIS — R42 Dizziness and giddiness: Secondary | ICD-10-CM

## 2020-09-16 DIAGNOSIS — I951 Orthostatic hypotension: Secondary | ICD-10-CM | POA: Diagnosis not present

## 2020-09-16 LAB — POCT URINALYSIS DIPSTICK
Bilirubin, UA: NEGATIVE
Blood, UA: NEGATIVE
Glucose, UA: NEGATIVE
Ketones, UA: NEGATIVE
Leukocytes, UA: NEGATIVE
Nitrite, UA: NEGATIVE
Protein, UA: NEGATIVE
Spec Grav, UA: 1.015 (ref 1.010–1.025)
Urobilinogen, UA: 0.2 E.U./dL
pH, UA: 6 (ref 5.0–8.0)

## 2020-09-16 MED ORDER — MECLIZINE HCL 12.5 MG PO TABS: ORAL_TABLET | ORAL | 1 refills | Status: DC

## 2020-09-16 MED ORDER — TETANUS-DIPHTH-ACELL PERTUSSIS 5-2.5-18.5 LF-MCG/0.5 IM SUSY: 0.5000 mL | PREFILLED_SYRINGE | Freq: Once | INTRAMUSCULAR | 0 refills | Status: AC

## 2020-09-16 NOTE — Progress Notes (Signed)
[facility] ([place], [place] [address]  Internal MEDICINE  Office Visit Note  Patient Name: Virginia Phillips  [id number]  [id number]  Date of Service: 09/23/2020  Chief Complaint  Patient presents with   Acute Visit    Dizziness, started a few weeks ago, patient has increased Pravastin from 20m to 278m    HPI Patient is seen for acute and sick visit. Her pravastatin was increased to 20 mg and since then patient thinks that she has been having dizziness patient has problem walking and holding on  to support wall Patient also sees cardiology and takes Norvasc 5 mg and Diovan 80 mg once a day patient is orthostatic here in the office. She denies any symptoms of urinary frequency or dysuria Recent CT coronary angio was normal Patient is also experiencing hives after eating certain type of foods and is interested in getting allergy testing Current Medication: Outpatient Encounter Medications as of 09/16/2020  Medication Sig   amLODipine (NORVASC) 5 MG tablet Take 1 tablet (5 mg total) by mouth daily.   aspirin 325 MG tablet Take 81 mg by mouth daily.   aspirin-acetaminophen-caffeine (EXCEDRIN MIGRAINE) 250-250-65 MG tablet Take 2 tablets by mouth daily as needed for headache or migraine.    aspirin-sod bicarb-citric acid (ALKA-SELTZER) 325 MG TBEF tablet Take 650 mg by mouth every 6 (six) hours as needed (indigestion).   Biotin 5 MG CAPS Take 5,000 mg by mouth daily at 2 PM.   BLACK CURRANT SEED OIL PO Take 1 capsule by mouth daily at 2 PM.   Cholecalciferol (VITAMIN D) 2000 UNITS CAPS Take 2,000 Units by mouth daily at 2 PM.    Coenzyme Q10 (CO Q10) 200 MG CAPS Take 200 mg by mouth daily at 2 PM.    ibuprofen (ADVIL,MOTRIN) 200 MG tablet Take 400 mg by mouth daily as needed for headache or moderate pain.    Lancets (ONETOUCH ULTRASOFT) lancets Check sugar once daily DX 73.9 needs for one touch ultra 2 lancets   meclizine (ANTIVERT) 12.5 MG tablet One tab po tid  prn for dizziness   Misc Natural Products (PETADOLEX 50) 50 MG CAPS Take 100 mg by mouth 2 (two) times daily at 10 AM and 5 PM.   multivitamin-lutein (OCUVITE-LUTEIN) CAPS capsule Take 1 capsule by mouth daily.   ONE TOUCH ULTRA TEST test strip CHECK BLOOD SUGAR ONCE DAILY AS DIRECTED   polyethylene glycol (MIRALAX / GLYCOLAX) packet Take 17 g by mouth daily.    Polyvinyl Alcohol-Povidone (REFRESH OP) Place 1 drop into both eyes daily as needed (dry eyes).   pravastatin (PRAVACHOL) 10 MG tablet Take by mouth.   pyridOXINE (VITAMIN B-6) 100 MG tablet Take 100 mg by mouth daily at 2 PM.    Riboflavin (B2) 100 MG TABS Take 100 mg by mouth 2 (two) times daily.   SUPER B COMPLEX/C PO Take 1 tablet by mouth daily at 2 PM.   Thiamine HCl (VITAMIN B-1) 250 MG tablet Take 250 mg by mouth daily at 2 PM.   valsartan (DIOVAN) 80 MG tablet Take 80 mg by mouth daily at 2 PM.    venlafaxine XR (EFFEXOR-XR) 75 MG 24 hr capsule Take 1 capsule (75 mg total) by mouth daily at 2 PM.   vitamin B-12 (CYANOCOBALAMIN) 1000 MCG tablet Take 1,000 mcg by mouth daily at 2 PM.   vitamin C (ASCORBIC ACID) 500 MG tablet Take 500 mg by mouth daily at 2 PM.   [  DISCONTINUED] Tdap (BOOSTRIX) 5-2.5-18.5 LF-MCG/0.5 injection Inject 0.5 mLs into the muscle once.   No facility-administered encounter medications on file as of 09/16/2020.    Surgical History: Past Surgical History:  Procedure Laterality Date   ANKLE FRACTURE SURGERY Right 2007   COLONOSCOPY WITH PROPOFOL N/A 05/10/2017   Procedure: COLONOSCOPY WITH PROPOFOL;  Surgeon: [name], MD;  Location: [facility] ENDOSCOPY;  Service: Endoscopy;  Laterality: N/A;   ESOPHAGOGASTRODUODENOSCOPY (EGD) WITH PROPOFOL N/A 10/17/2017   Procedure: ESOPHAGOGASTRODUODENOSCOPY (EGD) WITH PROPOFOL;  Surgeon: [name], MD;  Location: [facility] ENDOSCOPY;  Service: Endoscopy;  Laterality: N/A;   GANGLION CYST EXCISION Right    wrist   HERNIA REPAIR     2011   KNEE  ARTHROSCOPY W/ ACL RECONSTRUCTION Right 2008   KNEE SURGERY  08/05/2007   LASIK     NISSEN FUNDOPLICATION  0321    Medical History: Past Medical History:  Diagnosis Date   Coronary artery disease    GERD (gastroesophageal reflux disease)    Glaucoma    Headache    migraines   History of shingles    Hyperlipidemia    Hypertension    Pre-diabetes    Pulmonary embolism on right [facility]) 2007   right lung   Sjogren's syndrome ([place])    Stroke ([place]) 1998    Family History: Family History  Problem Relation Age of Onset   Congestive Heart Failure Mother    Parkinson's disease Mother    Diabetes Father    Scleroderma Sister    Cancer Brother     Social History   Socioeconomic History   Marital status: Married    Spouse name: Not on file   Number of children: 1   Years of education: Not on file   Highest education level: Some college, no degree  Occupational History   Occupation: retired  Tobacco Use   Smoking status: Never   Smokeless tobacco: Never  Vaping Use   Vaping Use: Never used  Substance and Sexual Activity   Alcohol use: No    Alcohol/week: 0.0 standard drinks   Drug use: No   Sexual activity: Not on file  Other Topics Concern   Not on file  Social History Narrative   Not on file   Social Determinants of Health   Financial Resource Strain: Not on file  Food Insecurity: Not on file  Transportation Needs: Not on file  Physical Activity: Not on file  Stress: Not on file  Social Connections: Not on file  Intimate Partner Violence: Not on file      Review of Systems  Constitutional:  Negative for fatigue and fever.  HENT:  Negative for congestion, mouth sores and postnasal drip.   Respiratory:  Negative for cough.   Cardiovascular:  Negative for chest pain.  Genitourinary:  Negative for flank pain.  Neurological:  Positive for dizziness and weakness.  Psychiatric/Behavioral: Negative.     Vital Signs: BP 118/84   Temp 98.5 F (36.9 C)   Resp  16   Ht 5' 6"  (1.676 m)   Wt 186 lb 3.2 oz (84.5 kg)   SpO2 97%   BMI 30.05 kg/m    Physical Exam Constitutional:      Appearance: Normal appearance.  HENT:     Head: Normocephalic and atraumatic.     Nose: Nose normal.     Mouth/Throat:     Mouth: Mucous membranes are moist.     Pharynx: No posterior oropharyngeal erythema.  Eyes:  Extraocular Movements: Extraocular movements intact.     Pupils: Pupils are equal, round, and reactive to light.  Cardiovascular:     Pulses: Normal pulses.     Heart sounds: Normal heart sounds.  Pulmonary:     Effort: Pulmonary effort is normal.     Breath sounds: Normal breath sounds.  Neurological:     General: No focal deficit present.     Mental Status: She is alert.  Psychiatric:        Mood and Affect: Mood normal.        Behavior: Behavior normal.       Assessment/Plan: 1. Dizziness and giddiness Seems to be multifactorial patient is followed by cardiology however I do not see an echocardiogram report. I also have looked at her carotid u/s and might need further testing for continuation and monitoring of her carotid atherosclerosis - meclizine (ANTIVERT) 12.5 MG tablet; One tab po tid prn for dizziness  Dispense: 30 tablet; Refill: 1  2. Hives of unknown origin Patient is to continue over-the-counter Claritin and will order a food allergy profile - Food Allergy Profile  3. Orthostatic hypotension Hypotensive while standing we will instruct to hold her Diovan for now until seen next visit  4. Dysuria Will send urine for cultures and sensitivities - POCT urinalysis dipstick   General Counseling: Valina verbalizes understanding of the findings of todays visit and agrees with plan of treatment. I have discussed any further diagnostic evaluation that may be needed or ordered today. We also reviewed her medications today. she has been encouraged to call the office with any questions or concerns that should arise related to  todays visit.    Orders Placed This Encounter  Procedures   Food Allergy Profile   POCT urinalysis dipstick    Meds ordered this encounter  Medications   meclizine (ANTIVERT) 12.5 MG tablet    Sig: One tab po tid prn for dizziness    Dispense:  30 tablet    Refill:  1    Total time spent:35 Minutes Time spent includes review of chart, medications, test results, and follow up plan with the patient.   [place] Controlled Substance Database was reviewed by me.   Dr Lavera Guise Internal medicine

## 2020-09-19 LAB — FOOD ALLERGY PROFILE
Allergen Corn, IgE: <0.1 kU/L
Clam IgE: <0.1 kU/L
Codfish IgE: <0.1 kU/L
Egg White IgE: <0.1 kU/L
Milk IgE: <0.1 kU/L
Peanut IgE: <0.1 kU/L
Scallop IgE: <0.1 kU/L
Sesame Seed IgE: <0.1 kU/L
Shrimp IgE: <0.1 kU/L
Soybean IgE: <0.1 kU/L
Walnut IgE: <0.1 kU/L
Wheat IgE: <0.1 kU/L

## 2020-09-21 ENCOUNTER — Telehealth: Payer: Self-pay

## 2020-09-21 NOTE — Telephone Encounter (Signed)
Patient returning call.

## 2020-09-21 NOTE — Telephone Encounter (Signed)
Pt advised food allergy test is negative and mailed copy result to pt

## 2020-09-22 NOTE — Telephone Encounter (Signed)
Returned the patients call. Unable to lmom. Pt phone rings out.

## 2020-09-23 ENCOUNTER — Ambulatory Visit (INDEPENDENT_AMBULATORY_CARE_PROVIDER_SITE_OTHER): Payer: Medicare Other | Admitting: Dermatology

## 2020-09-23 ENCOUNTER — Other Ambulatory Visit: Payer: Self-pay

## 2020-09-23 DIAGNOSIS — L820 Inflamed seborrheic keratosis: Secondary | ICD-10-CM | POA: Diagnosis not present

## 2020-09-23 NOTE — Progress Notes (Signed)
   Follow-Up Visit   Subjective  Virginia Phillips is a 75 y.o. female who presents for the following: Follow-up (Patient here today for 2 month ISK follow up at left cheek. Patient advises there is some residual. She would like it treated again. ).  Patient accompanied by husband.   The following portions of the chart were reviewed this encounter and updated as appropriate:   Tobacco  Allergies  Meds  Problems  Med Hx  Surg Hx  Fam Hx      Review of Systems:  No other skin or systemic complaints except as noted in HPI or Assessment and Plan.  Objective  Well appearing patient in no apparent distress; mood and affect are within normal limits.  A focused examination was performed including face. Relevant physical exam findings are noted in the Assessment and Plan.  left cheek Erythematous keratotic or waxy stuck-on papule or plaque.    Assessment & Plan  Inflamed seborrheic keratosis left cheek  Residual   Prior to procedure, discussed risks of blister formation, small wound, skin dyspigmentation, or rare scar following cryotherapy. Recommend Vaseline ointment to treated areas while healing.   Destruction of lesion - left cheek  Destruction method: cryotherapy   Informed consent: discussed and consent obtained   Lesion destroyed using liquid nitrogen: Yes   Cryotherapy cycles:  2 Outcome: patient tolerated procedure well with no complications   Post-procedure details: wound care instructions given    Return if symptoms worsen or fail to improve.  Graciella Belton, RMA, am acting as scribe for Forest Gleason, MD .   Documentation: I have reviewed the above documentation for accuracy and completeness, and I agree with the above.  Forest Gleason, MD

## 2020-09-23 NOTE — Patient Instructions (Signed)
Cryotherapy Aftercare  Wash gently with soap and water everyday.   Apply Vaseline and Band-Aid daily until healed.   Prior to procedure, discussed risks of blister formation, small wound, skin dyspigmentation, or rare scar following cryotherapy. Recommend Vaseline ointment to treated areas while healing.  Recommend daily broad spectrum sunscreen SPF 30+ to sun-exposed areas, reapply every 2 hours as needed. Call for new or changing lesions.  Staying in the shade or wearing long sleeves, sun glasses (UVA+UVB protection) and wide brim hats (4-inch brim around the entire circumference of the hat) are also recommended for sun protection.   If you have any questions or concerns for your doctor, please call our main line at [phone] and press option 4 to reach your doctor's medical assistant. If no one answers, please leave a voicemail as directed and we will return your call as soon as possible. Messages left after 4 pm will be answered the following business day.   You may also send [place] a message via [place]. We typically respond to MyChart messages within 1-2 business days.  For prescription refills, please ask your pharmacy to contact our office. Our fax number is [phone].  If you have an urgent issue when the clinic is closed that cannot wait until the next business day, you can page your doctor at the number below.    Please note that while we do our best to be available for urgent issues outside of office hours, we are not available 24/7.   If you have an urgent issue and are unable to reach [place], you may choose to seek medical care at your doctor's office, retail clinic, urgent care center, or emergency room.  If you have a medical emergency, please immediately call 911 or go to the emergency department.  Pager Numbers  - Dr. Nehemiah Massed: [phone]  - Dr. Laurence Ferrari: [phone]  - Dr. Nicole Kindred: [phone]  In the event of inclement weather, please call our main line at  [phone] for an update on the status of any delays or closures.  Dermatology Medication Tips: Please keep the boxes that topical medications come in in order to help keep track of the instructions about where and how to use these. Pharmacies typically print the medication instructions only on the boxes and not directly on the medication tubes.   If your medication is too expensive, please contact our office at [phone] option 4 or send [place] a message through [place].   We are unable to tell what your co-pay for medications will be in advance as this is different depending on your insurance coverage. However, we may be able to find a substitute medication at lower cost or fill out paperwork to get insurance to cover a needed medication.   If a prior authorization is required to get your medication covered by your insurance company, please allow [place] 1-2 business days to complete this process.  Drug prices often vary depending on where the prescription is filled and some pharmacies may offer cheaper prices.  The website www.goodrx.com contains coupons for medications through different pharmacies. The prices here do not account for what the cost may be with help from insurance (it may be cheaper with your insurance), but the website can give you the price if you did not use any insurance.  - You can print the associated coupon and take it with your prescription to the pharmacy.  - You may also stop by our office during regular business hours and pick up a GoodRx coupon card.  -  If you need your prescription sent electronically to a different pharmacy, notify our office through [facility] or by phone at [phone] option 4.

## 2020-09-24 ENCOUNTER — Ambulatory Visit: Payer: Medicare Other | Admitting: Hospice and Palliative Medicine

## 2020-09-24 MED ORDER — ROSUVASTATIN CALCIUM 5 MG PO TABS: 5.0000 mg | ORAL_TABLET | Freq: Every day | ORAL | 3 refills | Status: DC

## 2020-09-24 NOTE — Telephone Encounter (Signed)
Per Dr. Rockey Situ - "2 items. She can stop the pravastatin and if she would like to start different cholesterol medication such as Crestor 73m daily when she is ready. Other option would be Atorvastatin 159m Second item is defect noted on cardiac CTA. There is a 4.9 x 3.5 x 2.2 mm left to right interatrial communication. Cannot exclude PFO. We need echocardiogram to further evaluate. Cannot be sure this is not atrial septal defect versus PFO. Echo should be with bubbles/bubble study. Thx TG."  Spoke to pt. Notified of Dr. GoDonivan Scullecc above. Pt agrees to try Crestor 86m83maily. [facility] if has bothersome side effects to stop and contact our office to let us [place].  Pt also agrees to proceed with Echo Bubble study. Pt aware someone from scheduling will contact to schedule.  Pt has no further questions at this time.

## 2020-09-24 NOTE — Addendum Note (Signed)
Addended by: Darlyne Russian on: 09/24/2020 03:17 PM   Modules accepted: Orders

## 2020-10-04 ENCOUNTER — Encounter: Payer: Self-pay | Admitting: Dermatology

## 2020-10-04 ENCOUNTER — Telehealth: Payer: Self-pay | Admitting: Cardiovascular Disease

## 2020-10-04 NOTE — Telephone Encounter (Signed)
Patient calling  Wants to discuss why she needs ECHO bubble exam Would like to know if she can have her foot surgery before getting this completed Please call to discuss

## 2020-10-05 ENCOUNTER — Other Ambulatory Visit: Payer: Self-pay

## 2020-10-05 ENCOUNTER — Ambulatory Visit (INDEPENDENT_AMBULATORY_CARE_PROVIDER_SITE_OTHER): Payer: Medicare Other

## 2020-10-05 DIAGNOSIS — R079 Chest pain, unspecified: Secondary | ICD-10-CM | POA: Diagnosis not present

## 2020-10-05 MED ORDER — PERFLUTREN LIPID MICROSPHERE
1.0000 mL | INTRAVENOUS | Status: AC | PRN
  Administered 2020-10-05: 2 mL via INTRAVENOUS

## 2020-10-05 NOTE — Telephone Encounter (Signed)
Was able to return call to Mrs. Schriever to advise on need for bubble study with echo, educated pt on what a bubble study is and how it can identify potential blood flow issues inside your heart.   Virginia Phillips verbalized understanding and is currently on her way in for her echo as it's schedule at 1 pm today.

## 2020-10-06 ENCOUNTER — Telehealth: Payer: Self-pay

## 2020-10-06 LAB — ECHOCARDIOGRAM COMPLETE BUBBLE STUDY
AR max vel: 1.87 cm2
AV Area VTI: 1.82 cm2
AV Area mean vel: 1.75 cm2
AV Mean grad: 3 mmHg
AV Peak grad: 4.5 mmHg
Ao pk vel: 1.06 m/s
Area-P 1/2: 3.08 cm2
MV VTI: 1.38 cm2
S' Lateral: 2.2 cm

## 2020-10-06 NOTE — Telephone Encounter (Signed)
Able to reach pt regarding her recent ECHO Dr. Rockey Situ had a chance to review her results and advised   "Echo  Normal cardiac function,  No sign of significant shunting on bubble study  No dilation of cardiac chambers  Overall good news "  Virginia Phillips thankful for the phone call of her echo results, delighted of the results. Pt reports still having issues with her cholesterol medications, would like her medication change.  Pt asked if there was any openings to be seen as she has been placed on a wait list, and is not schedule until late September. Was able to reschedule to an opening this Friday with Virginia Faith, PA-C at 11 am. Pt very grateful of this sooner appt. Will see pt on Friday.

## 2020-10-07 NOTE — Progress Notes (Signed)
Cardiology Office Note    Date:  10/08/2020   ID:  Virginia, Phillips [birth year], MRN [id number]  PCP:  Lavera Guise, MD  Cardiologist:  Ida Rogue, MD  Electrophysiologist:  None   Chief Complaint: Discuss medications  History of Present Illness:   Virginia Phillips is a 75 y.o. 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  She was previously followed by Dr. Nehemiah Massed, though transitioned to Dr. Rockey Situ in 07/2020.  Prior echo at [facility] in 03/2018 demonstrated an EF greater than 55%, normal wall motion, mild LVH, normal RV systolic function, and trivial mitral/tricuspid regurgitation.  She has previously undergone carotid artery ultrasound in 12/2019, though results are unavailable for review.  She established care with Dr. Rockey Situ on 08/12/2020 for a second opinion regarding episodes of chest discomfort that would wake her from sleep and took several hours to improve with stuttering chest pain afterwards.  She did not feel like symptoms were related to her prior hiatal hernia.  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  Given symptoms, she underwent coronary CTA on 08/26/2020 showed a calcium score of 231 which was the 76 percentile.  There was minimal nonobstructive CAD.  Incidentally, there was left to right intra-arterial communication noted.  Given calcium score, pravastatin was titrated to 20 mg.    She notified our office in 08/2020 she felt like she was off balance following titration of pravastatin from 10 mg to 20 mg.  She was subsequently transition to rosuvastatin 5 mg.  Echo with bubble study on 10/05/2020 demonstrated an EF of 60  to 65%, mild LVH, grade 1 diastolic dysfunction, normal RV systolic function and ventricular cavity size, no significant valvular abnormalities, and estimated right atrial pressure of 3 mmHg, and agitated saline contrast bubble study was negative without evidence of any intra-arterial shunting.  Upon calling echo results, she indicated she was still having problems with her statin and requested appointment for today.  She comes in accompanied by her husband today and is doing well from a cardiac perspective.  She did not tolerate the transition from pravastatin to rosuvastatin secondary to a sensation of imbalance and foggy headedness.  They do note that she previously tolerated pravastatin 10 mg daily, though not 20 mg daily as outlined above.  Her last dose of rosuvastatin was approximately 1 week ago.  She is interested in rechallenge in simvastatin which was previously noted to cause myalgias.  She is interested in this given that her husband takes this and tolerates it well.  She would also be interested in addition of ezetimibe in a stepwise fashion.  No angina, dyspnea, palpitations, or lower extremity swelling.  She also notes that she will likely be having surgery on her toes which was previously scheduled for 04/2018, though canceled in the setting of the [place] pandemic.  Her functional status is quite limited secondary to deconditioning and foot pain.  However, she is able to achieve greater than 4 METs without cardiac limitation and does not have any symptoms concerning for angina or decompensation.   Labs independently reviewed: 07/2020 - TSH normal, TC 214, TG 218, HDL 61, LDL 115, BUN 16, serum creatinine 0.88, potassium 5.1, albumin 4.5,  AST/ALT normal, Hgb 15.4, PLT 302 12/2019 - A1c 6.0  Past Medical History:  Diagnosis Date   Coronary artery disease    GERD (gastroesophageal reflux disease)    Glaucoma    Headache    migraines   History of shingles    Hyperlipidemia     Hypertension    Pre-diabetes    Pulmonary embolism on right [facility]) 2007   right lung   Sjogren's syndrome ([address])    Stroke ([place]) 1998    Past Surgical History:  Procedure Laterality Date   ANKLE FRACTURE SURGERY Right 2007   COLONOSCOPY WITH PROPOFOL N/A 05/10/2017   Procedure: COLONOSCOPY WITH PROPOFOL;  Surgeon: [name], MD;  Location: [facility] ENDOSCOPY;  Service: Endoscopy;  Laterality: N/A;   ESOPHAGOGASTRODUODENOSCOPY (EGD) WITH PROPOFOL N/A 10/17/2017   Procedure: ESOPHAGOGASTRODUODENOSCOPY (EGD) WITH PROPOFOL;  Surgeon: [name], MD;  Location: [facility] ENDOSCOPY;  Service: Endoscopy;  Laterality: N/A;   GANGLION CYST EXCISION Right    wrist   HERNIA REPAIR     2011   KNEE ARTHROSCOPY W/ ACL RECONSTRUCTION Right 2008   KNEE SURGERY  08/05/2007   LASIK     NISSEN FUNDOPLICATION  1749    Current Medications: Current Meds  Medication Sig   amLODipine (NORVASC) 5 MG tablet Take 1 tablet (5 mg total) by mouth daily.   aspirin 325 MG tablet Take 81 mg by mouth daily.   aspirin-acetaminophen-caffeine (EXCEDRIN MIGRAINE) 250-250-65 MG tablet Take 2 tablets by mouth daily as needed for headache or migraine.    aspirin-sod bicarb-citric acid (ALKA-SELTZER) 325 MG TBEF tablet Take 650 mg by mouth every 6 (six) hours as needed (indigestion).   Biotin 5 MG CAPS Take 5,000 mg by mouth daily at 2 PM.   BLACK CURRANT SEED OIL PO Take 1 capsule by mouth daily at 2 PM.   Cholecalciferol (VITAMIN D) 2000 UNITS CAPS Take 2,000 Units by mouth daily at 2 PM.    Coenzyme Q10 (CO Q10) 200 MG CAPS Take 200 mg by mouth daily at 2 PM.    ibuprofen (ADVIL,MOTRIN) 200 MG tablet Take 400 mg by mouth daily as needed for headache or moderate pain.    Lancets (ONETOUCH ULTRASOFT) lancets Check sugar once daily DX 73.9 needs for one touch ultra 2 lancets   meclizine (ANTIVERT) 12.5 MG tablet One tab po tid prn for dizziness   Misc Natural Products (PETADOLEX 50) 50 MG CAPS Take 100 mg by  mouth daily.   multivitamin-lutein (OCUVITE-LUTEIN) CAPS capsule Take 1 capsule by mouth daily.   ONE TOUCH ULTRA TEST test strip CHECK BLOOD SUGAR ONCE DAILY AS DIRECTED   polyethylene glycol (MIRALAX / GLYCOLAX) packet Take 17 g by mouth daily.    Polyvinyl Alcohol-Povidone (REFRESH OP) Place 1 drop into both eyes daily as needed (dry eyes).   pyridOXINE (VITAMIN B-6) 100 MG tablet Take 100 mg by mouth daily at 2 PM.    Riboflavin (B2) 100 MG TABS Take 100 mg by mouth 2 (two) times daily.   simvastatin (ZOCOR) 5 MG tablet Take 1 tablet (5 mg total) by mouth daily.   SUPER B COMPLEX/C PO Take 1 tablet by mouth daily at 2 PM.   Thiamine HCl (VITAMIN B-1) 250 MG tablet Take 250 mg by mouth daily at 2 PM.   valsartan (DIOVAN) 80 MG tablet Take 80 mg by mouth daily at 2 PM.    venlafaxine XR (EFFEXOR-XR) 75 MG 24 hr capsule Take 1 capsule (75 mg total)  by mouth daily at 2 PM.   vitamin B-12 (CYANOCOBALAMIN) 1000 MCG tablet Take 1,000 mcg by mouth daily at 2 PM.   vitamin C (ASCORBIC ACID) 500 MG tablet Take 500 mg by mouth daily at 2 PM.   [DISCONTINUED] rosuvastatin (CRESTOR) 5 MG tablet Take 1 tablet (5 mg total) by mouth daily.   [DISCONTINUED] simvastatin (ZOCOR) 20 MG tablet Take 1 tablet (20 mg total) by mouth at bedtime.    Allergies:   Linzess [linaclotide], Amoxicillin, Blue dyes (parenteral), Butalbital-aspirin-caffeine, Clindamycin/lincomycin, Clobetasol, Dexlansoprazole, Diphenhydramine, Doxycycline, Esomeprazole, Gabapentin, Green dye, Iodinated diagnostic agents, Meloxicam, Metronidazole, Nortriptyline hcl, Prednisolone acetate, Tetracycline, Baby oil, Cortisone, Diphenhydramine hcl, Levofloxacin, Mineral oil, Prednisone, and Sulfa antibiotics   Social History   Socioeconomic History   Marital status: Married    Spouse name: Not on file   Number of children: 1   Years of education: Not on file   Highest education level: Some college, no degree  Occupational History    Occupation: retired  Tobacco Use   Smoking status: Never   Smokeless tobacco: Never  Vaping Use   Vaping Use: Never used  Substance and Sexual Activity   Alcohol use: No    Alcohol/week: 0.0 standard drinks   Drug use: No   Sexual activity: Not on file  Other Topics Concern   Not on file  Social History Narrative   Not on file   Social Determinants of Health   Financial Resource Strain: Not on file  Food Insecurity: Not on file  Transportation Needs: Not on file  Physical Activity: Not on file  Stress: Not on file  Social Connections: Not on file     Family History:  The patient's family history includes Cancer in her brother; Congestive Heart Failure in her mother; Diabetes in her father; Parkinson's disease in her mother; Scleroderma in her sister.  ROS:   Review of Systems  Constitutional:  Positive for malaise/fatigue. Negative for chills, diaphoresis, fever and weight loss.  HENT:  Negative for congestion.   Eyes:  Negative for discharge and redness.  Respiratory:  Negative for cough, sputum production, shortness of breath and wheezing.   Cardiovascular:  Negative for chest pain, palpitations, orthopnea, claudication, leg swelling and PND.  Gastrointestinal:  Negative for abdominal pain, heartburn, nausea and vomiting.  Musculoskeletal:  Negative for falls and myalgias.  Skin:  Negative for rash.  Neurological:  Positive for weakness. Negative for dizziness, tingling, tremors, sensory change, speech change, focal weakness and loss of consciousness.       Improved malaise and fatigue following discontinuation of rosuvastatin  Endo/Heme/Allergies:  Does not bruise/bleed easily.  Psychiatric/Behavioral:  Negative for substance abuse. The patient is not nervous/anxious.   All other systems reviewed and are negative.   EKGs/Labs/Other Studies Reviewed:    Studies reviewed were summarized above. The additional studies were reviewed today:  2D echo 10/05/2020: 1. Left  ventricular ejection fraction, by estimation, is 60 to 65%. The  left ventricle has normal function. Left ventricular endocardial border  not optimally defined to evaluate regional wall motion. There is mild left  ventricular hypertrophy. Left  ventricular diastolic parameters are consistent with Grade I diastolic  dysfunction (impaired relaxation).   2. Right ventricular systolic function is normal. The right ventricular  size is normal.   3. The mitral valve is grossly normal. No evidence of mitral valve  regurgitation. No evidence of mitral stenosis.   4. The aortic valve is normal in structure. Aortic valve regurgitation is  not visualized. No aortic stenosis is present.   5. The inferior vena cava is normal in size with greater than 50%  respiratory variability, suggesting right atrial pressure of 3 mmHg.   6. Agitated saline contrast bubble study was negative, with no evidence  of any interatrial shunt. __________  Coronary CTA 08/26/2020: Aorta:  Normal size.  No calcifications.  No dissection.   Main Pulmonary Artery: Normal size of the pulmonary artery.   Aortic Valve:  Tri-leaflet.  No calcifications.   Coronary Arteries:  Normal coronary origin.  Right dominance.   Coronary Calcium Score:   Left main: 0   Left anterior descending artery: 68   Left circumflex artery: 65   Right coronary artery: 98   Total: 231   Percentile: 76th for age, sex, and race matched control.   RCA is a large dominant artery that gives rise to PDA and PLA. There is a minimal, non-obstructive(1-24%) calcified plaque in the distal RCA.   Left main is a large artery that gives rise to LAD and LCX arteries. There is no plaque.   LAD is a large vessel that gives rise to two diagonal vessels. There is a minimal, non-obstructive(1-24%) calcified plaque in the proximal LAD. There is a minimal, non-obstructive(1-24%) calcified plaque in the distal D1 vessel.   LCX is a non-dominant artery  that gives rise to two OM branches. There is a minimal, non-obstructive(1-24%) calcified plaque in the proximal LCX.   Other findings:   Normal pulmonary vein drainage into the left atrium.   Normal left atrial appendage without a thrombus.   There is a 4.9 X 3.5 X 2.2 mm left to right interatrial communication. Cannot exclude PFO.   Extra-cardiac findings: See attached radiology report for non-cardiac structures.   IMPRESSION: 1. Coronary calcium score of 231. This was 76th percentile for age, sex, and race matched control.   2. Normal coronary origin with right dominance.   3. CAD-RADS 1. Minimal non-obstructive CAD (1-24%). Consider non-atherosclerotic causes of chest pain. Consider preventive therapy and risk factor modification.   4. There is a 4.9 X 3.5 X 2.2 mm left to right interatrial communication. Cannot exclude PFO.   RECOMMENDATIONS:   Coronary artery calcium (CAC) score is a strong predictor of incident coronary heart disease (CHD) and provides predictive information beyond traditional risk factors. CAC scoring is reasonable to use in the decision to withhold, postpone, or initiate statin therapy in intermediate-risk or selected borderline-risk asymptomatic adults (age 41-75 years and LDL-C >=70 to <190 mg/dL) who do not have diabetes or established atherosclerotic cardiovascular disease (ASCVD).* In intermediate-risk (10-year ASCVD risk >=7.5% to <20%) adults or selected borderline-risk (10-year ASCVD risk >=5% to <7.5%) adults in whom a CAC score is measured for the purpose of making a treatment decision the following recommendations have been made:   If CAC = 0, it is reasonable to withhold statin therapy and reassess in 5 to 10 years, as long as higher risk conditions are absent (diabetes mellitus, family history of premature CHD in first degree relatives (males <55 years; females <65 years), cigarette smoking, LDL >=190 mg/dL or other independent  risk factors).   If CAC is 1 to 99, it is reasonable to initiate statin therapy for patients ?75 years of age.   If CAC is >=100 or >=75th percentile, it is reasonable to initiate statin therapy at any age.   Cardiology referral should be considered for patients with CAC scores =400 or >=75th percentile.  EKG:  EKG is not ordered today.    Recent Labs: 08/23/2020: ALT 23; BUN 16; Creatinine, Ser 0.88; Hemoglobin 15.4; Platelets 302; Potassium 5.1; Sodium 140; TSH 1.500  Recent Lipid Panel    Component Value Date/Time   CHOL 214 (H) 08/23/2020 1432   CHOL 166 03/06/2012 1512   TRIG 218 (H) 08/23/2020 1432   TRIG 93 03/06/2012 1512   HDL 61 08/23/2020 1432   HDL 76 (H) 03/06/2012 1512   CHOLHDL 3.5 08/23/2020 1432   CHOLHDL 2.9 12/14/2016 1339   VLDL 19 03/06/2012 1512   LDLCALC 115 (H) 08/23/2020 1432   LDLCALC 96 12/14/2016 1339   LDLCALC 71 03/06/2012 1512    PHYSICAL EXAM:    VS:  BP 120/80 (BP Location: Left Arm, Patient Position: Sitting, Cuff Size: Large)   Pulse 72   Ht 5' 6"  (1.676 m)   Wt 187 lb (84.8 kg)   SpO2 95%   BMI 30.18 kg/m   BMI: Body mass index is 30.18 kg/m.  Physical Exam Vitals reviewed.  Constitutional:      Appearance: She is well-developed.  HENT:     Head: Normocephalic and atraumatic.  Eyes:     General:        Right eye: No discharge.        Left eye: No discharge.  Neck:     Vascular: No JVD.  Cardiovascular:     Rate and Rhythm: Normal rate and regular rhythm.     Pulses:          Dorsalis pedis pulses are 2+ on the right side and 2+ on the left side.       Posterior tibial pulses are 2+ on the right side and 2+ on the left side.     Heart sounds: Normal heart sounds, S1 normal and S2 normal. Heart sounds not distant. No midsystolic click and no opening snap. No murmur heard.   No friction rub.  Pulmonary:     Effort: Pulmonary effort is normal. No respiratory distress.     Breath sounds: Normal breath sounds. No  decreased breath sounds, wheezing or rales.  Chest:     Chest wall: No tenderness.  Abdominal:     General: There is no distension.     Palpations: Abdomen is soft.     Tenderness: There is no abdominal tenderness.  Musculoskeletal:     Cervical back: Normal range of motion.     Right lower leg: No edema.     Left lower leg: No edema.  Skin:    General: Skin is warm and dry.     Nails: There is no clubbing.  Neurological:     Mental Status: She is alert and oriented to person, place, and time.  Psychiatric:        Speech: Speech normal.        Behavior: Behavior normal.        Thought Content: Thought content normal.        Judgment: Judgment normal.    Wt Readings from Last 3 Encounters:  10/08/20 187 lb (84.8 kg)  09/16/20 186 lb 3.2 oz (84.5 kg)  08/12/20 186 lb 4 oz (84.5 kg)     ASSESSMENT & PLAN:   Nonobstructive CAD: No symptoms concerning for angina.  Recommend aggressive risk factor modification and primary prevention including continuation of aspirin and rechallenge of simvastatin as outlined below.  No indication for further ischemic testing at this time.  HLD: LDL 115 in 07/2020  with normal LFT at that time.  Goal LDL less than 70.  Calcium score to 31 which places her in the 76 percentile.  She has been noted to have multiple medication intolerances, including multiple statins.  Historically, she was tolerating pravastatin 10 mg, though did not tolerate 20 mg secondary to sensation of imbalance and mental fogginess.  She has not tolerated the transition to low-dose Crestor for similar reasons.  She is willing to undergo a trial of simvastatin 5 mg nightly.  Would also consider trial of ezetimibe after we see how she tolerates simvastatin.  Ideally, she would be a good candidate for PCSK9i, however given her medication sensitivity/intolerance I do have some hesitations moving forward with this type of medication.  She agrees.  Could consider referral to lipid clinic for  consideration of bempedoic acid moving forward.  HTN: Blood pressure is well controlled in the office today.  Continue current medical therapy.  Cardiac risk stratification: She indicates that she will need to reschedule her hammertoe surgery.  Per revised risk cardiac index she is low risk for noncardiac surgery.  Per [facility] activity status index she is able to achieve at least 4 METS without cardiac limitation.  Recent cardiac work-up demonstrated nonobstructive CAD with a preserved LV systolic function as outlined above.  She would be able to proceed with noncardiac surgery at an overall low risk without further cardiac testing.  Disposition: F/u with Dr. Rockey Situ or an APP in 6 months.   Medication Adjustments/Labs and Tests Ordered: Current medicines are reviewed at length with the patient today.  Concerns regarding medicines are outlined above. Medication changes, Labs and Tests ordered today are summarized above and listed in the Patient Instructions accessible in Encounters.   Signed, Christell Faith, PA-C 10/08/2020 12:25 PM     [place] [address] [place], [place] [address] [phone]

## 2020-10-08 ENCOUNTER — Encounter: Payer: Self-pay | Admitting: Physician Assistant

## 2020-10-08 ENCOUNTER — Ambulatory Visit (INDEPENDENT_AMBULATORY_CARE_PROVIDER_SITE_OTHER): Payer: Medicare Other | Admitting: Physician Assistant

## 2020-10-08 ENCOUNTER — Other Ambulatory Visit: Payer: Self-pay

## 2020-10-08 VITALS — BP 120/80 | HR 72 | Ht 66.0 in | Wt 187.0 lb

## 2020-10-08 DIAGNOSIS — Z01810 Encounter for preprocedural cardiovascular examination: Secondary | ICD-10-CM

## 2020-10-08 DIAGNOSIS — E785 Hyperlipidemia, unspecified: Secondary | ICD-10-CM | POA: Diagnosis not present

## 2020-10-08 DIAGNOSIS — I2510 Atherosclerotic heart disease of native coronary artery without angina pectoris: Secondary | ICD-10-CM

## 2020-10-08 DIAGNOSIS — I10 Essential (primary) hypertension: Secondary | ICD-10-CM

## 2020-10-08 MED ORDER — SIMVASTATIN 20 MG PO TABS: 20.0000 mg | ORAL_TABLET | Freq: Every day | ORAL | 3 refills | Status: DC

## 2020-10-08 MED ORDER — SIMVASTATIN 5 MG PO TABS: 5.0000 mg | ORAL_TABLET | Freq: Every day | ORAL | 3 refills | Status: DC

## 2020-10-08 NOTE — Patient Instructions (Addendum)
Speak with Dr. Vickki Muff for management of DVT Prophylaxis but these could be used Xarelto, Eliquis, or Lovenox.   Medication Instructions:  Your physician has recommended you make the following change in your medication:   STOP Rosuvastatin (Crestor) START Simvastatin (Zocor) 5 mg once daily at bedtime  *If you need a refill on your cardiac medications before your next appointment, please call your pharmacy*   Lab Work: None  If you have labs (blood work) drawn today and your tests are completely normal, you will receive your results only by: [place] (if you have MyChart) OR A paper copy in the mail If you have any lab test that is abnormal or we need to change your treatment, we will call you to review the results.   Testing/Procedures: None  Follow-Up: At [facility], you and your health needs are our priority.  As part of our continuing mission to provide you with exceptional heart care, we have created designated Provider Care Teams.  These Care Teams include your primary Cardiologist (physician) and Advanced Practice Providers (APPs -  Physician Assistants and Nurse Practitioners) who all work together to provide you with the care you need, when you need it.   Your next appointment:   6 month(s)  The format for your next appointment:   In Person  Provider:   You may see Ida Rogue, MD or one of the following Advanced Practice Providers on your designated Care Team:   Murray Hodgkins, NP Christell Faith, PA-C Marrianne Mood, PA-C [place], [place]

## 2020-10-14 ENCOUNTER — Ambulatory Visit: Payer: Medicare Other | Admitting: Nurse Practitioner

## 2020-10-19 DIAGNOSIS — M2041 Other hammer toe(s) (acquired), right foot: Secondary | ICD-10-CM | POA: Diagnosis not present

## 2020-10-19 DIAGNOSIS — Z86718 Personal history of other venous thrombosis and embolism: Secondary | ICD-10-CM | POA: Diagnosis not present

## 2020-10-21 DIAGNOSIS — M3500 Sicca syndrome, unspecified: Secondary | ICD-10-CM | POA: Diagnosis not present

## 2020-10-21 DIAGNOSIS — L272 Dermatitis due to ingested food: Secondary | ICD-10-CM | POA: Diagnosis not present

## 2020-10-26 ENCOUNTER — Other Ambulatory Visit: Payer: Self-pay | Admitting: Podiatry

## 2020-10-27 ENCOUNTER — Encounter: Payer: Self-pay | Admitting: Anesthesiology

## 2020-10-27 ENCOUNTER — Encounter: Payer: Self-pay | Admitting: Podiatry

## 2020-10-27 NOTE — Anesthesia Preprocedure Evaluation (Deleted)
Anesthesia Evaluation    Airway        Dental   Pulmonary           Cardiovascular hypertension, + CAD (nonobstructive)    Hx PE 2007   Neuro/Psych  Headaches, CVA    GI/Hepatic hiatal hernia, GERD (s/p Nissen)  ,  Endo/Other  Prediabetes   Renal/GU      Musculoskeletal   Abdominal   Peds  Hematology Sjogren syndrome   Anesthesia Other Findings Cardiology note 10/08/20:  ASSESSMENT & PLAN:  1. Nonobstructive CAD: No symptoms concerning for angina.  Recommend aggressive risk factor modification and primary prevention including continuation of aspirin and rechallenge of simvastatin as outlined below.  No indication for further ischemic testing at this time.  2. HLD: LDL 115 in 07/2020 with normal LFT at that time.  Goal LDL less than 70.  Calcium score to 31 which places her in the 76 percentile.  She has been noted to have multiple medication intolerances, including multiple statins.  Historically, she was tolerating pravastatin 10 mg, though did not tolerate 20 mg secondary to sensation of imbalance and mental fogginess.  She has not tolerated the transition to low-dose Crestor for similar reasons.  She is willing to undergo a trial of simvastatin 5 mg nightly.  Would also consider trial of ezetimibe after we see how she tolerates simvastatin.  Ideally, she would be a good candidate for PCSK9i, however given her medication sensitivity/intolerance I do have some hesitations moving forward with this type of medication.  She agrees.  Could consider referral to lipid clinic for consideration of bempedoic acid moving forward.  3. HTN: Blood pressure is well controlled in the office today.  Continue current medical therapy.  4. Cardiac risk stratification: She indicates that she will need to reschedule her hammertoe surgery.  Per revised risk cardiac index she is low risk for noncardiac surgery.  Per [facility] activity status index she is  able to achieve at least 4 METS without cardiac limitation.  Recent cardiac work-up demonstrated nonobstructive CAD with a preserved LV systolic function as outlined above.  She would be able to proceed with noncardiac surgery at an overall low risk without further cardiac testing.  Disposition: F/u with Dr. Rockey Situ or an APP in 6 months.   Reproductive/Obstetrics                             Anesthesia Physical Anesthesia Plan  ASA: 3  Anesthesia Plan: General   Post-op Pain Management:    Induction: Intravenous  PONV Risk Score and Plan: 3 and Ondansetron, Dexamethasone and Treatment may vary due to age or medical condition  Airway Management Planned: LMA  Additional Equipment:   Intra-op Plan:   Post-operative Plan: Extubation in OR  Informed Consent:   Plan Discussed with:   Anesthesia Plan Comments:         Anesthesia Quick Evaluation

## 2020-11-08 DIAGNOSIS — Z20822 Contact with and (suspected) exposure to covid-19: Secondary | ICD-10-CM | POA: Diagnosis not present

## 2020-11-08 DIAGNOSIS — Z03818 Encounter for observation for suspected exposure to other biological agents ruled out: Secondary | ICD-10-CM | POA: Diagnosis not present

## 2020-11-09 ENCOUNTER — Ambulatory Visit: Payer: Medicare Other | Admitting: Nurse Practitioner

## 2020-11-10 ENCOUNTER — Encounter: Payer: Self-pay | Admitting: Nurse Practitioner

## 2020-11-10 ENCOUNTER — Other Ambulatory Visit: Payer: Self-pay

## 2020-11-10 ENCOUNTER — Ambulatory Visit (INDEPENDENT_AMBULATORY_CARE_PROVIDER_SITE_OTHER): Payer: Medicare Other | Admitting: Nurse Practitioner

## 2020-11-10 VITALS — BP 132/65 | HR 76 | Temp 98.7°F | Resp 16 | Ht 66.0 in | Wt 186.2 lb

## 2020-11-10 DIAGNOSIS — J029 Acute pharyngitis, unspecified: Secondary | ICD-10-CM

## 2020-11-10 DIAGNOSIS — R6889 Other general symptoms and signs: Secondary | ICD-10-CM

## 2020-11-10 DIAGNOSIS — J0140 Acute pansinusitis, unspecified: Secondary | ICD-10-CM | POA: Diagnosis not present

## 2020-11-10 DIAGNOSIS — B9689 Other specified bacterial agents as the cause of diseases classified elsewhere: Secondary | ICD-10-CM

## 2020-11-10 DIAGNOSIS — J208 Acute bronchitis due to other specified organisms: Secondary | ICD-10-CM

## 2020-11-10 DIAGNOSIS — R059 Cough, unspecified: Secondary | ICD-10-CM

## 2020-11-10 LAB — POCT RAPID STREP A (OFFICE): Rapid Strep A Screen: NEGATIVE

## 2020-11-10 LAB — POCT INFLUENZA A/B
Influenza A, POC: NEGATIVE
Influenza B, POC: NEGATIVE

## 2020-11-10 MED ORDER — AZITHROMYCIN 250 MG PO TABS: ORAL_TABLET | ORAL | 0 refills | Status: DC

## 2020-11-10 MED ORDER — HYDROCODONE BIT-HOMATROP MBR 5-1.5 MG/5ML PO SOLN: 5.0000 mL | ORAL | 0 refills | Status: DC | PRN

## 2020-11-10 NOTE — Progress Notes (Signed)
[facility] [place], [place] [address]  Internal MEDICINE  Office Visit Note  Patient Name: Virginia Phillips  [id number]  [id number]  Date of Service: 11/10/2020  Chief Complaint  Patient presents with   Acute Visit    Covid test negative   Sinusitis   Sore Throat    Started 11-05-20, wants meds    Cough   Nausea    Vomiting     HPI Virginia Phillips presents for an acute sick visit for symptoms of a URI. Her symptoms started last week on Thursday.  Virginia Phillips reports having fever, chills, fatigue, body aches, nasal congestion, runny nose, postnasal drip, sore throat, cough, SOB, wheezing, chest tightness, headache and nausea and vomiting. Virginia Phillips did a home COVID test that was negative. The most bothersome symptoms have been the cough and sore throat.    Current Medication:  Outpatient Encounter Medications as of 11/10/2020  Medication Sig   acetaminophen (TYLENOL) 650 MG CR tablet Take 650 mg by mouth every 8 (eight) hours as needed for pain.   amLODipine (NORVASC) 5 MG tablet Take 1 tablet (5 mg total) by mouth daily.   aspirin 325 MG tablet Take 81 mg by mouth daily.   aspirin-sod bicarb-citric acid (ALKA-SELTZER) 325 MG TBEF tablet Take 650 mg by mouth every 6 (six) hours as needed (indigestion).   azithromycin (ZITHROMAX) 250 MG tablet Take one tab a day for 10 days for uri   Biotin 5 MG CAPS Take 5,000 mg by mouth daily at 2 PM.   BLACK CURRANT SEED OIL PO Take 1 capsule by mouth daily at 2 PM.   Cholecalciferol (VITAMIN D) 2000 UNITS CAPS Take 2,000 Units by mouth daily at 2 PM.    Coenzyme Q10 (CO Q10) 200 MG CAPS Take 200 mg by mouth daily at 2 PM.    Garlic 7654 MG CAPS Take 6,000 mg by mouth daily as needed.   HYDROcodone bit-homatropine (HYDROMET) 5-1.5 MG/5ML syrup Take 5 mLs by mouth every 4 (four) hours as needed for cough.   ibuprofen (ADVIL,MOTRIN) 200 MG tablet Take 400 mg by mouth daily as needed for headache or moderate pain.    Lancets (ONETOUCH  ULTRASOFT) lancets Check sugar once daily DX 73.9 needs for one touch ultra 2 lancets   Magnesium 100 MG TABS Take by mouth daily.   Magnesium 400 MG TABS Take by mouth daily.   meclizine (ANTIVERT) 12.5 MG tablet One tab po tid prn for dizziness   Misc Natural Products (PETADOLEX 50) 50 MG CAPS Take 100 mg by mouth daily.   multivitamin-lutein (OCUVITE-LUTEIN) CAPS capsule Take 1 capsule by mouth daily.   Omega-3 Fatty Acids (OMEGA-3 FISH OIL) 1200 MG CAPS Take by mouth.   omeprazole (PRILOSEC) 20 MG capsule Take 20 mg by mouth daily as needed.   ONE TOUCH ULTRA TEST test strip CHECK BLOOD SUGAR ONCE DAILY AS DIRECTED   pilocarpine (SALAGEN) 5 MG tablet Take 5 mg by mouth 2 (two) times daily.   polyethylene glycol (MIRALAX / GLYCOLAX) packet Take 17 g by mouth daily.    Polyvinyl Alcohol-Povidone (REFRESH OP) Place 1 drop into both eyes daily as needed (dry eyes).   pyridOXINE (VITAMIN B-6) 100 MG tablet Take 100 mg by mouth daily at 2 PM.   Riboflavin (B2) 100 MG TABS Take 100 mg by mouth 2 (two) times daily.   simvastatin (ZOCOR) 5 MG tablet Take 1 tablet (5 mg total) by mouth daily.   SUPER B COMPLEX/C  PO Take 1 tablet by mouth daily at 2 PM.   Thiamine HCl (VITAMIN B-1) 250 MG tablet Take 250 mg by mouth daily at 2 PM.   valsartan (DIOVAN) 80 MG tablet Take 80 mg by mouth daily at 2 PM.    venlafaxine XR (EFFEXOR-XR) 75 MG 24 hr capsule Take 1 capsule (75 mg total) by mouth daily at 2 PM.   vitamin B-12 (CYANOCOBALAMIN) 1000 MCG tablet Take 1,000 mcg by mouth daily at 2 PM.   vitamin C (ASCORBIC ACID) 500 MG tablet Take 500 mg by mouth daily at 2 PM.   aspirin-acetaminophen-caffeine (EXCEDRIN MIGRAINE) 250-250-65 MG tablet Take 2 tablets by mouth daily as needed for headache or migraine.  (Patient not taking: No sig reported)   No facility-administered encounter medications on file as of 11/10/2020.      Medical History: Past Medical History:  Diagnosis Date   Coronary artery  disease    GERD (gastroesophageal reflux disease)    Glaucoma    Headache    migraines   History of shingles    Hyperlipidemia    Hypertension    Pre-diabetes    Pulmonary embolism on right [facility]) 2007   right lung   Sjogren's syndrome (HCC)    Stroke (HCC) 1998     Vital Signs: BP 132/65   Pulse 76   Temp 98.7 F (37.1 C)   Resp 16   Ht 5' 6"  (1.676 m)   Wt 186 lb 3.2 oz (84.5 kg)   SpO2 95%   BMI 30.05 kg/m    Review of Systems  Constitutional:  Positive for appetite change, chills, fatigue and fever. Negative for diaphoresis.  HENT:  Positive for congestion, ear pain, postnasal drip, rhinorrhea, sinus pressure, sinus pain, sneezing, sore throat and trouble swallowing.   Respiratory:  Positive for cough, chest tightness, shortness of breath and wheezing.   Cardiovascular: Negative.  Negative for chest pain and palpitations.  Gastrointestinal:  Positive for nausea and vomiting. Negative for abdominal pain and diarrhea.  Musculoskeletal:  Positive for myalgias.  Skin: Negative.  Negative for rash.  Neurological:  Positive for dizziness, light-headedness and headaches.   Physical Exam Vitals reviewed.  Constitutional:      Appearance: Virginia Phillips is well-developed. Virginia Phillips is ill-appearing.  HENT:     Head: Normocephalic and atraumatic.     Right Ear: Tympanic membrane and ear canal normal.     Left Ear: Tympanic membrane and ear canal normal.     Nose: Congestion and rhinorrhea present.     Mouth/Throat:     Pharynx: Uvula midline. Pharyngeal swelling and posterior oropharyngeal erythema present.     Tonsils: 2+ on the right. 2+ on the left.  Cardiovascular:     Rate and Rhythm: Normal rate and regular rhythm.     Heart sounds: Normal heart sounds.  Pulmonary:     Effort: Pulmonary effort is normal. No respiratory distress.     Breath sounds: Rales present. No wheezing or rhonchi.  Lymphadenopathy:     Cervical: Cervical adenopathy present.  Skin:    General: Skin is  warm and dry.  Neurological:     Mental Status: Virginia Phillips is alert and oriented to person, place, and time.  Psychiatric:        Mood and Affect: Mood normal.        Behavior: Behavior normal.     Assessment/Plan: 1. Acute non-recurrent pansinusitis Empiric antibiotic treatment prescribed. Allergies reviewed prior to prescribing, Virginia Phillips is allergic to several  antibiotics. Patient instructed to call the clinic if no improvement or worsening of symptoms within the next 7 days.  - azithromycin (ZITHROMAX) 250 MG tablet; Take one tab a day for 10 days for uri  Dispense: 10 tablet; Refill: 0  2. Acute bacterial bronchitis Empiric antibiotic treatment prescribed.  - azithromycin (ZITHROMAX) 250 MG tablet; Take one tab a day for 10 days for uri  Dispense: 10 tablet; Refill: 0  3. Cough Negative for flu A and B, requested cough syrup was prescribed.  - POCT Influenza A/B - HYDROcodone bit-homatropine (HYDROMET) 5-1.5 MG/5ML syrup; Take 5 mLs by mouth every 4 (four) hours as needed for cough.  Dispense: 473 mL; Refill: 0  4. Sore throat Negative for flu A and B and negative for strep - POCT rapid strep A - POCT Influenza A/B  5. Flu-like symptoms Negative for flu A and B - POCT Influenza A/B   General Counseling: Virginia Phillips verbalizes understanding of the findings of todays visit and agrees with plan of treatment. I have discussed any further diagnostic evaluation that may be needed or ordered today. We also reviewed her medications today. Virginia Phillips has been encouraged to call the office with any questions or concerns that should arise related to todays visit.    Counseling:    Orders Placed This Encounter  Procedures   POCT rapid strep A   POCT Influenza A/B    Meds ordered this encounter  Medications   azithromycin (ZITHROMAX) 250 MG tablet    Sig: Take one tab a day for 10 days for uri    Dispense:  10 tablet    Refill:  0   HYDROcodone bit-homatropine (HYDROMET) 5-1.5 MG/5ML syrup     Sig: Take 5 mLs by mouth every 4 (four) hours as needed for cough.    Dispense:  473 mL    Refill:  0    May give generic    Return if symptoms worsen or fail to improve.  [place] Controlled Substance Database was reviewed by me for overdose risk score (ORS)  Time spent:15 Minutes Time spent with patient included reviewing progress notes, labs, imaging studies, and discussing plan for follow up.   This patient was seen by Jonetta Osgood, FNP-C in collaboration with Dr. Clayborn Bigness as a part of collaborative care agreement.  Inioluwa Boulay R. Valetta Fuller, MSN, FNP-C Internal Medicine

## 2020-11-11 ENCOUNTER — Other Ambulatory Visit: Payer: Self-pay | Admitting: Nurse Practitioner

## 2020-11-11 ENCOUNTER — Telehealth: Payer: Self-pay

## 2020-11-11 ENCOUNTER — Encounter: Payer: Self-pay | Admitting: Nurse Practitioner

## 2020-11-11 ENCOUNTER — Encounter: Payer: Self-pay | Admitting: Internal Medicine

## 2020-11-11 DIAGNOSIS — R062 Wheezing: Secondary | ICD-10-CM

## 2020-11-11 MED ORDER — ALBUTEROL SULFATE HFA 108 (90 BASE) MCG/ACT IN AERS: 2.0000 | INHALATION_SPRAY | Freq: Four times a day (QID) | RESPIRATORY_TRACT | 0 refills | Status: DC | PRN

## 2020-11-11 NOTE — Telephone Encounter (Signed)
Pt advised will send albuterol

## 2020-11-11 NOTE — Telephone Encounter (Signed)
Patient called stating medication for her wheezing was not sent into pharmacy. She is requesting call back-Toni

## 2020-11-15 ENCOUNTER — Other Ambulatory Visit: Payer: Medicare Other

## 2020-11-16 ENCOUNTER — Ambulatory Visit: Payer: Medicare Other | Admitting: Cardiovascular Disease

## 2020-11-22 ENCOUNTER — Other Ambulatory Visit: Payer: Medicare Other

## 2020-11-26 ENCOUNTER — Ambulatory Visit: Admission: RE | Admit: 2020-11-26 | Payer: Medicare Other | Source: Home / Self Care | Admitting: Podiatry

## 2020-11-26 SURGERY — OSTEOTOMY, WEIL
Anesthesia: Choice | Site: Toe | Laterality: Right

## 2020-11-30 ENCOUNTER — Ambulatory Visit: Payer: Medicare Other | Admitting: Nurse Practitioner

## 2020-12-16 DIAGNOSIS — Z23 Encounter for immunization: Secondary | ICD-10-CM | POA: Diagnosis not present

## 2020-12-21 DIAGNOSIS — H16223 Keratoconjunctivitis sicca, not specified as Sjogren's, bilateral: Secondary | ICD-10-CM | POA: Diagnosis not present

## 2020-12-21 DIAGNOSIS — H401123 Primary open-angle glaucoma, left eye, severe stage: Secondary | ICD-10-CM | POA: Diagnosis not present

## 2020-12-21 DIAGNOSIS — H401111 Primary open-angle glaucoma, right eye, mild stage: Secondary | ICD-10-CM | POA: Diagnosis not present

## 2020-12-29 ENCOUNTER — Telehealth: Payer: Self-pay | Admitting: Cardiovascular Disease

## 2020-12-29 DIAGNOSIS — I10 Essential (primary) hypertension: Secondary | ICD-10-CM

## 2020-12-29 DIAGNOSIS — Z23 Encounter for immunization: Secondary | ICD-10-CM | POA: Diagnosis not present

## 2020-12-29 NOTE — Telephone Encounter (Signed)
*  STAT* If patient is at the pharmacy, call can be transferred to refill team.   1. Which medications need to be refilled? (please list name of each medication and dose if known) amlodipine 5 mg po q d   2. Which pharmacy/location (including street and city if local pharmacy) is medication to be sent to? [facility] [address]   3. Do they need a 30 day or 90 day supply? [place]

## 2020-12-30 DIAGNOSIS — J328 Other chronic sinusitis: Secondary | ICD-10-CM | POA: Diagnosis not present

## 2020-12-30 DIAGNOSIS — L299 Pruritus, unspecified: Secondary | ICD-10-CM | POA: Diagnosis not present

## 2020-12-30 DIAGNOSIS — R42 Dizziness and giddiness: Secondary | ICD-10-CM | POA: Diagnosis not present

## 2020-12-30 DIAGNOSIS — K219 Gastro-esophageal reflux disease without esophagitis: Secondary | ICD-10-CM | POA: Diagnosis not present

## 2020-12-30 MED ORDER — AMLODIPINE BESYLATE 5 MG PO TABS: 5.0000 mg | ORAL_TABLET | Freq: Every day | ORAL | 0 refills | Status: DC

## 2020-12-30 NOTE — Telephone Encounter (Signed)
Requested Prescriptions   Signed Prescriptions Disp Refills   amLODipine (NORVASC) 5 MG tablet 90 tablet 0    Sig: Take 1 tablet (5 mg total) by mouth daily.    Authorizing Provider: Minna Merritts    Ordering User: Britt Bottom

## 2021-01-19 ENCOUNTER — Other Ambulatory Visit: Payer: Self-pay | Admitting: Nurse Practitioner

## 2021-01-19 DIAGNOSIS — N959 Unspecified menopausal and perimenopausal disorder: Secondary | ICD-10-CM

## 2021-01-24 ENCOUNTER — Encounter: Payer: Self-pay | Admitting: Internal Medicine

## 2021-01-24 ENCOUNTER — Other Ambulatory Visit: Payer: Self-pay

## 2021-01-24 ENCOUNTER — Telehealth (INDEPENDENT_AMBULATORY_CARE_PROVIDER_SITE_OTHER): Payer: Medicare Other | Admitting: Internal Medicine

## 2021-01-24 DIAGNOSIS — Z03818 Encounter for observation for suspected exposure to other biological agents ruled out: Secondary | ICD-10-CM | POA: Diagnosis not present

## 2021-01-24 DIAGNOSIS — J45909 Unspecified asthma, uncomplicated: Secondary | ICD-10-CM

## 2021-01-24 DIAGNOSIS — Z20822 Contact with and (suspected) exposure to covid-19: Secondary | ICD-10-CM | POA: Diagnosis not present

## 2021-01-24 MED ORDER — AZITHROMYCIN 250 MG PO TABS: ORAL_TABLET | ORAL | 0 refills | Status: DC

## 2021-01-24 NOTE — Progress Notes (Signed)
[facility] [place], [place] [address]  Internal MEDICINE  Telephone Visit  Patient Name: Virginia Phillips  [id number]  [id number]  Date of Service: 01/24/2021  I connected with the patient at 1:42pm by telephone and verified the patients identity using two identifiers.   I discussed the limitations, risks, security and privacy concerns of performing an evaluation and management service by telephone and the availability of in person appointments. I also discussed with the patient that there may be a patient responsible charge related to the service.  The patient expressed understanding and agrees to proceed.    Chief Complaint  Patient presents with   Telephone Assessment    Video [phone]   Telephone Screen   Sore Throat    Symptoms started last week before Thanksgiving.   Laryngitis   Ear Pain    bilateral   Cough   sneezing   Headache   Generalized Body Aches    HPI Pt is connected for acute and sick visit C/o sore throat for few days, does not to be sick around holidays  Patient is also having bilateral ear pain, she would like to get antibiotics as she does not want to be sick denies any fever or chills   Current Medication: Outpatient Encounter Medications as of 01/24/2021  Medication Sig   acetaminophen (TYLENOL) 650 MG CR tablet Take 650 mg by mouth every 8 (eight) hours as needed for pain.   albuterol (VENTOLIN HFA) 108 (90 Base) MCG/ACT inhaler INHALE 2 PUFFS INTO THE LUNGS EVERY 6 HOURS AS NEEDED FOR WHEEZING OR SHORTNESS OF BREATH   amLODipine (NORVASC) 5 MG tablet Take 1 tablet (5 mg total) by mouth daily.   aspirin 325 MG tablet Take 81 mg by mouth daily.   aspirin-acetaminophen-caffeine (EXCEDRIN MIGRAINE) 250-250-65 MG tablet Take 2 tablets by mouth daily as needed for headache or migraine.   aspirin-sod bicarb-citric acid (ALKA-SELTZER) 325 MG TBEF tablet Take 650 mg by mouth every 6 (six) hours as needed (indigestion).    azithromycin (ZITHROMAX) 250 MG tablet Take one tab a day for 10 days for uri   Biotin 5 MG CAPS Take 5,000 mg by mouth daily at 2 PM.   BLACK CURRANT SEED OIL PO Take 1 capsule by mouth daily at 2 PM.   Cholecalciferol (VITAMIN D) 2000 UNITS CAPS Take 2,000 Units by mouth daily at 2 PM.    Coenzyme Q10 (CO Q10) 200 MG CAPS Take 200 mg by mouth daily at 2 PM.    Garlic 5809 MG CAPS Take 6,000 mg by mouth daily as needed.   HYDROcodone bit-homatropine (HYDROMET) 5-1.5 MG/5ML syrup Take 5 mLs by mouth every 4 (four) hours as needed for cough.   ibuprofen (ADVIL,MOTRIN) 200 MG tablet Take 400 mg by mouth daily as needed for headache or moderate pain.    Lancets (ONETOUCH ULTRASOFT) lancets Check sugar once daily DX 73.9 needs for one touch ultra 2 lancets   Magnesium 100 MG TABS Take by mouth daily.   Magnesium 400 MG TABS Take by mouth daily.   meclizine (ANTIVERT) 12.5 MG tablet One tab po tid prn for dizziness   Misc Natural Products (PETADOLEX 50) 50 MG CAPS Take 100 mg by mouth daily.   multivitamin-lutein (OCUVITE-LUTEIN) CAPS capsule Take 1 capsule by mouth daily.   Omega-3 Fatty Acids (OMEGA-3 FISH OIL) 1200 MG CAPS Take by mouth.   omeprazole (PRILOSEC) 20 MG capsule Take 20 mg by mouth daily as needed.  ONE TOUCH ULTRA TEST test strip CHECK BLOOD SUGAR ONCE DAILY AS DIRECTED   pilocarpine (SALAGEN) 5 MG tablet Take 5 mg by mouth 2 (two) times daily.   polyethylene glycol (MIRALAX / GLYCOLAX) packet Take 17 g by mouth daily.    Polyvinyl Alcohol-Povidone (REFRESH OP) Place 1 drop into both eyes daily as needed (dry eyes).   pyridOXINE (VITAMIN B-6) 100 MG tablet Take 100 mg by mouth daily at 2 PM.   Riboflavin (B2) 100 MG TABS Take 100 mg by mouth 2 (two) times daily.   simvastatin (ZOCOR) 5 MG tablet Take 1 tablet (5 mg total) by mouth daily.   SUPER B COMPLEX/C PO Take 1 tablet by mouth daily at 2 PM.   Thiamine HCl (VITAMIN B-1) 250 MG tablet Take 250 mg by mouth daily at 2 PM.    valsartan (DIOVAN) 80 MG tablet Take 80 mg by mouth daily at 2 PM.    venlafaxine XR (EFFEXOR-XR) 75 MG 24 hr capsule TAKE 1 CAPSULE(75 MG) BY MOUTH DAILY AT 2 PM   vitamin B-12 (CYANOCOBALAMIN) 1000 MCG tablet Take 1,000 mcg by mouth daily at 2 PM.   vitamin C (ASCORBIC ACID) 500 MG tablet Take 500 mg by mouth daily at 2 PM.   [DISCONTINUED] azithromycin (ZITHROMAX) 250 MG tablet Take one tab a day for 10 days for uri   No facility-administered encounter medications on file as of 01/24/2021.    Surgical History: Past Surgical History:  Procedure Laterality Date   ANKLE FRACTURE SURGERY Right 2007   COLONOSCOPY WITH PROPOFOL N/A 05/10/2017   Procedure: COLONOSCOPY WITH PROPOFOL;  Surgeon: [name], MD;  Location: [facility] ENDOSCOPY;  Service: Endoscopy;  Laterality: N/A;   ESOPHAGOGASTRODUODENOSCOPY (EGD) WITH PROPOFOL N/A 10/17/2017   Procedure: ESOPHAGOGASTRODUODENOSCOPY (EGD) WITH PROPOFOL;  Surgeon: [name], MD;  Location: [facility] ENDOSCOPY;  Service: Endoscopy;  Laterality: N/A;   GANGLION CYST EXCISION Right    wrist   HERNIA REPAIR     2011   KNEE ARTHROSCOPY W/ ACL RECONSTRUCTION Right 2008   KNEE SURGERY  08/05/2007   LASIK     NISSEN FUNDOPLICATION  [id number]    Medical History: Past Medical History:  Diagnosis Date   Coronary artery disease    GERD (gastroesophageal reflux disease)    Glaucoma    Headache    migraines   History of shingles    Hyperlipidemia    Hypertension    Pre-diabetes    Pulmonary embolism on right [facility]) 2007   right lung   Sjogren's syndrome ([place])    Stroke ([place]) 1998    Family History: Family History  Problem Relation Age of Onset   Congestive Heart Failure Mother    Parkinson's disease Mother    Diabetes Father    Scleroderma Sister    Cancer Brother     Social History   Socioeconomic History   Marital status: Married    Spouse name: Not on file   Number of children: 1   Years of education: Not on file   Highest  education level: Some college, no degree  Occupational History   Occupation: retired  Tobacco Use   Smoking status: Never   Smokeless tobacco: Never  Vaping Use   Vaping Use: Never used  Substance and Sexual Activity   Alcohol use: No    Alcohol/week: 0.0 standard drinks   Drug use: No   Sexual activity: Not on file  Other Topics Concern   Not on file  Social History Narrative   Not on file   Social Determinants of Health   Financial Resource Strain: Not on file  Food Insecurity: Not on file  Transportation Needs: Not on file  Physical Activity: Not on file  Stress: Not on file  Social Connections: Not on file  Intimate Partner Violence: Not on file      Review of Systems  Constitutional:  Negative for fatigue and fever.  HENT:  Positive for ear pain. Negative for congestion, mouth sores and postnasal drip.   Respiratory:  Positive for cough.   Cardiovascular:  Negative for chest pain.  Genitourinary:  Negative for flank pain.  Psychiatric/Behavioral: Negative.     Vital Signs: There were no vitals taken for this visit.   Observation/Objective: NAD.     Assessment/Plan: 1. Acute asthmatic bronchitis Patient does not want to wait for the antibiotics she is scheduled refill getting her chest and would like to have the same antibiotics that was given in the previous visits when she was seen  - azithromycin (ZITHROMAX) 250 MG tablet; Take one tab a day for 10 days for uri  Dispense: 10 tablet; Refill: 0   General Counseling: Elmina verbalizes understanding of the findings of today's phone visit and agrees with plan of treatment. I have discussed any further diagnostic evaluation that may be needed or ordered today. We also reviewed her medications today. she has been encouraged to call the office with any questions or concerns that should arise related to todays visit.    No orders of the defined types were placed in this encounter.    Meds ordered this  encounter  Medications   azithromycin (ZITHROMAX) 250 MG tablet    Sig: Take one tab a day for 10 days for uri    Dispense:  10 tablet    Refill:  0     Time spent:15 Minutes    Dr Lavera Guise Internal medicine

## 2021-01-25 ENCOUNTER — Telehealth: Payer: Self-pay

## 2021-01-25 NOTE — Telephone Encounter (Signed)
Pt called and advised she took a rapid and PCR Covid test and both came back negative.

## 2021-02-25 ENCOUNTER — Ambulatory Visit: Payer: Medicare Other | Admitting: Nurse Practitioner

## 2021-03-01 DIAGNOSIS — Z20822 Contact with and (suspected) exposure to covid-19: Secondary | ICD-10-CM | POA: Diagnosis not present

## 2021-03-01 DIAGNOSIS — Z03818 Encounter for observation for suspected exposure to other biological agents ruled out: Secondary | ICD-10-CM | POA: Diagnosis not present

## 2021-03-03 ENCOUNTER — Ambulatory Visit (INDEPENDENT_AMBULATORY_CARE_PROVIDER_SITE_OTHER): Payer: Medicare Other | Admitting: Nurse Practitioner

## 2021-03-03 ENCOUNTER — Other Ambulatory Visit: Payer: Self-pay

## 2021-03-03 ENCOUNTER — Encounter: Payer: Self-pay | Admitting: Nurse Practitioner

## 2021-03-03 VITALS — BP 132/78 | HR 90 | Temp 97.7°F | Resp 16 | Ht 66.0 in | Wt 185.6 lb

## 2021-03-03 DIAGNOSIS — M25511 Pain in right shoulder: Secondary | ICD-10-CM

## 2021-03-03 DIAGNOSIS — M159 Polyosteoarthritis, unspecified: Secondary | ICD-10-CM | POA: Diagnosis not present

## 2021-03-03 DIAGNOSIS — M5442 Lumbago with sciatica, left side: Secondary | ICD-10-CM | POA: Diagnosis not present

## 2021-03-03 DIAGNOSIS — M5441 Lumbago with sciatica, right side: Secondary | ICD-10-CM | POA: Diagnosis not present

## 2021-03-03 DIAGNOSIS — M25512 Pain in left shoulder: Secondary | ICD-10-CM

## 2021-03-03 MED ORDER — METHOCARBAMOL 500 MG PO TABS: 500.0000 mg | ORAL_TABLET | Freq: Three times a day (TID) | ORAL | 0 refills | Status: DC

## 2021-03-03 NOTE — Progress Notes (Signed)
[facility] [place], [place] [address]  Internal MEDICINE  Office Visit Note  Patient Name: Virginia Phillips  [id number]  [id number]  Date of Service: 03/03/2021  Chief Complaint  Patient presents with   Acute Visit    Shoulders hurt, sciatica, muscles in legs feel like they're spasming   Back Pain   Headache     HPI Eveny presents for an acute sick visit for pain in shoulders, low back pain with sciatica, patient feels like muscles in legs are spasming. She has been taking tylenol and motrin alternating and stretching.     Current Medication:  Outpatient Encounter Medications as of 03/03/2021  Medication Sig   acetaminophen (TYLENOL) 650 MG CR tablet Take 650 mg by mouth every 8 (eight) hours as needed for pain.   albuterol (VENTOLIN HFA) 108 (90 Base) MCG/ACT inhaler INHALE 2 PUFFS INTO THE LUNGS EVERY 6 HOURS AS NEEDED FOR WHEEZING OR SHORTNESS OF BREATH   amLODipine (NORVASC) 5 MG tablet Take 1 tablet (5 mg total) by mouth daily.   aspirin 325 MG tablet Take 81 mg by mouth daily.   aspirin-acetaminophen-caffeine (EXCEDRIN MIGRAINE) 250-250-65 MG tablet Take 2 tablets by mouth daily as needed for headache or migraine.   aspirin-sod bicarb-citric acid (ALKA-SELTZER) 325 MG TBEF tablet Take 650 mg by mouth every 6 (six) hours as needed (indigestion).   azithromycin (ZITHROMAX) 250 MG tablet Take one tab a day for 10 days for uri   Biotin 5 MG CAPS Take 5,000 mg by mouth daily at 2 PM.   BLACK CURRANT SEED OIL PO Take 1 capsule by mouth daily at 2 PM.   Cholecalciferol (VITAMIN D) 2000 UNITS CAPS Take 2,000 Units by mouth daily at 2 PM.    Coenzyme Q10 (CO Q10) 200 MG CAPS Take 200 mg by mouth daily at 2 PM.    Garlic 8676 MG CAPS Take 6,000 mg by mouth daily as needed.   HYDROcodone bit-homatropine (HYDROMET) 5-1.5 MG/5ML syrup Take 5 mLs by mouth every 4 (four) hours as needed for cough.   ibuprofen (ADVIL,MOTRIN) 200 MG tablet Take 400 mg by mouth daily  as needed for headache or moderate pain.    Lancets (ONETOUCH ULTRASOFT) lancets Check sugar once daily DX 73.9 needs for one touch ultra 2 lancets   Magnesium 100 MG TABS Take by mouth daily.   Magnesium 400 MG TABS Take by mouth daily.   meclizine (ANTIVERT) 12.5 MG tablet One tab po tid prn for dizziness   methocarbamol (ROBAXIN) 500 MG tablet Take 1 tablet (500 mg total) by mouth 3 (three) times daily.   Misc Natural Products (PETADOLEX 50) 50 MG CAPS Take 100 mg by mouth daily.   multivitamin-lutein (OCUVITE-LUTEIN) CAPS capsule Take 1 capsule by mouth daily.   Omega-3 Fatty Acids (OMEGA-3 FISH OIL) 1200 MG CAPS Take by mouth.   omeprazole (PRILOSEC) 20 MG capsule Take 20 mg by mouth daily as needed.   ONE TOUCH ULTRA TEST test strip CHECK BLOOD SUGAR ONCE DAILY AS DIRECTED   pilocarpine (SALAGEN) 5 MG tablet Take 5 mg by mouth 2 (two) times daily.   polyethylene glycol (MIRALAX / GLYCOLAX) packet Take 17 g by mouth daily.    Polyvinyl Alcohol-Povidone (REFRESH OP) Place 1 drop into both eyes daily as needed (dry eyes).   pyridOXINE (VITAMIN B-6) 100 MG tablet Take 100 mg by mouth daily at 2 PM.   Riboflavin (B2) 100 MG TABS Take 100 mg by mouth 2 (  two) times daily.   simvastatin (ZOCOR) 5 MG tablet Take 1 tablet (5 mg total) by mouth daily.   SUPER B COMPLEX/C PO Take 1 tablet by mouth daily at 2 PM.   Thiamine HCl (VITAMIN B-1) 250 MG tablet Take 250 mg by mouth daily at 2 PM.   valsartan (DIOVAN) 80 MG tablet Take 80 mg by mouth daily at 2 PM.    venlafaxine XR (EFFEXOR-XR) 75 MG 24 hr capsule TAKE 1 CAPSULE(75 MG) BY MOUTH DAILY AT 2 PM   vitamin B-12 (CYANOCOBALAMIN) 1000 MCG tablet Take 1,000 mcg by mouth daily at 2 PM.   vitamin C (ASCORBIC ACID) 500 MG tablet Take 500 mg by mouth daily at 2 PM.   No facility-administered encounter medications on file as of 03/03/2021.      Medical History: Past Medical History:  Diagnosis Date   Coronary artery disease    GERD  (gastroesophageal reflux disease)    Glaucoma    Headache    migraines   History of shingles    Hyperlipidemia    Hypertension    Pre-diabetes    Pulmonary embolism on right [facility]) 2007   right lung   Sjogren's syndrome (HCC)    Stroke (HCC) 1998     Vital Signs: BP 132/78    Pulse 90    Temp 97.7 F (36.5 C)    Resp 16    Ht 5' 6"  (1.676 m)    Wt 185 lb 9.6 oz (84.2 kg)    SpO2 96%    BMI 29.96 kg/m    Review of Systems  Constitutional:  Negative for chills, fatigue and unexpected weight change.  HENT:  Negative for congestion, rhinorrhea, sneezing and sore throat.   Eyes:  Negative for redness.  Respiratory:  Negative for cough, chest tightness and shortness of breath.   Cardiovascular:  Negative for chest pain and palpitations.  Gastrointestinal:  Negative for abdominal pain, constipation, diarrhea, nausea and vomiting.  Genitourinary:  Negative for dysuria and frequency.  Musculoskeletal:  Positive for arthralgias, back pain and myalgias. Negative for joint swelling and neck pain.  Skin:  Negative for rash.  Neurological: Negative.  Negative for tremors and numbness.  Hematological:  Negative for adenopathy. Does not bruise/bleed easily.  Psychiatric/Behavioral:  Negative for behavioral problems (Depression), sleep disturbance and suicidal ideas. The patient is not nervous/anxious.    Physical Exam Vitals reviewed.  Constitutional:      General: She is not in acute distress.    Appearance: Normal appearance. She is well-developed. She is not ill-appearing.  HENT:     Head: Normocephalic and atraumatic.  Eyes:     Pupils: Pupils are equal, round, and reactive to light.  Cardiovascular:     Rate and Rhythm: Normal rate and regular rhythm.  Pulmonary:     Effort: Pulmonary effort is normal. No respiratory distress.  Neurological:     Mental Status: She is alert and oriented to person, place, and time.  Psychiatric:        Mood and Affect: Mood normal.         Behavior: Behavior normal.      Assessment/Plan: 1. Acute midline low back pain with bilateral sciatica Muscle relaxer prescribed, patient will also continue to alternate OTC tylenol and motrin as needed for pain. Encouraged patient to try icing the low back and shoulder joints. She can also apply heat just before mild stretching. Encouraged patient to go to [facility] if the back pain  or shoulder pain continues to bother her, patient is agreeable to plan.  - methocarbamol (ROBAXIN) 500 MG tablet; Take 1 tablet (500 mg total) by mouth 3 (three) times daily.  Dispense: 90 tablet; Refill: 0  2. Acute pain of both shoulders See problem #1.  - methocarbamol (ROBAXIN) 500 MG tablet; Take 1 tablet (500 mg total) by mouth 3 (three) times daily.  Dispense: 90 tablet; Refill: 0  3. Primary osteoarthritis involving multiple joints Has arthritis in multiple joints including back and shoulders.  - methocarbamol (ROBAXIN) 500 MG tablet; Take 1 tablet (500 mg total) by mouth 3 (three) times daily.  Dispense: 90 tablet; Refill: 0   General Counseling: Wenda verbalizes understanding of the findings of todays visit and agrees with plan of treatment. I have discussed any further diagnostic evaluation that may be needed or ordered today. We also reviewed her medications today. she has been encouraged to call the office with any questions or concerns that should arise related to todays visit.    Counseling:    No orders of the defined types were placed in this encounter.   Meds ordered this encounter  Medications   methocarbamol (ROBAXIN) 500 MG tablet    Sig: Take 1 tablet (500 mg total) by mouth 3 (three) times daily.    Dispense:  90 tablet    Refill:  0    Return if symptoms worsen or fail to improve. Also may go to [facility] if the shoulder or low back pain continue to bother her.   [place] Controlled Substance Database was reviewed by me for overdose risk score  (ORS)  Time spent:30 Minutes Time spent with patient included reviewing progress notes, labs, imaging studies, and discussing plan for follow up.   This patient was seen by Jonetta Osgood, FNP-C in collaboration with Dr. Clayborn Bigness as a part of collaborative care agreement.  Tehani Mersman R. Valetta Fuller, MSN, FNP-C Internal Medicine

## 2021-03-25 ENCOUNTER — Other Ambulatory Visit: Payer: Self-pay | Admitting: Cardiovascular Disease

## 2021-03-25 DIAGNOSIS — I10 Essential (primary) hypertension: Secondary | ICD-10-CM

## 2021-04-11 ENCOUNTER — Ambulatory Visit (INDEPENDENT_AMBULATORY_CARE_PROVIDER_SITE_OTHER): Payer: Medicare Other

## 2021-04-11 ENCOUNTER — Encounter: Payer: Self-pay | Admitting: Cardiovascular Disease

## 2021-04-11 ENCOUNTER — Ambulatory Visit (INDEPENDENT_AMBULATORY_CARE_PROVIDER_SITE_OTHER): Payer: Medicare Other | Admitting: Cardiovascular Disease

## 2021-04-11 ENCOUNTER — Other Ambulatory Visit: Payer: Self-pay

## 2021-04-11 VITALS — BP 100/60 | HR 92 | Ht 67.0 in | Wt 185.2 lb

## 2021-04-11 DIAGNOSIS — I10 Essential (primary) hypertension: Secondary | ICD-10-CM

## 2021-04-11 DIAGNOSIS — I25118 Atherosclerotic heart disease of native coronary artery with other forms of angina pectoris: Secondary | ICD-10-CM

## 2021-04-11 DIAGNOSIS — I479 Paroxysmal tachycardia, unspecified: Secondary | ICD-10-CM

## 2021-04-11 DIAGNOSIS — E782 Mixed hyperlipidemia: Secondary | ICD-10-CM

## 2021-04-11 DIAGNOSIS — R002 Palpitations: Secondary | ICD-10-CM

## 2021-04-11 NOTE — Progress Notes (Addendum)
Cardiology Office Note   Date:  04/11/2021   ID:  Virginia Phillips, Virginia Phillips 09/05/1945, MRN [id number]  PCP:  Lavera Guise, MD  Cardiologist:   Ida Rogue, MD   Chief Complaint  Patient presents with   6 month follow up     Patient c/o pounding/racing heartbeats at times. Medications reviewed by the patient verbally.       History of Present Illness: Virginia Phillips is a 76 y.o. female who presents for  Nonsmoker, second hand 3 ppd Borderline diabetes Hypertension Ankle surgery, debility Obstructive sleep apnea Carotid disease, aortic atherosclerosis per outside cardiology records not available but presenting for f/u of her chest pain symptoms, palpitations  When rushing, will get fluttering Takes a while to slow down Unclear if having cardiac arrhythmia  Some leg swelling, comes and goes Some days worse than others   Cardiac CTA, reviewed Nonobstructive disease noted, calcium score 260 Findings above cannot explain any chest pain symptoms No catheterization needed We do need to get cholesterol down further Options include increasing the pravastatin up to 20 or 40, or increasing pravastatin and add Zetia 10 mg daily continue aspirin but does not need to be 325 can do 81  There is a 4.9 X 3.5 X 2.2 mm left to right interatrial communication. Cannot exclude PFO.  Echo reviewed  1. Left ventricular ejection fraction, by estimation, is 60 to 65%. The  left ventricle has normal function. Left ventricular endocardial border  not optimally defined to evaluate regional wall motion. There is mild left  ventricular hypertrophy. Left  ventricular diastolic parameters are consistent with Grade I diastolic  dysfunction (impaired relaxation).   2. Right ventricular systolic function is normal. The right ventricular  size is normal.   3. The mitral valve is grossly normal. No evidence of mitral valve  regurgitation. No evidence of mitral stenosis.   4. The aortic valve is  normal in structure. Aortic valve regurgitation is  not visualized. No aortic stenosis is present.   5. The inferior vena cava is normal in size with greater than 50%  respiratory variability, suggesting right atrial pressure of 3 mmHg.   6. Agitated saline contrast bubble study was negative, with no evidence  of any interatrial shunt.   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, poor R wave progression to the anterior precordial leads   Past Medical History:  Diagnosis Date   Coronary artery disease    GERD (gastroesophageal reflux disease)    Glaucoma    Headache    migraines   History of shingles    Hyperlipidemia    Hypertension    Pre-diabetes    Pulmonary embolism on right [facility]) 2007   right lung   Sjogren's syndrome ([place])    Stroke ([place]) 1998    Past Surgical History:  Procedure Laterality Date   ANKLE FRACTURE SURGERY Right 2007   COLONOSCOPY WITH PROPOFOL N/A 05/10/2017   Procedure: COLONOSCOPY WITH PROPOFOL;  Surgeon: [name], MD;  Location: [facility] ENDOSCOPY;  Service: Endoscopy;  Laterality: N/A;   ESOPHAGOGASTRODUODENOSCOPY (EGD) WITH PROPOFOL N/A 10/17/2017   Procedure: ESOPHAGOGASTRODUODENOSCOPY (EGD) WITH PROPOFOL;  Surgeon: [name], MD;  Location: [facility] ENDOSCOPY;  Service: Endoscopy;  Laterality: N/A;  GANGLION CYST EXCISION Right    wrist   HERNIA REPAIR     2011   KNEE ARTHROSCOPY W/ ACL RECONSTRUCTION Right 2008   KNEE SURGERY  08/05/2007   LASIK     NISSEN FUNDOPLICATION  [id number]     Current Outpatient Medications  Medication Sig Dispense Refill   acetaminophen (TYLENOL) 650 MG CR tablet Take 650 mg by mouth every 8 (eight) hours as needed for pain.     albuterol (VENTOLIN HFA) 108 (90 Base) MCG/ACT inhaler  INHALE 2 PUFFS INTO THE LUNGS EVERY 6 HOURS AS NEEDED FOR WHEEZING OR SHORTNESS OF BREATH 20.1 g 1   amLODipine (NORVASC) 5 MG tablet TAKE 1 TABLET(5 MG) BY MOUTH DAILY 90 tablet 0   ASPIRIN 81 PO Take 81 mg by mouth daily.     aspirin-acetaminophen-caffeine (EXCEDRIN MIGRAINE) 250-250-65 MG tablet Take 2 tablets by mouth daily as needed for headache or migraine.     aspirin-sod bicarb-citric acid (ALKA-SELTZER) 325 MG TBEF tablet Take 650 mg by mouth every 6 (six) hours as needed (indigestion).     Biotin 5 MG CAPS Take 5,000 mg by mouth daily at 2 PM.     BLACK CURRANT SEED OIL PO Take 1 capsule by mouth daily at 2 PM.     Cholecalciferol (VITAMIN D) 2000 UNITS CAPS Take 2,000 Units by mouth daily at 2 PM.      Coenzyme Q10 (CO Q10) 200 MG CAPS Take 200 mg by mouth daily at 2 PM.      ibuprofen (ADVIL,MOTRIN) 200 MG tablet Take 400 mg by mouth daily as needed for headache or moderate pain.      Lancets (ONETOUCH ULTRASOFT) lancets Check sugar once daily DX 73.9 needs for one touch ultra 2 lancets 50 each 12   Magnesium 100 MG TABS Take by mouth daily.     Magnesium 400 MG TABS Take by mouth daily.     meclizine (ANTIVERT) 12.5 MG tablet One tab po tid prn for dizziness 30 tablet 1   methocarbamol (ROBAXIN) 500 MG tablet Take 1 tablet (500 mg total) by mouth 3 (three) times daily. 90 tablet 0   Misc Natural Products (PETADOLEX 50) 50 MG CAPS Take 100 mg by mouth daily.     Omega-3 Fatty Acids (OMEGA-3 FISH OIL) 1200 MG CAPS Take by mouth.     omeprazole (PRILOSEC) 20 MG capsule Take 20 mg by mouth daily as needed.     ONE TOUCH ULTRA TEST test strip CHECK BLOOD SUGAR ONCE DAILY AS DIRECTED 100 each 3   pilocarpine (SALAGEN) 5 MG tablet Take 5 mg by mouth 2 (two) times daily.     polyethylene glycol (MIRALAX / GLYCOLAX) packet Take 17 g by mouth daily.      Polyvinyl Alcohol-Povidone (REFRESH OP) Place 1 drop into both eyes daily as needed (dry eyes).     pyridOXINE (VITAMIN B-6) 100 MG tablet  Take 100 mg by mouth daily at 2 PM.     Riboflavin (B2) 100 MG TABS Take 100 mg by mouth 2 (two) times daily.     simvastatin (ZOCOR) 5 MG tablet Take 1 tablet (5 mg total) by mouth daily. 90 tablet 3   SUPER B COMPLEX/C PO Take 1 tablet by mouth daily at 2 PM.     Thiamine HCl (VITAMIN B-1) 250 MG tablet Take 250 mg by mouth daily at 2 PM.     valsartan (DIOVAN) 80 MG tablet Take 80 mg by mouth daily at  2 PM.   3   venlafaxine XR (EFFEXOR-XR) 75 MG 24 hr capsule TAKE 1 CAPSULE(75 MG) BY MOUTH DAILY AT 2 PM 90 capsule 1   vitamin B-12 (CYANOCOBALAMIN) 1000 MCG tablet Take 1,000 mcg by mouth daily at 2 PM.     vitamin C (ASCORBIC ACID) 500 MG tablet Take 500 mg by mouth daily at 2 PM.     azithromycin (ZITHROMAX) 250 MG tablet Take one tab a day for 10 days for uri (Patient not taking: Reported on 04/11/2021) 10 tablet 0   Garlic 5852 MG CAPS Take 6,000 mg by mouth daily as needed. (Patient not taking: Reported on 04/11/2021)     HYDROcodone bit-homatropine (HYDROMET) 5-1.5 MG/5ML syrup Take 5 mLs by mouth every 4 (four) hours as needed for cough. (Patient not taking: Reported on 04/11/2021) 473 mL 0   multivitamin-lutein (OCUVITE-LUTEIN) CAPS capsule Take 1 capsule by mouth daily. (Patient not taking: Reported on 04/11/2021)     No current facility-administered medications for this visit.    Allergies:   Linzess [linaclotide], Amoxicillin, Blue dyes (parenteral), Butalbital-aspirin-caffeine, Clindamycin/lincomycin, Clobetasol, Dexlansoprazole, Diphenhydramine, Doxycycline, Esomeprazole, Gabapentin, Green dye, Iodinated contrast media, Meloxicam, Metronidazole, Nortriptyline hcl, Prednisolone acetate, Tetracycline, Baby oil, Cortisone, Diphenhydramine hcl, Levofloxacin, Mineral oil, Prednisone, and Sulfa antibiotics    Social History:  The patient  reports that she has never smoked. She has never used smokeless tobacco. She reports that she does not drink alcohol and does not use drugs.   Family  History:  The patient's family history includes Cancer in her brother; Congestive Heart Failure in her mother; Diabetes in her father; Parkinson's disease in her mother; Scleroderma in her sister.    ROS:   Review of Systems  Constitutional: Negative.   HENT: Negative.    Respiratory: Negative.    Cardiovascular:  Positive for palpitations and leg swelling.  Gastrointestinal: Negative.   Musculoskeletal: Negative.   Neurological: Negative.   Psychiatric/Behavioral: Negative.    All other systems reviewed and are negative.   PHYSICAL EXAM: VS:  BP 100/60 (BP Location: Left Arm, Patient Position: Sitting, Cuff Size: Normal)    Pulse 92    Ht 5' 7"  (1.702 m)    Wt 185 lb 4 oz (84 kg)    SpO2 97%    BMI 29.01 kg/m  , BMI Body mass index is 29.01 kg/m. Constitutional:  oriented to person, place, and time. No distress.  HENT:  Head: Grossly normal Eyes:  no discharge. No scleral icterus.  Neck: No JVD, no carotid bruits  Cardiovascular: Regular rate and rhythm, no murmurs appreciated Pulmonary/Chest: Clear to auscultation bilaterally, no wheezes or rails Abdominal: Soft.  no distension.  no tenderness.  Musculoskeletal: Normal range of motion Neurological:  normal muscle tone. Coordination normal. No atrophy Skin: Skin warm and dry Psychiatric: normal affect, pleasant  Recent Labs: 08/23/2020: ALT 23; BUN 16; Creatinine, Ser 0.88; Hemoglobin 15.4; Platelets 302; Potassium 5.1; Sodium 140; TSH 1.500    Lipid Panel    Component Value Date/Time   CHOL 214 (H) 08/23/2020 1432   CHOL 166 03/06/2012 1512   TRIG 218 (H) 08/23/2020 1432   TRIG 93 03/06/2012 1512   HDL 61 08/23/2020 1432   HDL 76 (H) 03/06/2012 1512   CHOLHDL 3.5 08/23/2020 1432   CHOLHDL 2.9 12/14/2016 1339   VLDL 19 03/06/2012 1512   LDLCALC 115 (H) 08/23/2020 1432   LDLCALC 96 12/14/2016 1339   LDLCALC 71 03/06/2012 1512      Wt Readings from  Last 3 Encounters:  04/11/21 185 lb 4 oz (84 kg)  03/03/21 185  lb 9.6 oz (84.2 kg)  11/10/20 186 lb 3.2 oz (84.5 kg)      PAD Screen 08/12/2020  Previous PAD dx? No  Previous surgical procedure? No  Pain with walking? Yes  Subsides with rest? Yes  Feet/toe relief with dangling? No  Painful, non-healing ulcers? No  Extremities discolored? No    ASSESSMENT AND PLAN:  Chest pain/angina Cardiac CTA reviewed, nonobstructive disease Echocardiogram normal ejection fraction  Essential hypertension Blood pressure is well controlled on today's visit. No changes made to the medications.  Leg swelling Unable to exclude diastolic CHF Versus venous insufficiency Recommend she could try Lasix 20 with potassium 10 as needed Recommend she take sparingly  Hyperlipidemia Continue pravastatin goal LDL less than 70  Coronary disease Nonobstructive disease on cardiac CTA Recommend she stay on her statin,  no further ischemic work-up needed  Paroxysmal tachycardia Paroxysmal, etiology unclear, unable to exclude atrial fibrillation/flutter Recommended a ZIO monitor for further evaluation   Total encounter time more than 30 minutes  Greater than 50% was spent in counseling and coordination of care with the patient   Signed,  Ida Rogue, MD  04/11/2021 5:43 PM    [place]

## 2021-04-11 NOTE — Patient Instructions (Addendum)
Medication Instructions:  No changes  If you need a refill on your cardiac medications before your next appointment, please call your pharmacy.   Lab work: No new labs needed  Testing/Procedures: Patent examiner for 14 days)   Follow-Up: At [facility], you and your health needs are our priority.  As part of our continuing mission to provide you with exceptional heart care, we have created designated Provider Care Teams.  These Care Teams include your primary Cardiologist (physician) and Advanced Practice Providers (APPs -  Physician Assistants and Nurse Practitioners) who all work together to provide you with the care you need, when you need it.  You will need a follow up appointment in 12 months  Providers on your designated Care Team:   Murray Hodgkins, NP Christell Faith, PA-C Cadence Kathlen Mody, [place]  COVID-19 Vaccine Information can be found at: [email or web address] For questions related to vaccine distribution or appointments, please email [email or web address] or call [phone].   Heart monitor (Zio patch) for 2 weeks (14 days)   Your physician has recommended that you wear a Zio monitor. This monitor is a medical device that records the hearts electrical activity. Doctors most often use these monitors to diagnose arrhythmias. Arrhythmias are problems with the speed or rhythm of the heartbeat. The monitor is a small device applied to your chest. You can wear one while you do your normal daily activities. While wearing this monitor if you have any symptoms to push the button and record what you felt. Once you have worn this monitor for the period of time provider prescribed (Usually 14 days), you will return the monitor device in the postage paid box. Once it is returned they will download the data collected and provide [place] with a report which the provider will then review and we will call you with those results. Important  tips:  Avoid showering during the first 24 hours of wearing the monitor. Avoid excessive sweating to help maximize wear time. Do not submerge the device, no hot tubs, and no swimming pools. Keep any lotions or oils away from the patch. After 24 hours you may shower with the patch on. Take brief showers with your back facing the shower head.  Do not remove patch once it has been placed because that will interrupt data and decrease adhesive wear time. Push the button when you have any symptoms and write down what you were feeling. Once you have completed wearing your monitor, remove and place into box which has postage paid and place in your outgoing mailbox.  If for some reason you have misplaced your box then call our office and we can provide another box and/or mail it off for you.

## 2021-04-12 ENCOUNTER — Telehealth: Payer: Self-pay

## 2021-04-12 MED ORDER — POTASSIUM CHLORIDE CRYS ER 10 MEQ PO TBCR: 10.0000 meq | EXTENDED_RELEASE_TABLET | Freq: Every day | ORAL | 3 refills | Status: DC | PRN

## 2021-04-12 MED ORDER — FUROSEMIDE 20 MG PO TABS: 20.0000 mg | ORAL_TABLET | Freq: Every day | ORAL | 3 refills | Status: DC | PRN

## 2021-04-12 NOTE — Telephone Encounter (Signed)
Reach out to pt following-up from her OV yesterday with Dr. Rockey Situ, Rockey Situ  a secure message after OV wanting to address her leg swelling and adding on medication, advised pt on Dr. Donivan Scull recombinations  Can we remind her to take Lasix 20 daily as needed take sparingly for leg swelling  Also with potassium 10 to take with Lasix  Thx  TG  Virginia Phillips verbalized understanding, all questions were address and no additional concerns at this time. Virginia Phillips thankful for reaching out to her and advice. Agreeable to plan, will call back for anything further.    Medications sent to [facility]

## 2021-04-16 DIAGNOSIS — I479 Paroxysmal tachycardia, unspecified: Secondary | ICD-10-CM | POA: Diagnosis not present

## 2021-04-18 ENCOUNTER — Other Ambulatory Visit: Payer: Self-pay | Admitting: Nurse Practitioner

## 2021-04-18 DIAGNOSIS — N959 Unspecified menopausal and perimenopausal disorder: Secondary | ICD-10-CM

## 2021-04-20 DIAGNOSIS — H43813 Vitreous degeneration, bilateral: Secondary | ICD-10-CM | POA: Diagnosis not present

## 2021-04-20 DIAGNOSIS — H401123 Primary open-angle glaucoma, left eye, severe stage: Secondary | ICD-10-CM | POA: Diagnosis not present

## 2021-04-20 DIAGNOSIS — H401111 Primary open-angle glaucoma, right eye, mild stage: Secondary | ICD-10-CM | POA: Diagnosis not present

## 2021-04-28 ENCOUNTER — Ambulatory Visit (INDEPENDENT_AMBULATORY_CARE_PROVIDER_SITE_OTHER): Payer: Medicare Other | Admitting: Dermatology

## 2021-04-28 ENCOUNTER — Other Ambulatory Visit: Payer: Self-pay

## 2021-04-28 ENCOUNTER — Encounter: Payer: Self-pay | Admitting: Dermatology

## 2021-04-28 DIAGNOSIS — L820 Inflamed seborrheic keratosis: Secondary | ICD-10-CM

## 2021-04-28 NOTE — Patient Instructions (Addendum)
Wound care: ? ?Wash once daily with soap and water. Apply Neosporin once or twice daily.  ? ? ?If You Need Anything After Your Visit ? ?If you have any questions or concerns for your doctor, please call our main line at [phone] and press option 4 to reach your doctor's medical assistant. If no one answers, please leave a voicemail as directed and we will return your call as soon as possible. Messages left after 4 pm will be answered the following business day.  ? ?You may also send [place] a message via MyChart. We typically respond to MyChart messages within 1-2 business days. ? ?For prescription refills, please ask your pharmacy to contact our office. Our fax number is [phone]. ? ?If you have an urgent issue when the clinic is closed that cannot wait until the next business day, you can page your doctor at the number below.   ? ?Please note that while we do our best to be available for urgent issues outside of office hours, we are not available 24/7.  ? ?If you have an urgent issue and are unable to reach [place], you may choose to seek medical care at your doctor's office, retail clinic, urgent care center, or emergency room. ? ?If you have a medical emergency, please immediately call 911 or go to the emergency department. ? ?Pager Numbers ? ?- Dr. Nehemiah Massed: [phone] ? ?- Dr. Laurence Ferrari: [phone] ? ?- Dr. Nicole Kindred: [phone] ? ?In the event of inclement weather, please call our main line at [phone] for an update on the status of any delays or closures. ? ?Dermatology Medication Tips: ?Please keep the boxes that topical medications come in in order to help keep track of the instructions about where and how to use these. Pharmacies typically print the medication instructions only on the boxes and not directly on the medication tubes.  ? ?If your medication is too expensive, please contact our office at [phone] option 4 or send [place] a message through [place].  ? ?We are unable to tell what your co-pay  for medications will be in advance as this is different depending on your insurance coverage. However, we may be able to find a substitute medication at lower cost or fill out paperwork to get insurance to cover a needed medication.  ? ?If a prior authorization is required to get your medication covered by your insurance company, please allow [place] 1-2 business days to complete this process. ? ?Drug prices often vary depending on where the prescription is filled and some pharmacies may offer cheaper prices. ? ?The website www.goodrx.com contains coupons for medications through different pharmacies. The prices here do not account for what the cost may be with help from insurance (it may be cheaper with your insurance), but the website can give you the price if you did not use any insurance.  ?- You can print the associated coupon and take it with your prescription to the pharmacy.  ?- You may also stop by our office during regular business hours and pick up a GoodRx coupon card.  ?- If you need your prescription sent electronically to a different pharmacy, notify our office through [facility] or by phone at [phone] option 4. ? ? ? ? ?Si Usted Necesita Algo Despu?s de Su Visita ? ?Tambi?n puede enviarnos un mensaje a trav?s de MyChart. Por lo general respondemos a los mensajes de MyChart en el transcurso de 1 a 2 d?as h?biles. ? ?Para renovar recetas, por favor pida a su  farmacia que se ponga en contacto con nuestra oficina. Nuestro n?mero de fax es el [phone]. ? ?Si tiene un asunto urgente cuando la cl?nica est? cerrada y que no puede esperar hasta el siguiente d?a h?bil, puede llamar/localizar a su doctor(a) al n?mero que aparece a continuaci?n.  ? ?Por favor, tenga en cuenta que aunque hacemos todo lo posible para estar disponibles para asuntos urgentes fuera del horario de oficina, no estamos disponibles las 24 horas del d?a, los 7 d?as de la semana.  ? ?Si tiene un problema urgente y no puede  comunicarse con nosotros, puede optar por buscar atenci?n m?dica  en el consultorio de su doctor(a), en una cl?nica privada, en un centro de atenci?n urgente o en una sala de emergencias. ? ?Si tiene Engineer, maintenance (IT) m?dica, por favor llame inmediatamente al 911 o vaya a la sala de emergencias. ? ?N?meros de b?per ? ?- Dr. Nehemiah Massed: [phone] ? ?- Dra. Moye: [phone] ? ?- Dra. Nicole Kindred: [phone] ? ?En caso de inclemencias del tiempo, por favor llame a nuestra l?nea principal al [phone] para una actualizaci?n sobre el estado de cualquier retraso o cierre. ? ?Consejos para la medicaci?n en dermatolog?a: ?Por favor, guarde las cajas en las que vienen los medicamentos de uso t?pico para ayudarle a seguir las instrucciones sobre d?nde y c?mo usarlos. Las farmacias generalmente imprimen las instrucciones del medicamento s?lo en las cajas y no directamente en los tubos del [place].  ? ?Si su medicamento es muy caro, por favor, p?ngase en contacto con Zigmund Daniel llamando al [phone] y presione la opci?n 4 o env?enos un mensaje a trav?s de MyChart.  ? ?No podemos decirle cu?l ser? su copago por los medicamentos por adelantado ya que esto es diferente dependiendo de la cobertura de su seguro. Sin embargo, es posible que podamos encontrar un medicamento sustituto a Electrical engineer un formulario para que el seguro cubra el medicamento que se considera necesario.  ? ?Si se requiere Ardelia Mems autorizaci?n previa para que su compa??a de seguros [place] su medicamento, por favor perm?tanos de 1 a 2 d?as h?biles para completar este proceso. ? ?Los precios de los medicamentos var?an con frecuencia dependiendo del Environmental consultant de d?nde se surte la receta y alguna farmacias pueden ofrecer precios m?s baratos. ? ?El sitio web www.goodrx.com tiene cupones para medicamentos de Airline pilot. Los precios aqu? no tienen en cuenta lo que podr?a costar con la ayuda del seguro (puede ser m?s barato con su seguro), pero  el sitio web puede darle el precio si no utiliz? ning?n seguro.  ?- Puede imprimir el cup?n correspondiente y llevarlo con su receta a la farmacia.  ?- Tambi?n puede pasar por nuestra oficina durante el horario de atenci?n regular y recoger una tarjeta de cupones de GoodRx.  ?- Si necesita que su receta se env?e electr?nicamente a Chiropodist, informe a nuestra oficina a trav?s de MyChart de [place] o por tel?fono llamando al [phone] y presione la opci?n 4.  ?

## 2021-04-28 NOTE — Progress Notes (Signed)
? ?  Follow-Up Visit ?  ?Subjective  ?Virginia Phillips is a 76 y.o. female who presents for the following: irritated seborrheic keratosis (Left cheek. Was treated with LN2 08/2020. Patient reports area has come back. Itches, pink, scaly). ? ? ?The following portions of the chart were reviewed this encounter and updated as appropriate:  Tobacco  Allergies  Meds  Problems  Med Hx  Surg Hx  Fam Hx   ?  ? ?Review of Systems: No other skin or systemic complaints except as noted in HPI or Assessment and Plan.  ? ?Objective  ?Well appearing patient in no apparent distress; mood and affect are within normal limits. ? ?A focused examination was performed including face. Relevant physical exam findings are noted in the Assessment and Plan. ? ?Left Cheek x1 ?Erythematous keratotic or waxy stuck-on papule or plaque. ? ? ?Assessment & Plan  ?Inflamed seborrheic keratosis ?Left Cheek x1 ? ?Symptomatic ? ?Reviewed risk of scarring, dyspigmentation ? ?Destruction of lesion - Left Cheek x1 ?Complexity: simple   ?Destruction method: electrodesiccation and curettage   ?Destruction method comment:  Electrodesiccation only ?Anesthesia: the lesion was anesthetized in a standard fashion   ?Anesthetic:  1% lidocaine w/ epinephrine 1-100,000 local infiltration ?Hemostasis achieved with:  electrodesiccation ?Outcome: patient tolerated procedure well with no complications   ?Post-procedure details: wound care instructions given   ? ? ?Return if symptoms worsen or fail to improve. ? ?I, Emelia Salisbury, CMA, am acting as scribe for Forest Gleason, MD. ? ?Documentation: I have reviewed the above documentation for accuracy and completeness, and I agree with the above. ? ?Forest Gleason, MD ? ?

## 2021-05-01 ENCOUNTER — Encounter: Payer: Self-pay | Admitting: Dermatology

## 2021-05-10 DIAGNOSIS — I479 Paroxysmal tachycardia, unspecified: Secondary | ICD-10-CM | POA: Diagnosis not present

## 2021-06-02 ENCOUNTER — Ambulatory Visit: Payer: Medicare Other | Admitting: Nurse Practitioner

## 2021-06-15 DIAGNOSIS — M3501 Sicca syndrome with keratoconjunctivitis: Secondary | ICD-10-CM | POA: Diagnosis not present

## 2021-06-24 ENCOUNTER — Other Ambulatory Visit: Payer: Self-pay | Admitting: Cardiovascular Disease

## 2021-06-24 DIAGNOSIS — I10 Essential (primary) hypertension: Secondary | ICD-10-CM

## 2021-08-02 ENCOUNTER — Telehealth: Payer: Self-pay

## 2021-08-02 NOTE — Telephone Encounter (Signed)
Left vm and sent mychart message to confirm 08/08/21 appointment-Toni

## 2021-08-08 ENCOUNTER — Ambulatory Visit (INDEPENDENT_AMBULATORY_CARE_PROVIDER_SITE_OTHER): Payer: Medicare Other | Admitting: Nurse Practitioner

## 2021-08-08 ENCOUNTER — Encounter: Payer: Self-pay | Admitting: Nurse Practitioner

## 2021-08-08 VITALS — BP 110/76 | HR 94 | Temp 98.0°F | Resp 16 | Ht 67.0 in | Wt 187.0 lb

## 2021-08-08 DIAGNOSIS — E559 Vitamin D deficiency, unspecified: Secondary | ICD-10-CM | POA: Diagnosis not present

## 2021-08-08 DIAGNOSIS — E782 Mixed hyperlipidemia: Secondary | ICD-10-CM

## 2021-08-08 DIAGNOSIS — I10 Essential (primary) hypertension: Secondary | ICD-10-CM | POA: Diagnosis not present

## 2021-08-08 DIAGNOSIS — Z0001 Encounter for general adult medical examination with abnormal findings: Secondary | ICD-10-CM | POA: Diagnosis not present

## 2021-08-08 DIAGNOSIS — J3089 Other allergic rhinitis: Secondary | ICD-10-CM

## 2021-08-08 DIAGNOSIS — M2041 Other hammer toe(s) (acquired), right foot: Secondary | ICD-10-CM

## 2021-08-08 DIAGNOSIS — N959 Unspecified menopausal and perimenopausal disorder: Secondary | ICD-10-CM | POA: Diagnosis not present

## 2021-08-08 DIAGNOSIS — M216X1 Other acquired deformities of right foot: Secondary | ICD-10-CM

## 2021-08-08 DIAGNOSIS — R300 Dysuria: Secondary | ICD-10-CM | POA: Diagnosis not present

## 2021-08-08 DIAGNOSIS — R7303 Prediabetes: Secondary | ICD-10-CM

## 2021-08-08 DIAGNOSIS — E538 Deficiency of other specified B group vitamins: Secondary | ICD-10-CM | POA: Diagnosis not present

## 2021-08-08 DIAGNOSIS — Z1231 Encounter for screening mammogram for malignant neoplasm of breast: Secondary | ICD-10-CM

## 2021-08-08 DIAGNOSIS — M216X2 Other acquired deformities of left foot: Secondary | ICD-10-CM

## 2021-08-08 MED ORDER — VENLAFAXINE HCL ER 75 MG PO CP24: 75.0000 mg | ORAL_CAPSULE | Freq: Every day | ORAL | 3 refills | Status: DC

## 2021-08-08 NOTE — Progress Notes (Signed)
[facility] [place], [place] [address]  Internal MEDICINE  Office Visit Note  Patient Name: Virginia Phillips  [id number]  [id number]  Date of Service: 08/08/2021  Chief Complaint  Patient presents with   Medicare Wellness   Gastroesophageal Reflux   Hypertension   Hyperlipidemia    HPI Virginia Phillips presents for an annual well visit and physical exam.  She is a well-appearing 76 year old female with hypertension, Karlene Lineman, allergic rhinitis, history of stroke, migraines, chronic back pain, and hyperlipidemia. She lives with her husband and is his caregiver. She reports that she has family in [place] and they are a good support system for her and she sees her 3 grandchildren regularly.   She reports that her anxiety and depression are stable and controlled. She reports that her family support helps to keep her spirits up.  She has increased her focus on self-care, decreasing her stress level and not lingering on thoughts about things she cannot control. She is due for routine mammogram later this year.  she will be due for a repeat routine colonoscopy in 2024. She is due for routine labs and has no other upcoming preventive screenings.  In need of venlafaxine refills.      Current Medication: Outpatient Encounter Medications as of 08/08/2021  Medication Sig   acetaminophen (TYLENOL) 650 MG CR tablet Take 650 mg by mouth every 8 (eight) hours as needed for pain.   albuterol (VENTOLIN HFA) 108 (90 Base) MCG/ACT inhaler INHALE 2 PUFFS INTO THE LUNGS EVERY 6 HOURS AS NEEDED FOR WHEEZING OR SHORTNESS OF BREATH   amLODipine (NORVASC) 5 MG tablet TAKE 1 TABLET(5 MG) BY MOUTH DAILY   ASPIRIN 81 PO Take 81 mg by mouth daily.   aspirin-acetaminophen-caffeine (EXCEDRIN MIGRAINE) 250-250-65 MG tablet Take 2 tablets by mouth daily as needed for headache or migraine.   aspirin-sod bicarb-citric acid (ALKA-SELTZER) 325 MG TBEF tablet Take 650 mg by mouth every 6 (six) hours as needed  (indigestion).   Biotin 5 MG CAPS Take 5,000 mg by mouth daily at 2 PM.   BLACK CURRANT SEED OIL PO Take 1 capsule by mouth daily at 2 PM.   carboxymethylcellulose 1 % ophthalmic solution 1 drop 3 (three) times daily.   Cholecalciferol (VITAMIN D) 2000 UNITS CAPS Take 2,000 Units by mouth daily at 2 PM.    Coenzyme Q10 (CO Q10) 200 MG CAPS Take 200 mg by mouth daily at 2 PM.    furosemide (LASIX) 20 MG tablet Take 1 tablet (20 mg total) by mouth daily as needed. For leg swelling or shortness of breath   Garlic 2956 MG CAPS Take 6,000 mg by mouth daily as needed.   HYDROcodone bit-homatropine (HYDROMET) 5-1.5 MG/5ML syrup Take 5 mLs by mouth every 4 (four) hours as needed for cough.   ibuprofen (ADVIL,MOTRIN) 200 MG tablet Take 400 mg by mouth daily as needed for headache or moderate pain.    Lancets (ONETOUCH ULTRASOFT) lancets Check sugar once daily DX 73.9 needs for one touch ultra 2 lancets   Magnesium 100 MG TABS Take by mouth daily.   Magnesium 400 MG TABS Take by mouth daily.   meclizine (ANTIVERT) 12.5 MG tablet One tab po tid prn for dizziness   methocarbamol (ROBAXIN) 500 MG tablet Take 1 tablet (500 mg total) by mouth 3 (three) times daily.   Misc Natural Products (PETADOLEX 50) 50 MG CAPS Take 100 mg by mouth daily.   multivitamin-lutein (OCUVITE-LUTEIN) CAPS capsule Take 1 capsule by  mouth daily.   Omega-3 Fatty Acids (OMEGA-3 FISH OIL) 1200 MG CAPS Take by mouth.   omeprazole (PRILOSEC) 20 MG capsule Take 20 mg by mouth daily as needed.   ONE TOUCH ULTRA TEST test strip CHECK BLOOD SUGAR ONCE DAILY AS DIRECTED   pilocarpine (SALAGEN) 5 MG tablet Take 5 mg by mouth 2 (two) times daily.   polyethylene glycol (MIRALAX / GLYCOLAX) packet Take 17 g by mouth daily.    Polyvinyl Alcohol-Povidone (REFRESH OP) Place 1 drop into both eyes daily as needed (dry eyes).   potassium chloride (KLOR-CON M) 10 MEQ tablet Take 1 tablet (10 mEq total) by mouth daily as needed. Take with lasix    pyridOXINE (VITAMIN B-6) 100 MG tablet Take 100 mg by mouth daily at 2 PM.   Riboflavin (B2) 100 MG TABS Take 100 mg by mouth 2 (two) times daily.   simvastatin (ZOCOR) 5 MG tablet Take 1 tablet (5 mg total) by mouth daily.   SUPER B COMPLEX/C PO Take 1 tablet by mouth daily at 2 PM.   Thiamine HCl (VITAMIN B-1) 250 MG tablet Take 250 mg by mouth daily at 2 PM.   valsartan (DIOVAN) 80 MG tablet Take 80 mg by mouth daily at 2 PM.    vitamin B-12 (CYANOCOBALAMIN) 1000 MCG tablet Take 1,000 mcg by mouth daily at 2 PM.   vitamin C (ASCORBIC ACID) 500 MG tablet Take 500 mg by mouth daily at 2 PM.   [DISCONTINUED] azithromycin (ZITHROMAX) 250 MG tablet Take one tab a day for 10 days for uri   [DISCONTINUED] venlafaxine XR (EFFEXOR-XR) 75 MG 24 hr capsule TAKE 1 CAPSULE(75 MG) BY MOUTH DAILY AT 2 PM   venlafaxine XR (EFFEXOR-XR) 75 MG 24 hr capsule Take 1 capsule (75 mg total) by mouth daily with breakfast.   No facility-administered encounter medications on file as of 08/08/2021.    Surgical History: Past Surgical History:  Procedure Laterality Date   ANKLE FRACTURE SURGERY Right 2007   COLONOSCOPY WITH PROPOFOL N/A 05/10/2017   Procedure: COLONOSCOPY WITH PROPOFOL;  Surgeon: [name], MD;  Location: [facility] ENDOSCOPY;  Service: Endoscopy;  Laterality: N/A;   ESOPHAGOGASTRODUODENOSCOPY (EGD) WITH PROPOFOL N/A 10/17/2017   Procedure: ESOPHAGOGASTRODUODENOSCOPY (EGD) WITH PROPOFOL;  Surgeon: [name], MD;  Location: [facility] ENDOSCOPY;  Service: Endoscopy;  Laterality: N/A;   GANGLION CYST EXCISION Right    wrist   HERNIA REPAIR     2011   KNEE ARTHROSCOPY W/ ACL RECONSTRUCTION Right 2008   KNEE SURGERY  08/05/2007   LASIK     NISSEN FUNDOPLICATION  [id number]    Medical History: Past Medical History:  Diagnosis Date   Coronary artery disease    GERD (gastroesophageal reflux disease)    Glaucoma    Headache    migraines   History of shingles    Hyperlipidemia    Hypertension     Pre-diabetes    Pulmonary embolism on right [facility]) 2007   right lung   Sjogren's syndrome ([place])    Stroke ([place]) 1998    Family History: Family History  Problem Relation Age of Onset   Congestive Heart Failure Mother    Parkinson's disease Mother    Diabetes Father    Scleroderma Sister    Cancer Brother     Social History   Socioeconomic History   Marital status: Married    Spouse name: Not on file   Number of children: 1   Years of education: Not on  file   Highest education level: Some college, no degree  Occupational History   Occupation: retired  Tobacco Use   Smoking status: Never   Smokeless tobacco: Never  Vaping Use   Vaping Use: Never used  Substance and Sexual Activity   Alcohol use: No    Alcohol/week: 0.0 standard drinks of alcohol   Drug use: No   Sexual activity: Not on file  Other Topics Concern   Not on file  Social History Narrative   Not on file   Social Determinants of Health   Financial Resource Strain: Low Risk  (06/14/2017)   Overall Financial Resource Strain (CARDIA)    Difficulty of Paying Living Expenses: Not hard at all  Food Insecurity: No Food Insecurity (06/14/2017)   Hunger Vital Sign    Worried About Running Out of Food in the Last Year: Never true    [place] in the Last Year: Never true  Transportation Needs: No Transportation Needs (06/14/2017)   PRAPARE - Hydrologist (Medical): No    Lack of Transportation (Non-Medical): No  Physical Activity: Not on file  Stress: No Stress Concern Present (06/14/2017)   [place]    Feeling of Stress : Only a little  Social Connections: Not on file  Intimate Partner Violence: Not on file      Review of Systems  Constitutional:  Negative for activity change, appetite change, chills, fatigue, fever and unexpected weight change.  HENT: Negative.  Negative for congestion, ear pain,  rhinorrhea, sore throat and trouble swallowing.   Eyes: Negative.   Respiratory: Negative.  Negative for cough, chest tightness, shortness of breath and wheezing.   Cardiovascular: Negative.  Negative for chest pain.  Gastrointestinal: Negative.  Negative for abdominal pain, blood in stool, constipation, diarrhea, nausea and vomiting.  Endocrine: Negative.   Genitourinary: Negative.  Negative for difficulty urinating, dysuria, frequency, hematuria and urgency.  Musculoskeletal: Negative.  Negative for arthralgias, back pain, joint swelling, myalgias and neck pain.  Skin: Negative.  Negative for rash and wound.  Allergic/Immunologic: Negative.  Negative for immunocompromised state.  Neurological: Negative.  Negative for dizziness, seizures, numbness and headaches.  Hematological: Negative.   Psychiatric/Behavioral: Negative.  Negative for behavioral problems, self-injury and suicidal ideas. The patient is not nervous/anxious.     Vital Signs: BP 110/76   Pulse 94   Temp 98 F (36.7 C)   Resp 16   Ht _0  (1.702 m)   Wt 187 lb (84.8 kg)   SpO2 95%   BMI 29.29 kg/m    Physical Exam Vitals reviewed.  Constitutional:      General: She is awake. She is not in acute distress.    Appearance: Normal appearance. She is well-developed and well-groomed. She is not ill-appearing or diaphoretic.  HENT:     Head: Normocephalic and atraumatic.     Right Ear: Tympanic membrane, ear canal and external ear normal.     Left Ear: Tympanic membrane, ear canal and external ear normal.     Nose: Nose normal. No congestion or rhinorrhea.     Mouth/Throat:     Lips: Pink.     Mouth: Mucous membranes are moist.     Pharynx: Oropharynx is clear. Uvula midline. No oropharyngeal exudate or posterior oropharyngeal erythema.  Eyes:     General: Lids are normal. Vision grossly intact. Gaze aligned appropriately. No scleral icterus.  Right eye: No discharge.        Left eye: No discharge.      Extraocular Movements: Extraocular movements intact.     Conjunctiva/sclera: Conjunctivae normal.     Pupils: Pupils are equal, round, and reactive to light.     Funduscopic exam:    Right eye: Red reflex present.        Left eye: Red reflex present. Neck:     Thyroid: No thyromegaly.     Vascular: No JVD.     Trachea: Trachea and phonation normal. No tracheal deviation.  Cardiovascular:     Rate and Rhythm: Normal rate and regular rhythm.     Heart sounds: Normal heart sounds, S1 normal and S2 normal. No murmur heard.    No friction rub. No gallop.  Pulmonary:     Effort: Pulmonary effort is normal. No accessory muscle usage or respiratory distress.     Breath sounds: Normal breath sounds and air entry. No stridor. No wheezing or rales.  Chest:     Chest wall: No swelling or tenderness.  Breasts:    Right: Normal. No swelling, bleeding, inverted nipple, mass, nipple discharge, skin change or tenderness.     Left: Normal. No swelling, bleeding, inverted nipple, mass, nipple discharge, skin change or tenderness.  Abdominal:     General: Bowel sounds are normal. There is no distension.     Palpations: Abdomen is soft. There is no mass.     Tenderness: There is no abdominal tenderness. There is no guarding or rebound.  Musculoskeletal:        General: No tenderness or deformity. Normal range of motion.     Cervical back: Normal range of motion and neck supple.     Right lower leg: 1+ Pitting Edema present.     Left lower leg: 1+ Pitting Edema present.  Lymphadenopathy:     Cervical: No cervical adenopathy.     Upper Body:     Right upper body: No supraclavicular, axillary or pectoral adenopathy.     Left upper body: No supraclavicular, axillary or pectoral adenopathy.  Skin:    General: Skin is warm and dry.     Capillary Refill: Capillary refill takes less than 2 seconds.     Coloration: Skin is not pale.     Findings: No erythema or rash.  Neurological:     Mental Status: She  is alert and oriented to person, place, and time.     Cranial Nerves: No cranial nerve deficit.     Motor: No abnormal muscle tone.     Coordination: Coordination normal.     Gait: Gait normal.     Deep Tendon Reflexes: Reflexes are normal and symmetric.  Psychiatric:        Mood and Affect: Mood and affect normal.        Behavior: Behavior normal. Behavior is cooperative.        Thought Content: Thought content normal.        Judgment: Judgment normal.        Assessment/Plan: 1. Encounter for general adult medical examination with abnormal findings Age-appropriate preventive screenings and vaccinations discussed, annual physical exam completed. Routine labs for health maintenance ordered, see below. PHM updated.  - CBC with Differential/Platelet - CMP14+EGFR - TSH + free T4  2. Essential hypertension BP is stable and well controlled.  - CBC with Differential/Platelet - CMP14+EGFR - TSH + free T4  3. Prediabetes Stable, labs ordered.  - CBC with Differential/Platelet -  CMP14+EGFR - TSH + free T4 - Hgb A1C w/o eAG  4. B12 deficiency Routine labs ordered.  - B12 and Folate Panel  5. Vitamin D deficiency Routine labs ordered.  - CBC with Differential/Platelet - CMP14+EGFR - TSH + free T4 - Vitamin D (25 hydroxy)  6. Menopausal and postmenopausal disorder Venlafaxine refills ordered., routine labs ordered.  - CBC with Differential/Platelet - CMP14+EGFR - TSH + free T4 - venlafaxine XR (EFFEXOR-XR) 75 MG 24 hr capsule; Take 1 capsule (75 mg total) by mouth daily with breakfast.  Dispense: 90 capsule; Refill: 3  7. Mixed hyperlipidemia Routine labs ordered.  - CBC with Differential/Platelet - CMP14+EGFR - Lipid Profile - TSH + free T4  8. Non-seasonal allergic rhinitis due to other allergic trigger Blood allergy testing ordered as requested by the patient.  - Allergens w/Total IgE Area 2  9. Dysuria Routine urinalysis done  - UA/M w/rflx Culture,  Routine  10. Encounter for screening mammogram for malignant neoplasm of breast - MM 3D SCREEN BREAST BILATERAL; Future  11. Equinus deformity of both feet Managed by podiatry  12. Hammer toe of right foot Managed by podiatry.      General Counseling: Marthena verbalizes understanding of the findings of todays visit and agrees with plan of treatment. I have discussed any further diagnostic evaluation that may be needed or ordered today. We also reviewed her medications today. she has been encouraged to call the office with any questions or concerns that should arise related to todays visit.    Orders Placed This Encounter  Procedures   MM 3D SCREEN BREAST BILATERAL   UA/M w/rflx Culture, Routine   CBC with Differential/Platelet   CMP14+EGFR   Lipid Profile   TSH + free T4   Vitamin D (25 hydroxy)   B12 and Folate Panel   Hgb A1C w/o eAG   Allergens w/Total IgE Area 2    Meds ordered this encounter  Medications   venlafaxine XR (EFFEXOR-XR) 75 MG 24 hr capsule    Sig: Take 1 capsule (75 mg total) by mouth daily with breakfast.    Dispense:  90 capsule    Refill:  3    Return in about 1 year (around 08/09/2022) for CPE, Myking Sar PCP.   Total time spent:30 Minutes Time spent includes review of chart, medications, test results, and follow up plan with the patient.   [place] Controlled Substance Database was reviewed by me.  This patient was seen by Jonetta Osgood, FNP-C in collaboration with Dr. Clayborn Bigness as a part of collaborative care agreement.  Adonay Scheier R. Valetta Fuller, MSN, FNP-C Internal medicine

## 2021-08-09 DIAGNOSIS — E782 Mixed hyperlipidemia: Secondary | ICD-10-CM | POA: Diagnosis not present

## 2021-08-09 DIAGNOSIS — I10 Essential (primary) hypertension: Secondary | ICD-10-CM | POA: Diagnosis not present

## 2021-08-09 DIAGNOSIS — N959 Unspecified menopausal and perimenopausal disorder: Secondary | ICD-10-CM | POA: Diagnosis not present

## 2021-08-09 DIAGNOSIS — R7303 Prediabetes: Secondary | ICD-10-CM | POA: Diagnosis not present

## 2021-08-09 DIAGNOSIS — J3089 Other allergic rhinitis: Secondary | ICD-10-CM | POA: Diagnosis not present

## 2021-08-09 DIAGNOSIS — E538 Deficiency of other specified B group vitamins: Secondary | ICD-10-CM | POA: Diagnosis not present

## 2021-08-09 DIAGNOSIS — R300 Dysuria: Secondary | ICD-10-CM | POA: Diagnosis not present

## 2021-08-09 DIAGNOSIS — Z0001 Encounter for general adult medical examination with abnormal findings: Secondary | ICD-10-CM | POA: Diagnosis not present

## 2021-08-09 DIAGNOSIS — E559 Vitamin D deficiency, unspecified: Secondary | ICD-10-CM | POA: Diagnosis not present

## 2021-08-09 LAB — MICROSCOPIC EXAMINATION
Bacteria, UA: NONE SEEN
Casts: NONE SEEN /lpf

## 2021-08-09 LAB — UA/M W/RFLX CULTURE, ROUTINE
Bilirubin, UA: NEGATIVE
Ketones, UA: NEGATIVE
Leukocytes,UA: NEGATIVE
Nitrite, UA: NEGATIVE
Protein,UA: NEGATIVE
RBC, UA: NEGATIVE
Specific Gravity, UA: 1.017 (ref 1.005–1.030)
Urobilinogen, Ur: 0.2 mg/dL (ref 0.2–1.0)
pH, UA: 5.5 (ref 5.0–7.5)

## 2021-08-10 ENCOUNTER — Other Ambulatory Visit: Payer: Self-pay | Admitting: Cardiovascular Disease

## 2021-08-10 ENCOUNTER — Telehealth: Payer: Self-pay

## 2021-08-10 NOTE — Progress Notes (Signed)
Please call patient let her know her results Metabolic panel grossly normal, elevated glucose which is consistent with A1c of 6.0 which is prediabetic and no significant change. Kidney and liver function are normal CBC is normal Thyroid levels are normal B12 and folate are normal Vitamin D is normal Cholesterol levels are abnormal.  Total cholesterol slightly improved, triglyceride level still elevated but significantly improved.  HDL and VLDL are within normal limits.  LDL is slightly elevated at 126.  Cholesterol/HDL ratio is 3.5 which is less than average risk of developing atherosclerotic cardiovascular disease.  Ways to improve cholesterol include decreasing red meat intake, increasing lean protein intake including chicken [place] and fish.  Taking an omega-3 fish oil or flaxseed oil supplement is also recommended which she is already doing.  I would also recommend increasing her simvastatin dose to 10 mg daily if she is agreeable.  Allergy lab is still processing

## 2021-08-10 NOTE — Telephone Encounter (Signed)
Spoke to pt, provided results, pt requested a print out of labs. They were put up front for pick up.

## 2021-08-10 NOTE — Telephone Encounter (Signed)
-----   Message from Jonetta Osgood, NP sent at 08/10/2021  8:36 AM EDT ----- Please call patient let her know her results Metabolic panel grossly normal, elevated glucose which is consistent with A1c of 6.0 which is prediabetic and no significant change. Kidney and liver function are normal CBC is normal Thyroid levels are normal B12 and folate are normal Vitamin D is normal Cholesterol levels are abnormal.  Total cholesterol slightly improved, triglyceride level still elevated but significantly improved.  HDL and VLDL are within normal limits.  LDL is slightly elevated at 126.  Cholesterol/HDL ratio is 3.5 which is less than average risk of developing atherosclerotic cardiovascular disease.  Ways to improve cholesterol include decreasing red meat intake, increasing lean protein intake including chicken [place] and fish.  Taking an omega-3 fish oil or flaxseed oil supplement is also recommended which she is already doing.  I would also recommend increasing her simvastatin dose to 10 mg daily if she is agreeable.  Allergy lab is still processing

## 2021-08-12 LAB — ALLERGENS W/TOTAL IGE AREA 2

## 2021-08-12 LAB — LIPID PANEL
Chol/HDL Ratio: 3.5 ratio (ref 0.0–4.4)
Cholesterol, Total: 213 mg/dL — ABNORMAL HIGH (ref 100–199)
HDL: 61 mg/dL (ref >39)
LDL Chol Calc (NIH): 126 mg/dL — ABNORMAL HIGH (ref 0–99)
Triglycerides: 150 mg/dL — ABNORMAL HIGH (ref 0–149)
VLDL Cholesterol Cal: 26 mg/dL (ref 5–40)

## 2021-08-12 LAB — CBC WITH DIFFERENTIAL/PLATELET
Basophils Absolute: 0.1 10*3/uL (ref 0.0–0.2)
Basos: 1 %
EOS (ABSOLUTE): 0.1 10*3/uL (ref 0.0–0.4)
Eos: 2 %
Hematocrit: 46 % (ref 34.0–46.6)
Hemoglobin: 15.7 g/dL (ref 11.1–15.9)
Immature Grans (Abs): 0 10*3/uL (ref 0.0–0.1)
Immature Granulocytes: 0 %
Lymphocytes Absolute: 2 10*3/uL (ref 0.7–3.1)
Lymphs: 35 %
MCH: 31.7 pg (ref 26.6–33.0)
MCHC: 34.1 g/dL (ref 31.5–35.7)
MCV: 93 fL (ref 79–97)
Monocytes Absolute: 0.5 10*3/uL (ref 0.1–0.9)
Monocytes: 8 %
Neutrophils Absolute: 3.1 10*3/uL (ref 1.4–7.0)
Neutrophils: 54 %
Platelets: 266 10*3/uL (ref 150–450)
RBC: 4.96 x10E6/uL (ref 3.77–5.28)
RDW: 11.9 % (ref 11.7–15.4)
WBC: 5.8 10*3/uL (ref 3.4–10.8)

## 2021-08-12 LAB — CMP14+EGFR
ALT: 18 IU/L (ref 0–32)
AST: 19 IU/L (ref 0–40)
Albumin/Globulin Ratio: 2 (ref 1.2–2.2)
Albumin: 4.6 g/dL (ref 3.7–4.7)
Alkaline Phosphatase: 72 IU/L (ref 44–121)
BUN/Creatinine Ratio: 13 (ref 12–28)
BUN: 11 mg/dL (ref 8–27)
Bilirubin Total: 0.4 mg/dL (ref 0.0–1.2)
CO2: 22 mmol/L (ref 20–29)
Calcium: 9.8 mg/dL (ref 8.7–10.3)
Chloride: 103 mmol/L (ref 96–106)
Creatinine, Ser: 0.84 mg/dL (ref 0.57–1.00)
Globulin, Total: 2.3 g/dL (ref 1.5–4.5)
Glucose: 146 mg/dL — ABNORMAL HIGH (ref 70–99)
Potassium: 4.8 mmol/L (ref 3.5–5.2)
Sodium: 140 mmol/L (ref 134–144)
Total Protein: 6.9 g/dL (ref 6.0–8.5)
eGFR: 72 mL/min/{1.73_m2} (ref >59)

## 2021-08-12 LAB — TSH+FREE T4
Free T4: 0.97 ng/dL (ref 0.82–1.77)
TSH: 1.54 u[IU]/mL (ref 0.450–4.500)

## 2021-08-12 LAB — VITAMIN D 25 HYDROXY (VIT D DEFICIENCY, FRACTURES): Vit D, 25-Hydroxy: 42.5 ng/mL (ref 30.0–100.0)

## 2021-08-12 LAB — B12 AND FOLATE PANEL
Folate: 17.9 ng/mL (ref >3.0)
Vitamin B-12: 1649 pg/mL — ABNORMAL HIGH (ref 232–1245)

## 2021-08-12 LAB — HGB A1C W/O EAG: Hgb A1c MFr Bld: 6 % — ABNORMAL HIGH (ref 4.8–5.6)

## 2021-08-29 ENCOUNTER — Telehealth: Payer: Self-pay | Admitting: Cardiovascular Disease

## 2021-08-29 MED ORDER — SIMVASTATIN 5 MG PO TABS: 5.0000 mg | ORAL_TABLET | Freq: Every day | ORAL | 2 refills | Status: DC

## 2021-08-29 NOTE — Telephone Encounter (Signed)
Requested Prescriptions   Signed Prescriptions Disp Refills   simvastatin (ZOCOR) 5 MG tablet 90 tablet 2    Sig: Take 1 tablet (5 mg total) by mouth daily.    Authorizing Provider: Rise Mu    Ordering User: Britt Bottom

## 2021-08-29 NOTE — Telephone Encounter (Signed)
*  STAT* If patient is at the pharmacy, call can be transferred to refill team.   1. Which medications need to be refilled? (please list name of each medication and dose if known) simvastatin (ZOCOR) 5 MG tablet  2. Which pharmacy/location (including street and city if local pharmacy) is medication to be sent to?[facility] [place], [place]  3. Do they need a 30 day or 90 day supply? [place]

## 2021-09-14 DIAGNOSIS — M3501 Sicca syndrome with keratoconjunctivitis: Secondary | ICD-10-CM | POA: Diagnosis not present

## 2021-09-14 DIAGNOSIS — H04123 Dry eye syndrome of bilateral lacrimal glands: Secondary | ICD-10-CM | POA: Diagnosis not present

## 2021-09-20 IMAGING — DX DG SHOULDER 2+V*L*
3 series · 3 of 3 positions shown · non-contrast
Comparison: None.

CLINICAL DATA: Pain post fall

EXAM:
LEFT SHOULDER - 2+ VIEW

[shoulder ap (1 of 2)]
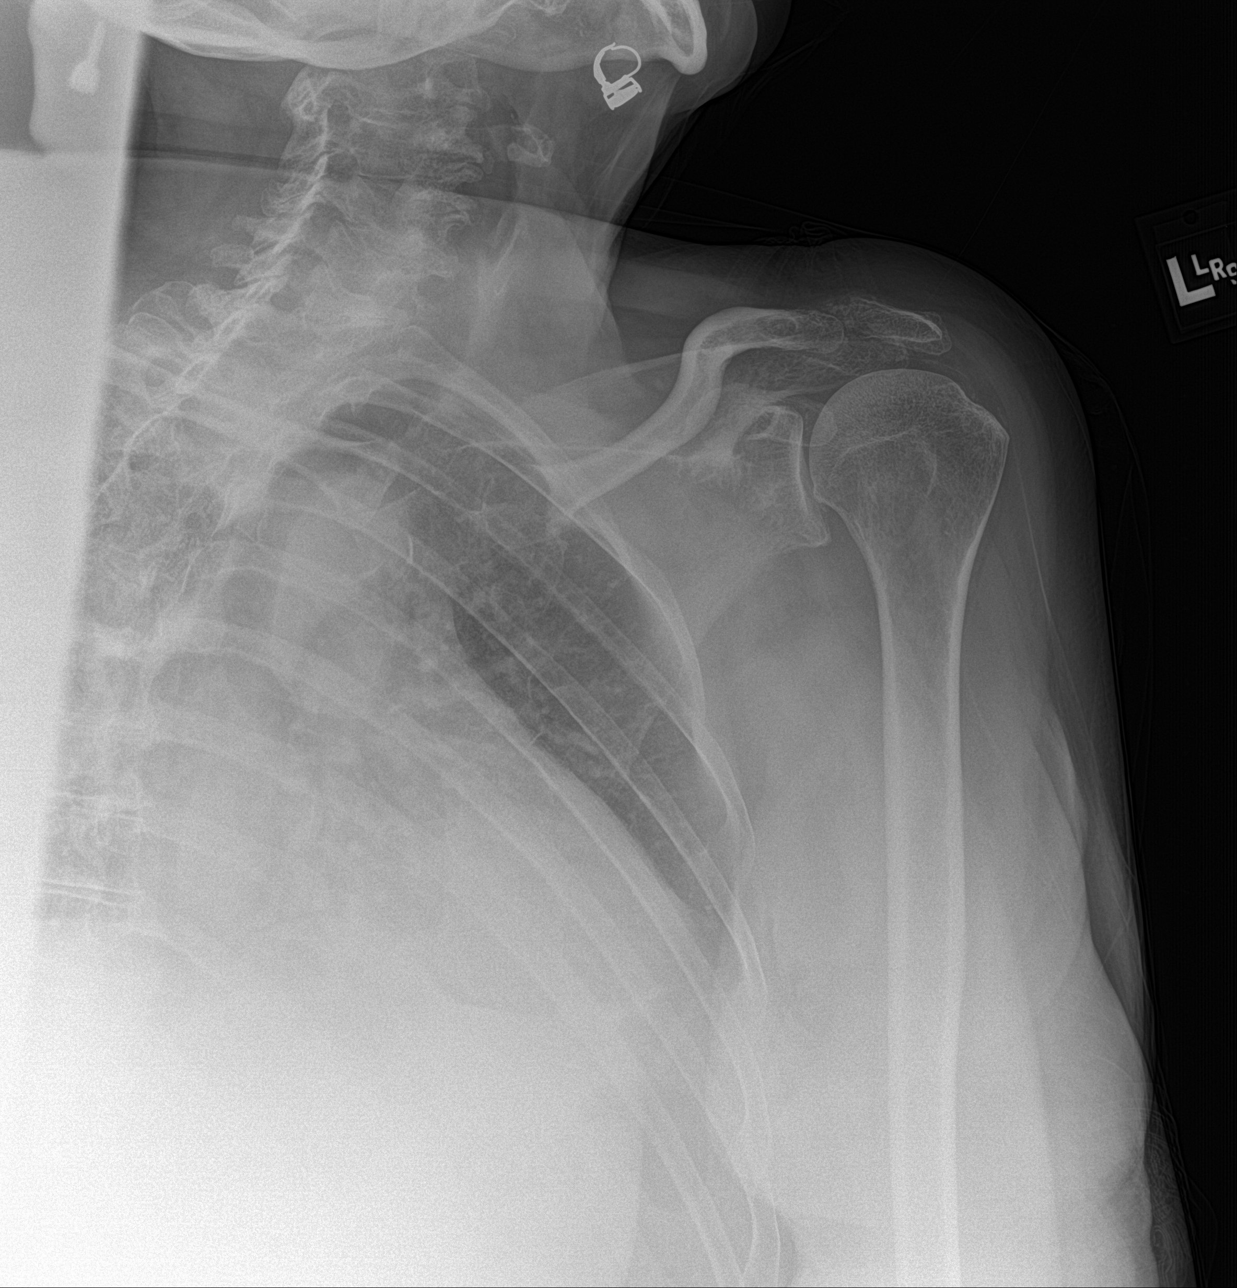

[shoulder ap (2 of 2)]
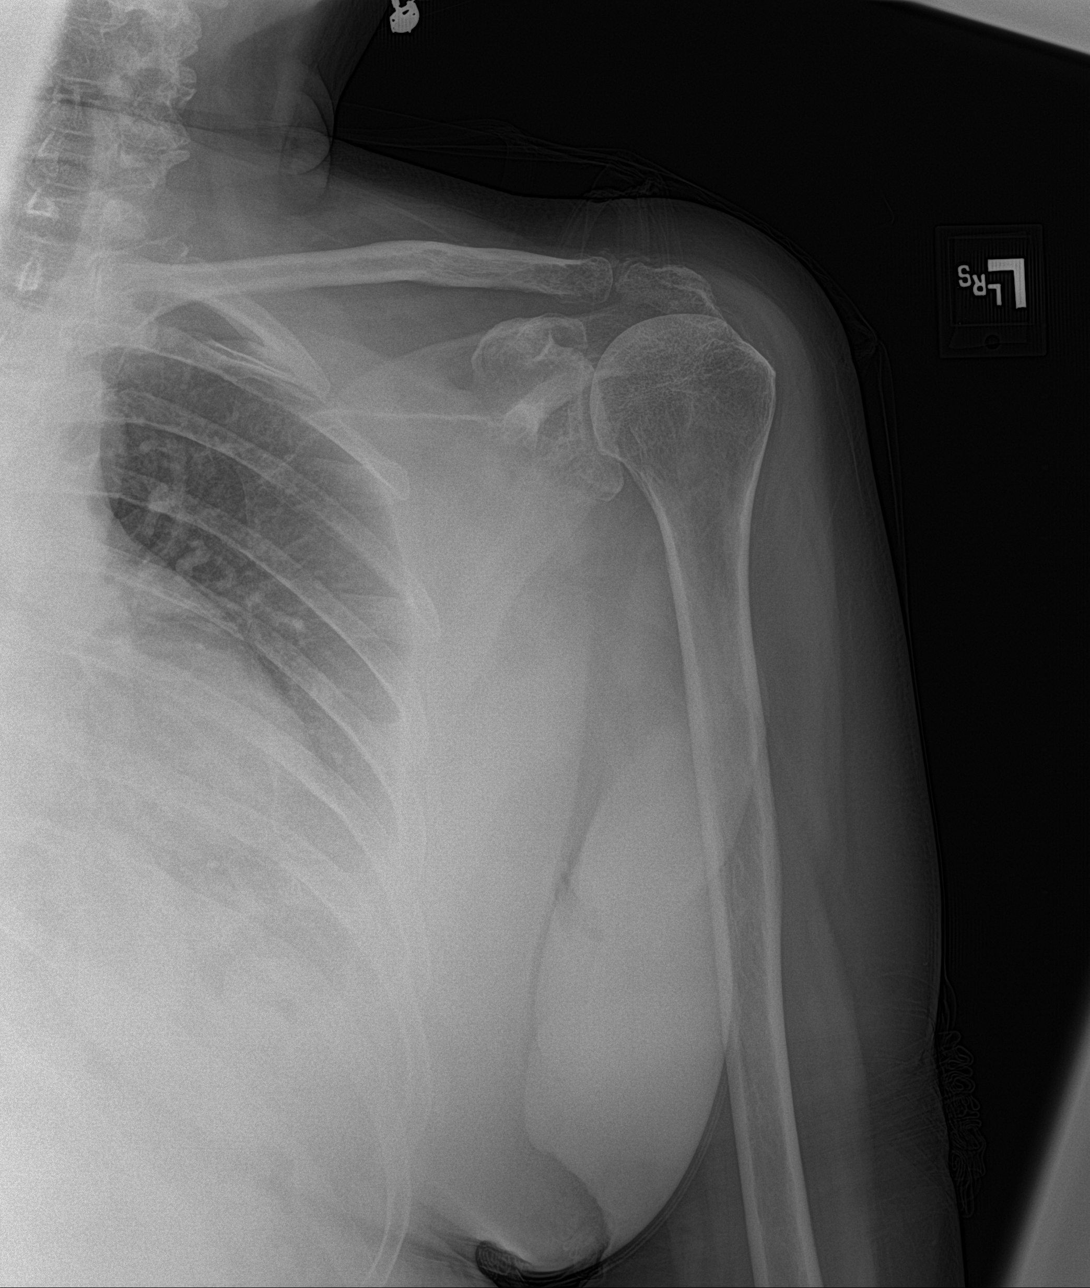

[shoulder obl]
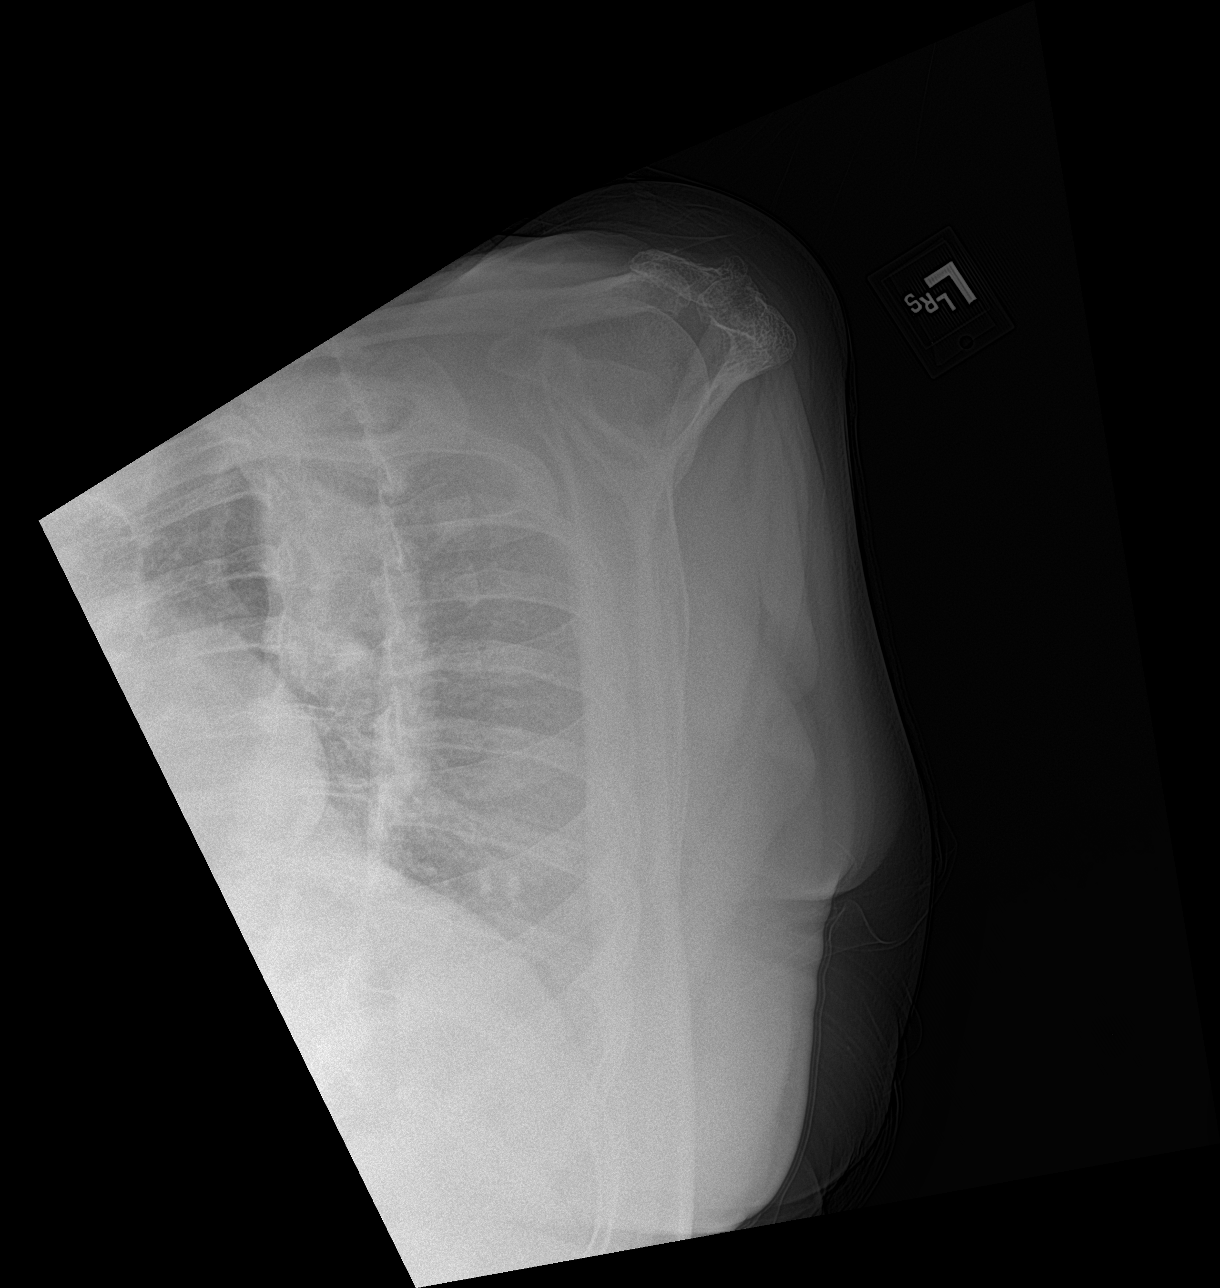

[3 of 3 positions shown; findings below may reference images not displayed]

FINDINGS: No acute displaced fracture or malalignment. Moderate AC joint and
glenohumeral degenerative change. Mild calcific tendinitis. Slightly
high-riding appearance of humeral head.
IMPRESSION: 1. No acute fracture or dislocation
2. Degenerative changes of the AC joint and shoulder. Slightly
high-riding appearance of the humeral head as may be seen with
rotator cuff disease.

## 2021-09-20 IMAGING — DX DG TIBIA/FIBULA 2V*R*
4 series · 4 of 4 positions shown · non-contrast
Comparison: 01/03/2006

CLINICAL DATA: Pain post fall

EXAM:
RIGHT TIBIA AND FIBULA - 2 VIEW

[tibia ap (1 of 2)]
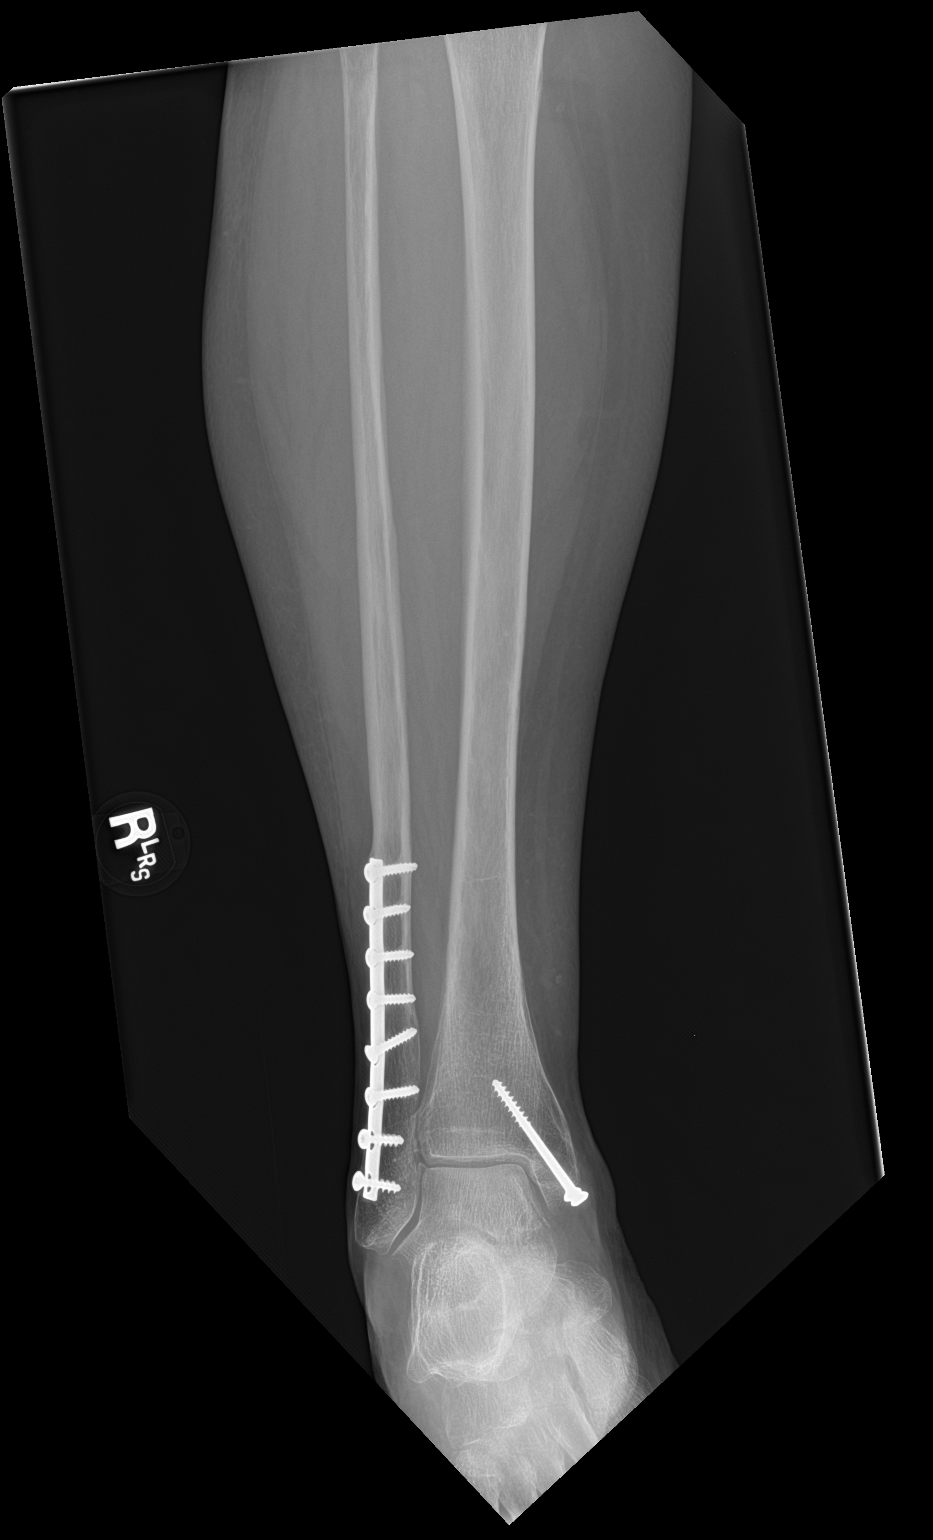

[tibia ap (2 of 2)]
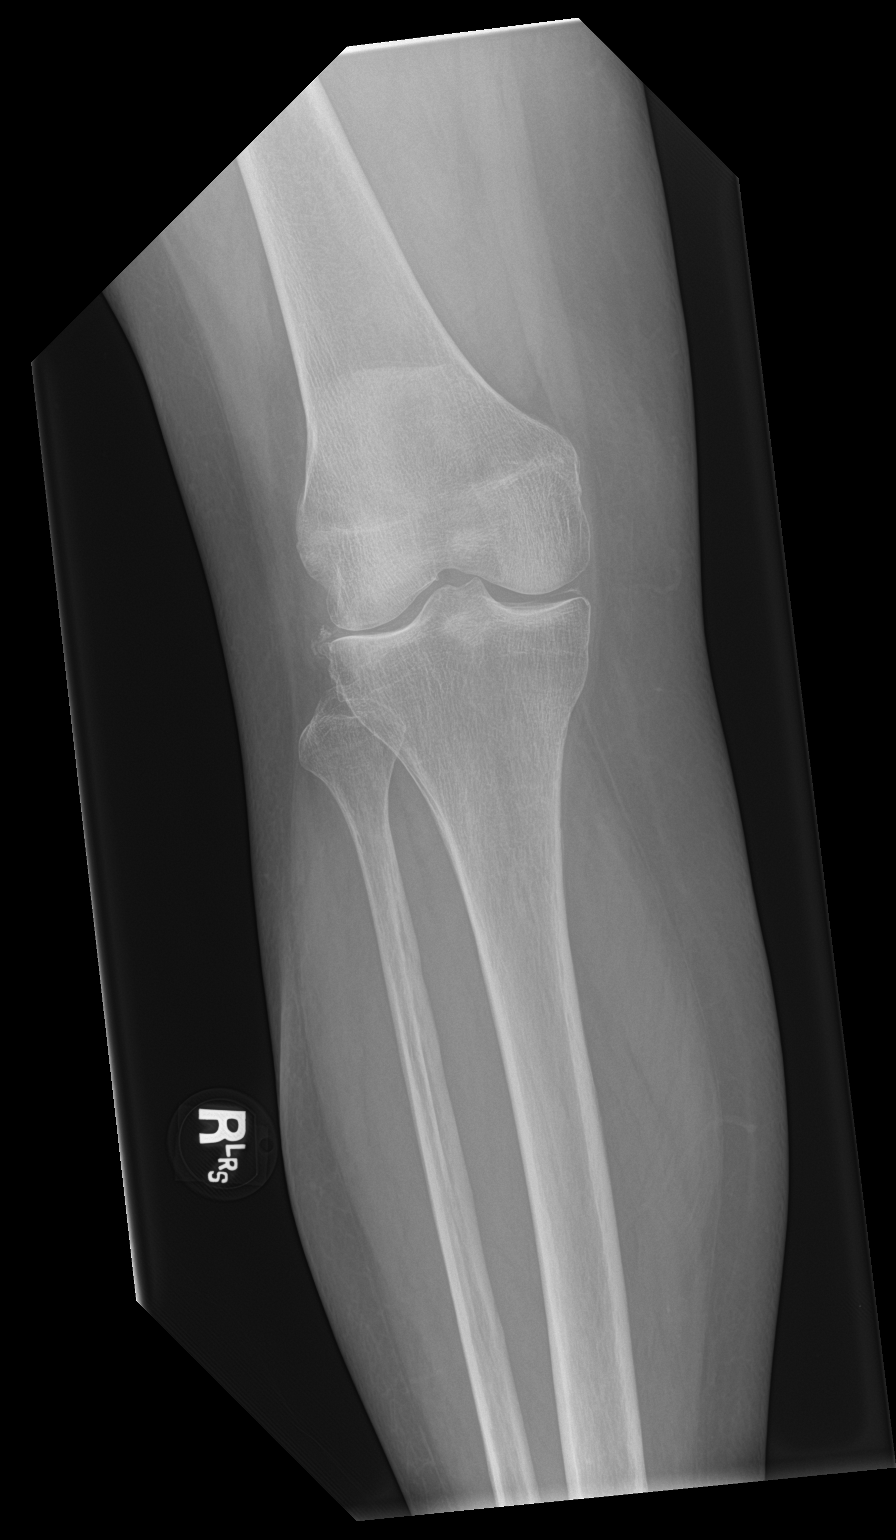

[tibia lat (1 of 2)]
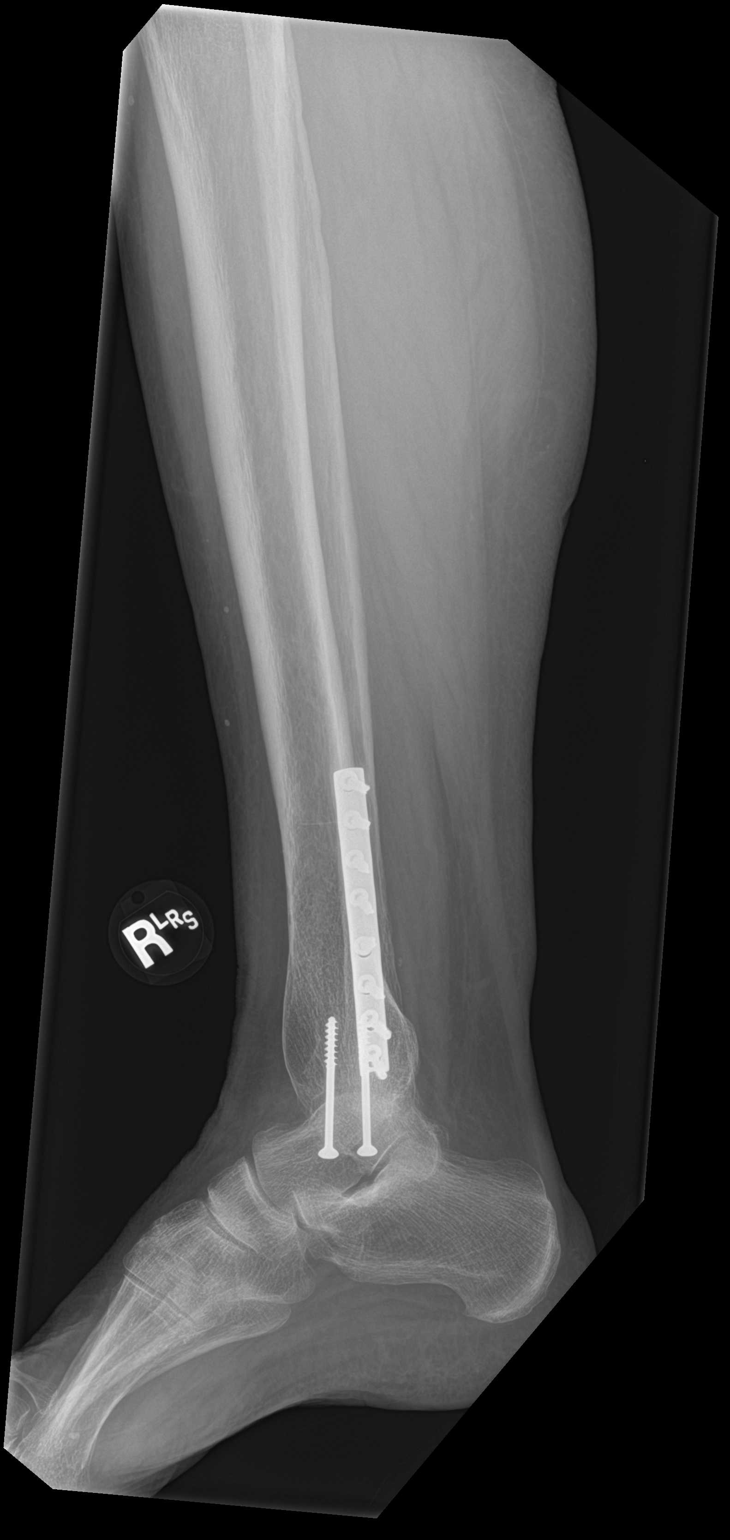

[tibia lat (2 of 2)]
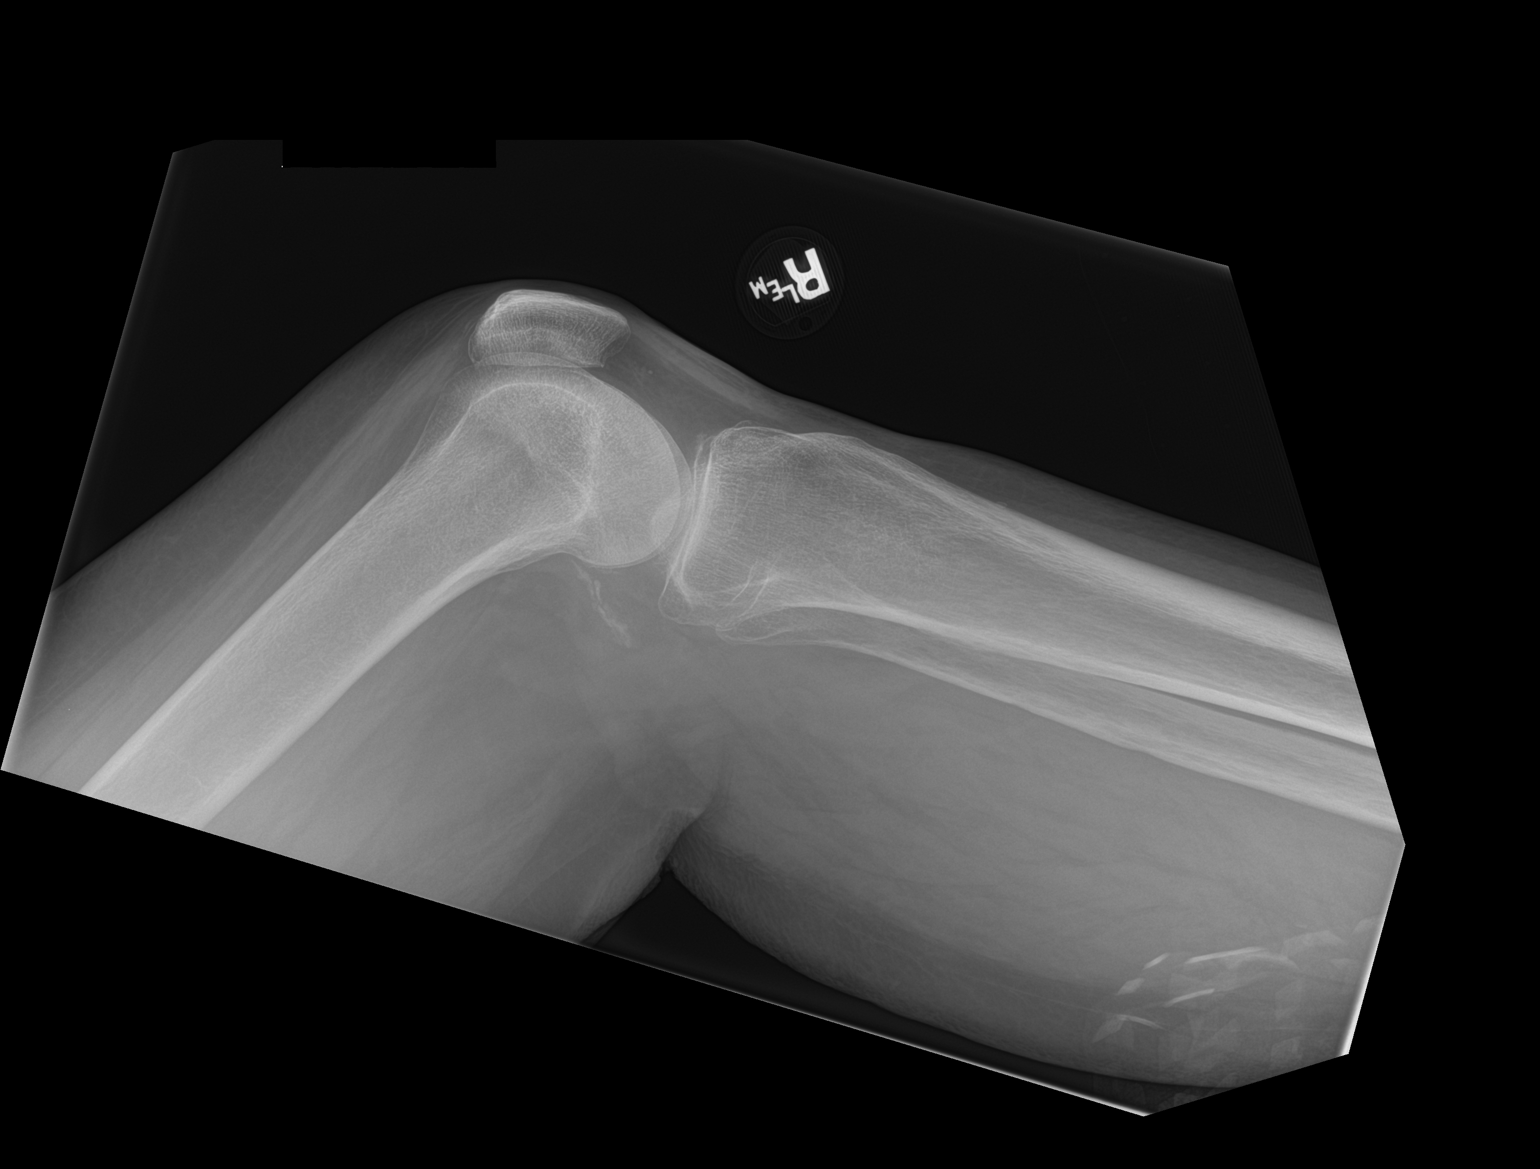

[4 of 4 positions shown; findings below may reference images not displayed]

FINDINGS: No fracture or malalignment. Screw fixation of the medial malleolus.
Status post surgical plate and multiple fixating screws at the
distal tibia. The 2 distal most fixating screws at the fibula do not
appear as closely opposed to the fixating plate compared to
previous, suggesting possible loosening.
IMPRESSION: 1. No acute osseous abnormality.
2. Postsurgical changes of the distal tibia and fibula. Possible
loosening at the 2 distal most fixating screws of the fibula.

## 2021-09-20 IMAGING — DX DG FOREARM 2V*L*
2 series · 2 of 2 positions shown · non-contrast
Comparison: None.

CLINICAL DATA: Pain post fall

EXAM:
LEFT FOREARM - 2 VIEW

[forearm ap]
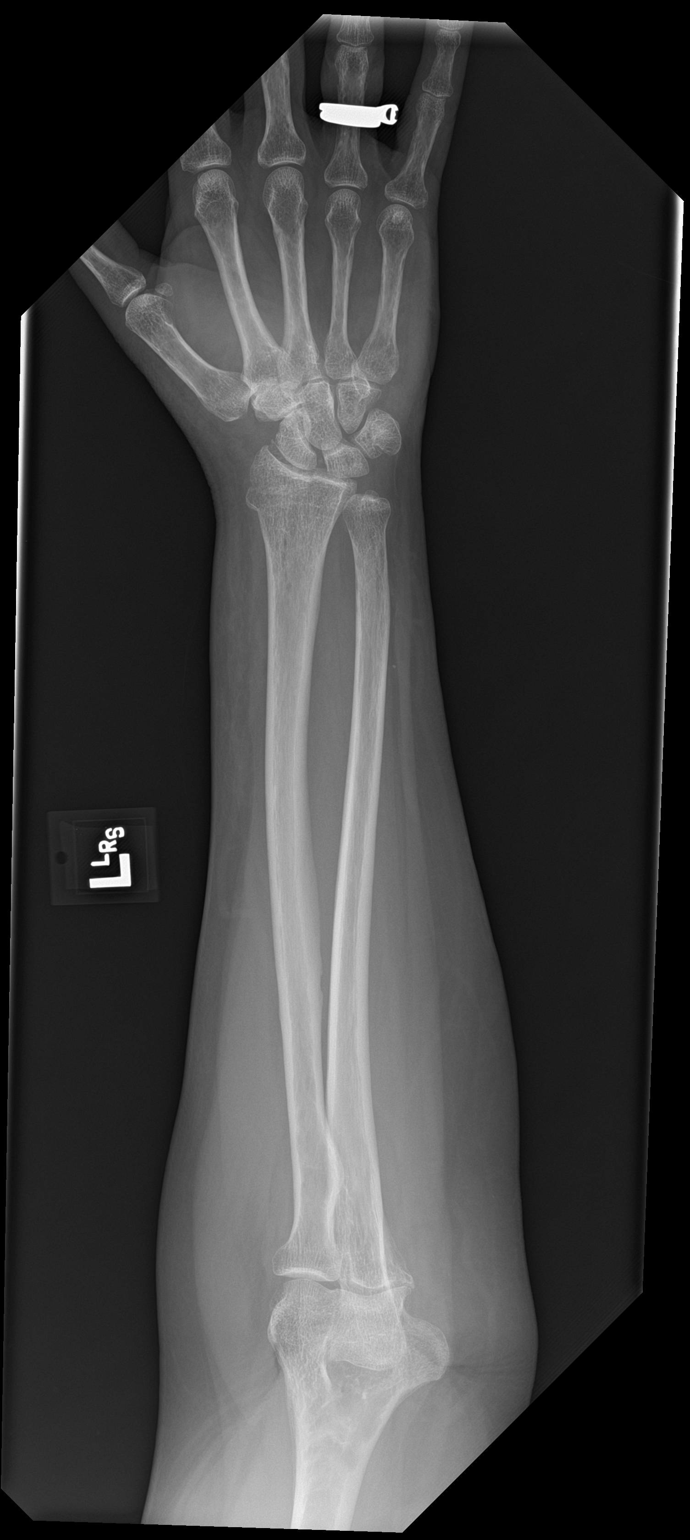

[forearm lat]
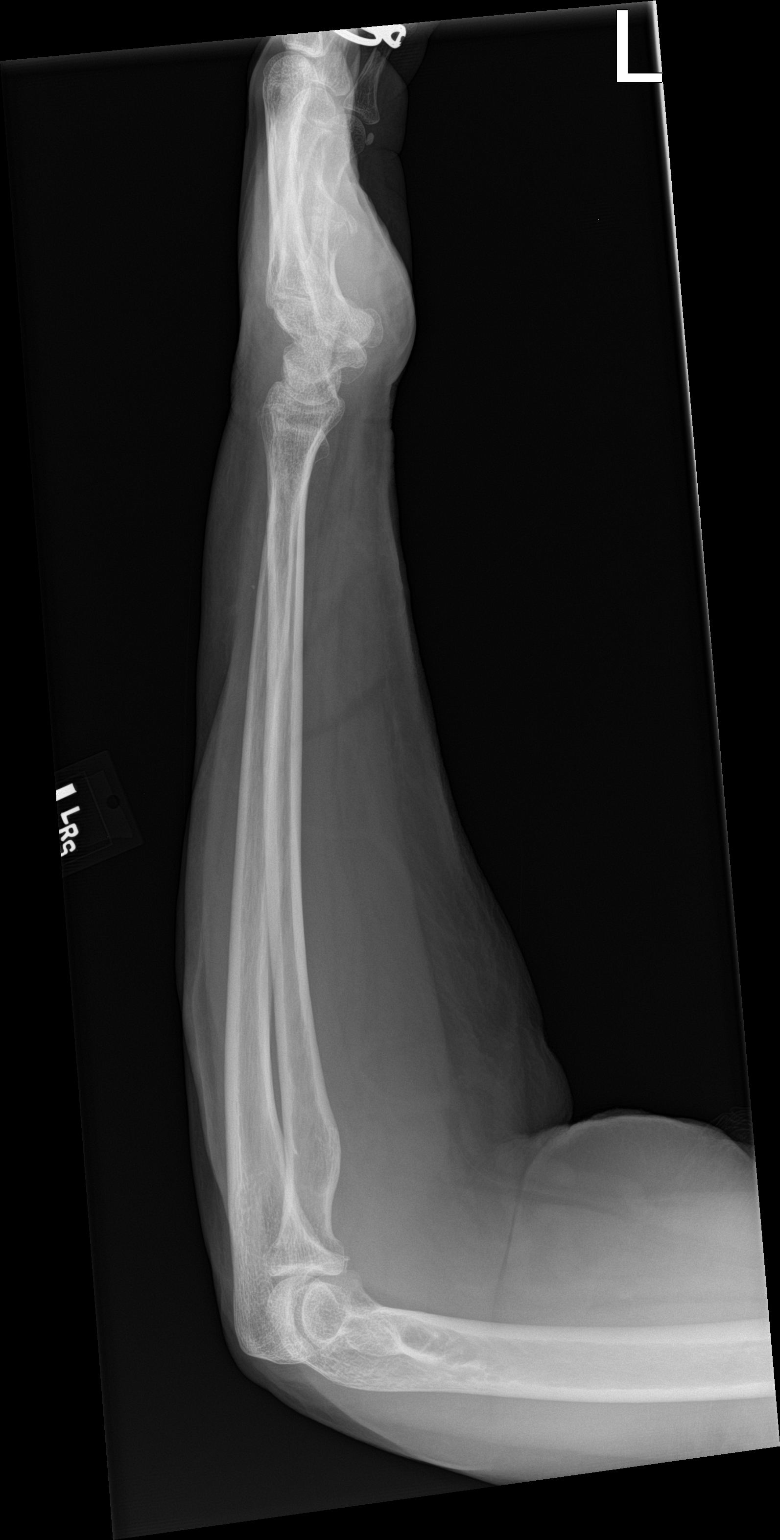

[2 of 2 positions shown; findings below may reference images not displayed]

FINDINGS: Probable acute nondisplaced, slightly impacted intra-articular
distal radius fracture. No significant elbow effusion. Radial head
alignment is normal.
IMPRESSION: Findings suspicious for acute nondisplaced intra-articular distal
radius fracture

## 2021-09-20 IMAGING — DX DG TIBIA/FIBULA 2V*L*
4 series · 4 of 4 positions shown · non-contrast
Comparison: None.

CLINICAL DATA: Pain post fall

EXAM:
LEFT TIBIA AND FIBULA - 2 VIEW

[tibia ap (1 of 2)]
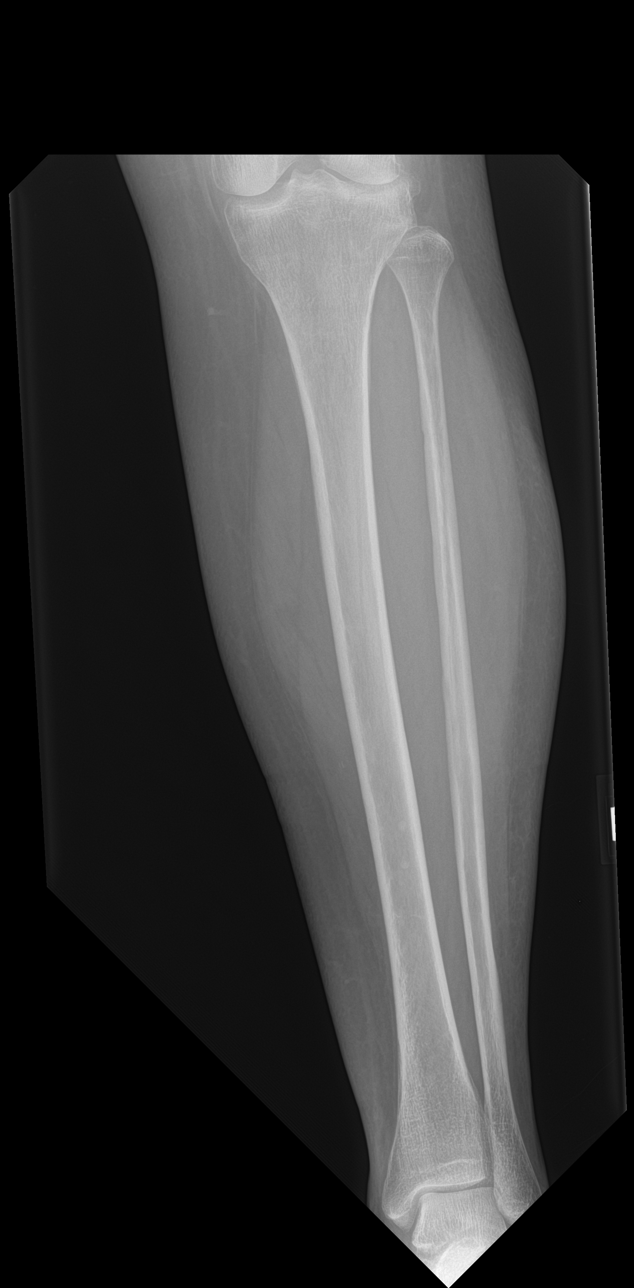

[tibia ap (2 of 2)]
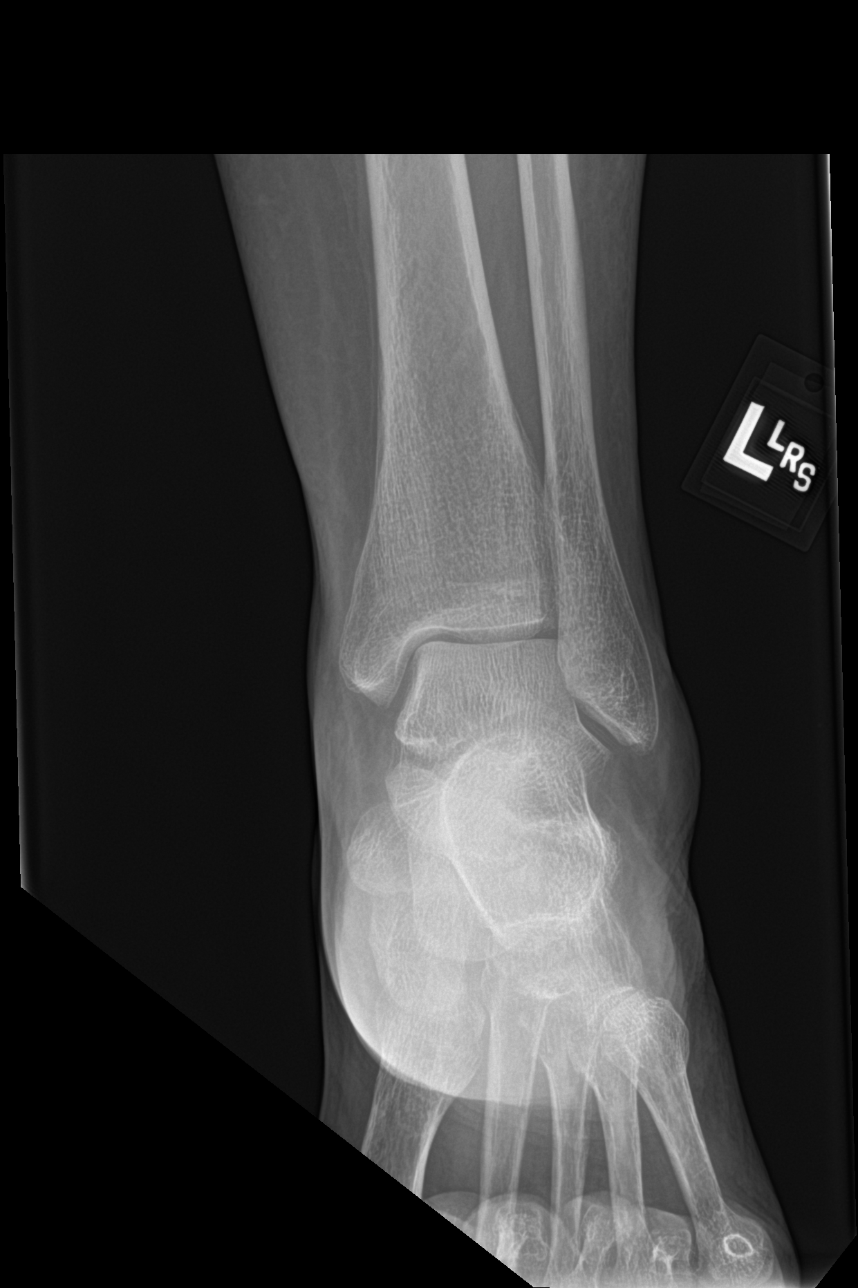

[tibia lat (1 of 2)]
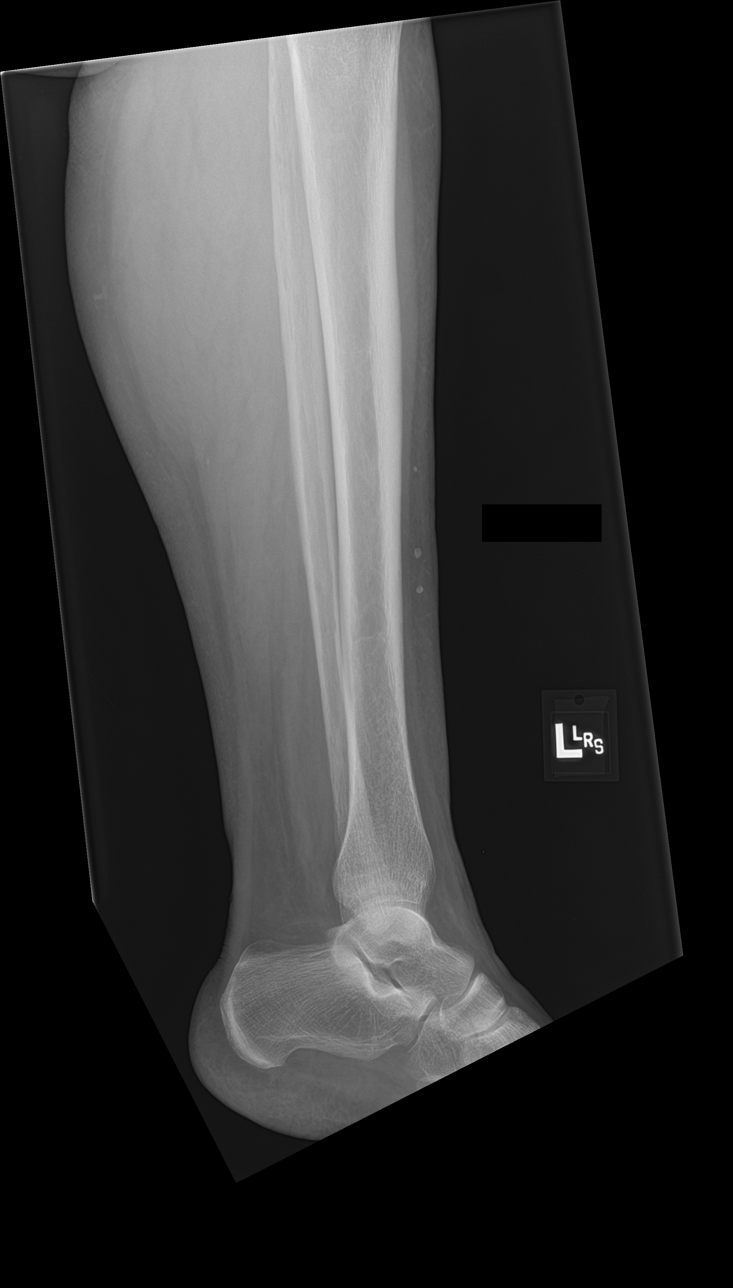

[tibia lat (2 of 2)]
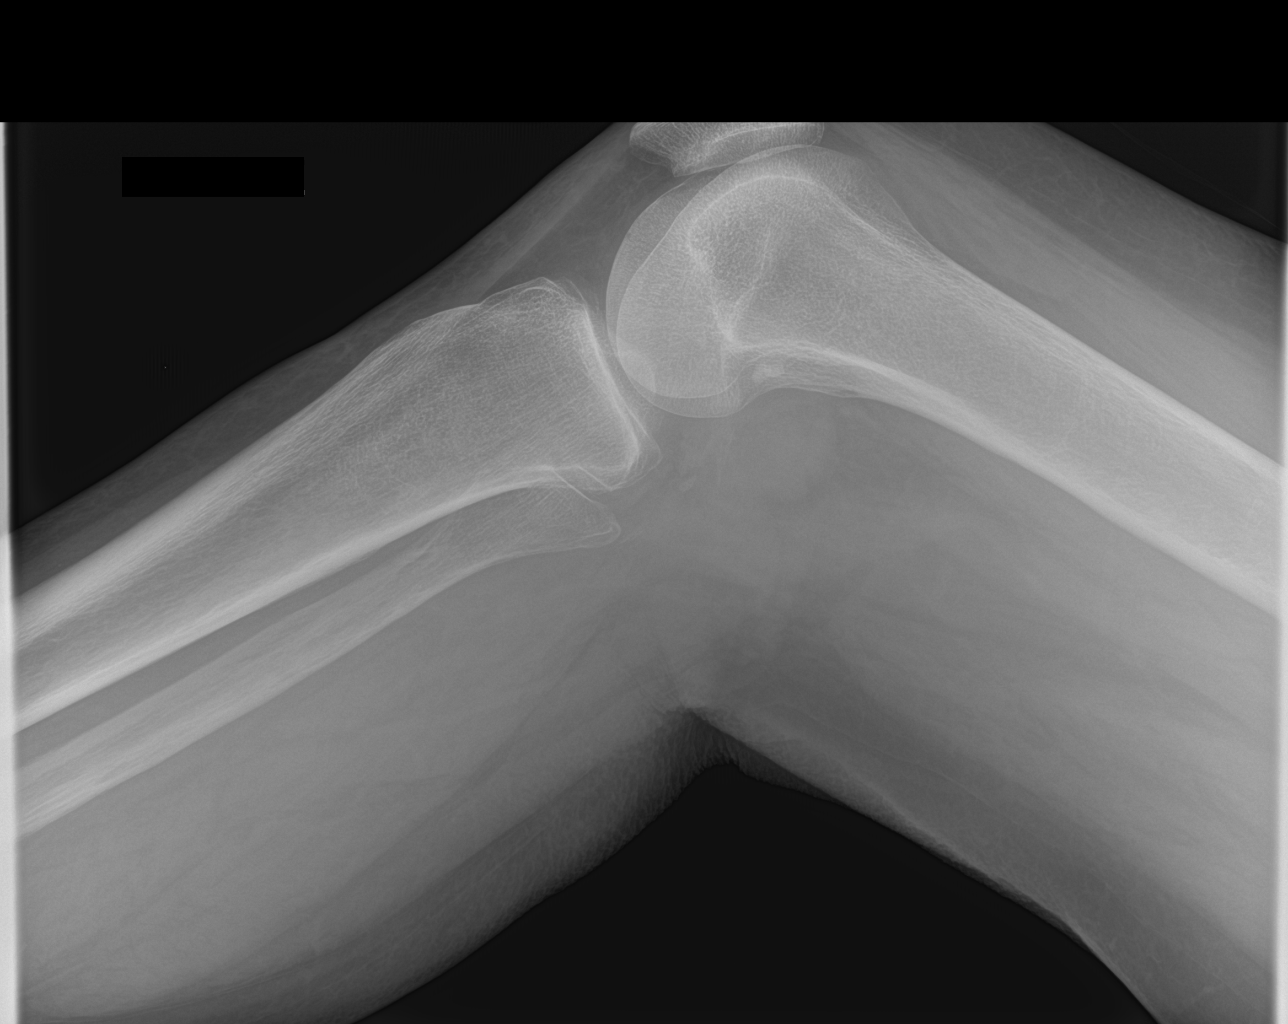

[4 of 4 positions shown; findings below may reference images not displayed]

FINDINGS: No fracture or malalignment.  Soft tissues are unremarkable.
IMPRESSION: Negative.

## 2021-09-30 ENCOUNTER — Encounter: Payer: Self-pay | Admitting: Nurse Practitioner

## 2021-10-03 ENCOUNTER — Other Ambulatory Visit: Payer: Self-pay | Admitting: Physician Assistant

## 2021-11-16 DIAGNOSIS — H401111 Primary open-angle glaucoma, right eye, mild stage: Secondary | ICD-10-CM | POA: Diagnosis not present

## 2021-11-16 DIAGNOSIS — H401123 Primary open-angle glaucoma, left eye, severe stage: Secondary | ICD-10-CM | POA: Diagnosis not present

## 2021-11-16 DIAGNOSIS — H04123 Dry eye syndrome of bilateral lacrimal glands: Secondary | ICD-10-CM | POA: Diagnosis not present

## 2021-12-05 ENCOUNTER — Emergency Department (HOSPITAL_BASED_OUTPATIENT_CLINIC_OR_DEPARTMENT_OTHER)
Admit: 2021-12-05 | Discharge: 2021-12-05 | Disposition: A | Discharge status: Home / Self Care | Payer: Medicare Other | Attending: Neurology | Admitting: Neurology

## 2021-12-05 ENCOUNTER — Other Ambulatory Visit: Payer: Self-pay

## 2021-12-05 ENCOUNTER — Observation Stay
Admission: EM | Admit: 2021-12-05 | Discharge: 2021-12-06 | Disposition: A | Discharge status: Home Health | Payer: Medicare Other | Attending: Internal Medicine | Admitting: Internal Medicine

## 2021-12-05 ENCOUNTER — Encounter: Payer: Self-pay | Admitting: Intensive Care

## 2021-12-05 ENCOUNTER — Emergency Department: Payer: Medicare Other

## 2021-12-05 DIAGNOSIS — Z8673 Personal history of transient ischemic attack (TIA), and cerebral infarction without residual deficits: Secondary | ICD-10-CM | POA: Diagnosis not present

## 2021-12-05 DIAGNOSIS — Z86718 Personal history of other venous thrombosis and embolism: Secondary | ICD-10-CM | POA: Insufficient documentation

## 2021-12-05 DIAGNOSIS — I6389 Other cerebral infarction: Secondary | ICD-10-CM

## 2021-12-05 DIAGNOSIS — Z7982 Long term (current) use of aspirin: Secondary | ICD-10-CM | POA: Insufficient documentation

## 2021-12-05 DIAGNOSIS — R7303 Prediabetes: Secondary | ICD-10-CM | POA: Insufficient documentation

## 2021-12-05 DIAGNOSIS — I2510 Atherosclerotic heart disease of native coronary artery without angina pectoris: Secondary | ICD-10-CM | POA: Insufficient documentation

## 2021-12-05 DIAGNOSIS — I10 Essential (primary) hypertension: Secondary | ICD-10-CM | POA: Diagnosis present

## 2021-12-05 DIAGNOSIS — I639 Cerebral infarction, unspecified: Secondary | ICD-10-CM | POA: Diagnosis not present

## 2021-12-05 DIAGNOSIS — R531 Weakness: Secondary | ICD-10-CM | POA: Diagnosis present

## 2021-12-05 DIAGNOSIS — R2681 Unsteadiness on feet: Secondary | ICD-10-CM | POA: Diagnosis not present

## 2021-12-05 DIAGNOSIS — E785 Hyperlipidemia, unspecified: Secondary | ICD-10-CM | POA: Diagnosis present

## 2021-12-05 DIAGNOSIS — M47812 Spondylosis without myelopathy or radiculopathy, cervical region: Secondary | ICD-10-CM | POA: Diagnosis not present

## 2021-12-05 DIAGNOSIS — Z86711 Personal history of pulmonary embolism: Secondary | ICD-10-CM | POA: Insufficient documentation

## 2021-12-05 DIAGNOSIS — Z79899 Other long term (current) drug therapy: Secondary | ICD-10-CM | POA: Diagnosis not present

## 2021-12-05 DIAGNOSIS — M5412 Radiculopathy, cervical region: Secondary | ICD-10-CM | POA: Diagnosis not present

## 2021-12-05 DIAGNOSIS — M6281 Muscle weakness (generalized): Secondary | ICD-10-CM | POA: Diagnosis not present

## 2021-12-05 DIAGNOSIS — M25522 Pain in left elbow: Secondary | ICD-10-CM | POA: Diagnosis not present

## 2021-12-05 DIAGNOSIS — M4312 Spondylolisthesis, cervical region: Secondary | ICD-10-CM | POA: Diagnosis not present

## 2021-12-05 DIAGNOSIS — Z7409 Other reduced mobility: Secondary | ICD-10-CM | POA: Diagnosis not present

## 2021-12-05 LAB — COMPREHENSIVE METABOLIC PANEL
ALT: 18 U/L (ref 0–44)
AST: 23 U/L (ref 15–41)
Albumin: 4.2 g/dL (ref 3.5–5.0)
Alkaline Phosphatase: 60 U/L (ref 38–126)
Anion gap: 6 (ref 5–15)
BUN: 14 mg/dL (ref 8–23)
CO2: 25 mmol/L (ref 22–32)
Calcium: 9.7 mg/dL (ref 8.9–10.3)
Chloride: 108 mmol/L (ref 98–111)
Creatinine, Ser: 0.83 mg/dL (ref 0.44–1.00)
GFR, Estimated: >60 mL/min (ref >60)
Glucose, Bld: 113 mg/dL — ABNORMAL HIGH (ref 70–99)
Potassium: 4.3 mmol/L (ref 3.5–5.1)
Sodium: 139 mmol/L (ref 135–145)
Total Bilirubin: 0.5 mg/dL (ref 0.3–1.2)
Total Protein: 7.3 g/dL (ref 6.5–8.1)

## 2021-12-05 LAB — CBC WITH DIFFERENTIAL/PLATELET
Abs Immature Granulocytes: 0.01 10*3/uL (ref 0.00–0.07)
Basophils Absolute: 0.1 10*3/uL (ref 0.0–0.1)
Basophils Relative: 1 %
Eosinophils Absolute: 0.3 10*3/uL (ref 0.0–0.5)
Eosinophils Relative: 4 %
HCT: 44.5 % (ref 36.0–46.0)
Hemoglobin: 14.5 g/dL (ref 12.0–15.0)
Immature Granulocytes: 0 %
Lymphocytes Relative: 36 %
Lymphs Abs: 2.4 10*3/uL (ref 0.7–4.0)
MCH: 31.3 pg (ref 26.0–34.0)
MCHC: 32.6 g/dL (ref 30.0–36.0)
MCV: 95.9 fL (ref 80.0–100.0)
Monocytes Absolute: 0.8 10*3/uL (ref 0.1–1.0)
Monocytes Relative: 12 %
Neutro Abs: 3.2 10*3/uL (ref 1.7–7.7)
Neutrophils Relative %: 47 %
Platelets: 256 10*3/uL (ref 150–400)
RBC: 4.64 MIL/uL (ref 3.87–5.11)
RDW: 12.5 % (ref 11.5–15.5)
WBC: 6.7 10*3/uL (ref 4.0–10.5)
nRBC: 0 % (ref 0.0–0.2)

## 2021-12-05 LAB — ECHOCARDIOGRAM COMPLETE BUBBLE STUDY
AR max vel: 1.7 cm2
AV Area VTI: 1.85 cm2
AV Area mean vel: 1.64 cm2
AV Mean grad: 3 mmHg
AV Peak grad: 6.1 mmHg
Ao pk vel: 1.23 m/s
Area-P 1/2: 2.87 cm2
S' Lateral: 2.6 cm

## 2021-12-05 LAB — HEMOGLOBIN A1C
Hgb A1c MFr Bld: 6 % — ABNORMAL HIGH (ref 4.8–5.6)
Mean Plasma Glucose: 125.5 mg/dL

## 2021-12-05 LAB — LDL CHOLESTEROL, DIRECT: Direct LDL: 107 mg/dL — ABNORMAL HIGH (ref 0–99)

## 2021-12-05 MED ORDER — VENLAFAXINE HCL ER 75 MG PO CP24
75.0000 mg | ORAL_CAPSULE | Freq: Every day | ORAL | Status: DC
  Administered 2021-12-06: 75 mg via ORAL
  Filled 2021-12-05: qty 1

## 2021-12-05 MED ORDER — ENOXAPARIN SODIUM 40 MG/0.4ML IJ SOSY
40.0000 mg | PREFILLED_SYRINGE | INTRAMUSCULAR | Status: DC
  Administered 2021-12-05: 40 mg via SUBCUTANEOUS
  Filled 2021-12-05: qty 0.4

## 2021-12-05 MED ORDER — INFLUENZA VAC A&B SA ADJ QUAD 0.5 ML IM PRSY
0.5000 mL | PREFILLED_SYRINGE | INTRAMUSCULAR | Status: DC
  Filled 2021-12-05: qty 0.5

## 2021-12-05 MED ORDER — ALBUTEROL SULFATE (2.5 MG/3ML) 0.083% IN NEBU: 3.0000 mL | INHALATION_SOLUTION | Freq: Four times a day (QID) | RESPIRATORY_TRACT | Status: DC | PRN

## 2021-12-05 MED ORDER — ROSUVASTATIN CALCIUM 20 MG PO TABS
20.0000 mg | ORAL_TABLET | Freq: Every day | ORAL | Status: DC
  Filled 2021-12-05: qty 1

## 2021-12-05 MED ORDER — STROKE: EARLY STAGES OF RECOVERY BOOK: Freq: Once | Status: AC

## 2021-12-05 MED ORDER — ASPIRIN 81 MG PO CHEW
324.0000 mg | CHEWABLE_TABLET | Freq: Once | ORAL | Status: DC
  Filled 2021-12-05: qty 4

## 2021-12-05 MED ORDER — POLYETHYLENE GLYCOL 3350 17 G PO PACK
17.0000 g | PACK | Freq: Every day | ORAL | Status: DC
  Filled 2021-12-05: qty 1

## 2021-12-05 MED ORDER — CLOPIDOGREL BISULFATE 75 MG PO TABS
75.0000 mg | ORAL_TABLET | Freq: Every day | ORAL | Status: DC
  Administered 2021-12-05 – 2021-12-06 (×2): 75 mg via ORAL
  Filled 2021-12-05 (×2): qty 1

## 2021-12-05 MED ORDER — ACETAMINOPHEN 325 MG RE SUPP: 650.0000 mg | RECTAL | Status: DC | PRN

## 2021-12-05 MED ORDER — SENNOSIDES-DOCUSATE SODIUM 8.6-50 MG PO TABS: 1.0000 | ORAL_TABLET | Freq: Every evening | ORAL | Status: DC | PRN

## 2021-12-05 MED ORDER — ACETAMINOPHEN 500 MG PO TABS
500.0000 mg | ORAL_TABLET | Freq: Three times a day (TID) | ORAL | Status: DC | PRN
  Administered 2021-12-05 – 2021-12-06 (×2): 500 mg via ORAL
  Filled 2021-12-05 (×2): qty 1

## 2021-12-05 MED ORDER — ACETAMINOPHEN 325 MG PO TABS: 650.0000 mg | ORAL_TABLET | ORAL | Status: DC | PRN

## 2021-12-05 MED ORDER — PILOCARPINE HCL 5 MG PO TABS
5.0000 mg | ORAL_TABLET | Freq: Two times a day (BID) | ORAL | Status: DC
  Administered 2021-12-05 – 2021-12-06 (×2): 5 mg via ORAL
  Filled 2021-12-05 (×2): qty 1

## 2021-12-05 MED ORDER — GADOBUTROL 1 MMOL/ML IV SOLN
7.5000 mL | Freq: Once | INTRAVENOUS | Status: AC | PRN
  Administered 2021-12-05: 7.5 mL via INTRAVENOUS

## 2021-12-05 MED ORDER — LORAZEPAM 0.5 MG PO TABS
0.5000 mg | ORAL_TABLET | Freq: Once | ORAL | Status: AC | PRN
  Administered 2021-12-05: 0.5 mg via ORAL
  Filled 2021-12-05: qty 1

## 2021-12-05 MED ORDER — VITAMIN B-12 1000 MCG PO TABS
1000.0000 ug | ORAL_TABLET | Freq: Every day | ORAL | Status: DC
  Administered 2021-12-06: 1000 ug via ORAL
  Filled 2021-12-05 (×2): qty 1

## 2021-12-05 MED ORDER — ACETAMINOPHEN 160 MG/5ML PO SOLN: 650.0000 mg | ORAL | Status: DC | PRN

## 2021-12-05 NOTE — Assessment & Plan Note (Signed)
A1c evaluated today and elevated at 6.0%.  Discussed importance of dietary adjustments at home.  No indication for SSI at this time.

## 2021-12-05 NOTE — ED Notes (Signed)
Pt up and walked to bathroom. Pt able to use bathroom. Pt stable on feet with cane

## 2021-12-05 NOTE — ED Notes (Signed)
RN called lab to see if the LCL and A1c can be added on. They said they would try, but if they dont have enough blood they will call and let me know.

## 2021-12-05 NOTE — ED Triage Notes (Signed)
Patient c/o left arm pain that started last night and reports waking up this AM and unable to move like normal. Reports pain down spine that started last night.   This AM reports her right arm is starting to hurt in her elbow.

## 2021-12-05 NOTE — ED Notes (Addendum)
PT states last night her left arm started to hurt and become heavy. Pt states a stroke 20 years ago with left sided deficits. On exam, pt has a weak left arm grip. Please see assessment for more information. Pt on cardiac, bp and pulse ox monitor. EDP at bedside. Pt states the right arm started to hurt this morning

## 2021-12-05 NOTE — H&P (Signed)
History and Physical    Patient: Virginia Phillips MWU:[id number] DOB: 01-20-1946 DOA: 12/05/2021 DOS: the patient was seen and examined on 12/05/2021 PCP: Jonetta Osgood, NP  Patient coming from: Home  Chief Complaint:  Chief Complaint  Patient presents with   Arm Pain    left   HPI: [name] is a 76 y.o. female with medical history significant of CVA, HTN, pre-diabetes, HLD, Sjogren's Syndrome, DVT/PE (2016) who presents to the ED with c/o left arm weakness.   [name] states that she has been experiencing generalized fatigue over the last several days and then approximately 2 days ago, began to develop left forearm and elbow pain.  Last night, she noticed that her left forearm was weaker compared to her right in addition to her pain.  Her symptoms have been improving since she arrived to the ED.    She endorses chronic bilateral lower extremity weakness for which she has been seeing her primary care doctor but otherwise denies any other focal abnormalities at this time including headache, difficulty speaking or swallowing, vision changes, right arm weakness.  She denies any recent illness including fever, chills, congestion, cough, chest pain, shortness of breath, nausea, vomiting, diarrhea, abdominal pain.  ED Course:  On arrival to the ED, patient was normotensive at 131/83 with heart rate of 71.  She was saturating at 93% on room air.  She was afebrile.  MRI brain demonstrated punctate acute infarct in right parietal lobe with no other large vessel occlusion.  Neurology consulted.  TRH contacted for admission.  Review of Systems: As mentioned in the history of present illness. All other systems reviewed and are negative. Past Medical History:  Diagnosis Date   Coronary artery disease    GERD (gastroesophageal reflux disease)    Glaucoma    Headache    migraines   History of shingles    Hyperlipidemia    Hypertension    Pre-diabetes    Pulmonary embolism on right [facility]) 2007    right lung   Sjogren's syndrome ([place])    Stroke ([place]) 1998   Past Surgical History:  Procedure Laterality Date   ANKLE FRACTURE SURGERY Right 2007   COLONOSCOPY WITH PROPOFOL N/A 05/10/2017   Procedure: COLONOSCOPY WITH PROPOFOL;  Surgeon: [name], MD;  Location: [facility] ENDOSCOPY;  Service: Endoscopy;  Laterality: N/A;   ESOPHAGOGASTRODUODENOSCOPY (EGD) WITH PROPOFOL N/A 10/17/2017   Procedure: ESOPHAGOGASTRODUODENOSCOPY (EGD) WITH PROPOFOL;  Surgeon: [name], MD;  Location: [facility] ENDOSCOPY;  Service: Endoscopy;  Laterality: N/A;   GANGLION CYST EXCISION Right    wrist   HERNIA REPAIR     2011   KNEE ARTHROSCOPY W/ ACL RECONSTRUCTION Right 2008   KNEE SURGERY  08/05/2007   LASIK     NISSEN FUNDOPLICATION  [id number]   Social History:  reports that she has never smoked. She has never used smokeless tobacco. She reports that she does not drink alcohol and does not use drugs.  Allergies  Allergen Reactions   Linzess [Linaclotide] Other (See Comments)    Pt had bad dehydration   Amoxicillin Other (See Comments)    Sleepy Did it involve swelling of the face/tongue/throat, SOB, or low BP? No Did it involve sudden or severe rash/hives, skin peeling, or any reaction on the inside of your mouth or nose? No Did you need to seek medical attention at a hospital or doctor's office? No When did it last happen?      5-10 years If all above  answers are "NO", may proceed with cephalosporin use.    Blue Dyes (Parenteral)     headaches   Butalbital-Aspirin-Caffeine Other (See Comments)    headache   Clindamycin/Lincomycin Other (See Comments)    Has blue dye    Clobetasol Other (See Comments)    unknown   Dexlansoprazole Other (See Comments)    headache   Diphenhydramine Other (See Comments)    Migraines. Tolerates dye free benadryl   Doxycycline Other (See Comments)    Unknown reaction   Esomeprazole Other (See Comments)    Migraines    Gabapentin Other (See Comments)     unknown   Green Dye Other (See Comments)    Headaches    Iodinated Contrast Media Other (See Comments)    Unknown reaction    Meloxicam     Lightheaded    Metronidazole     Redness of the face   Nortriptyline Hcl     Migraine   Prednisolone Acetate Itching   Tetracycline Other (See Comments)    Unknown reaction   Baby Oil Rash   Cortisone Other (See Comments) and Rash    Face purple, migraine    Diphenhydramine Hcl Rash and Other (See Comments)    As long as no dyes in it. SHE USES CLEAR ONLY   Levofloxacin Rash   Mineral Oil Rash   Prednisone Swelling, Rash and Other (See Comments)    Turned red RED SPLOTCHY FACE   Sulfa Antibiotics Rash    Family History  Problem Relation Age of Onset   Congestive Heart Failure Mother    Parkinson's disease Mother    Diabetes Father    Scleroderma Sister    Cancer Brother     Prior to Admission medications   Medication Sig Start Date End Date Taking? Authorizing Provider  acetaminophen (TYLENOL) 650 MG CR tablet Take 650 mg by mouth every 8 (eight) hours as needed for pain.    [provider]  albuterol (VENTOLIN HFA) 108 (90 Base) MCG/ACT inhaler INHALE 2 PUFFS INTO THE LUNGS EVERY 6 HOURS AS NEEDED FOR WHEEZING OR SHORTNESS OF BREATH 11/11/20   Jonetta Osgood, NP  amLODipine (NORVASC) 5 MG tablet TAKE 1 TABLET(5 MG) BY MOUTH DAILY 06/24/21   Minna Merritts, MD  ASPIRIN 81 PO Take 81 mg by mouth daily.    [provider]  aspirin-acetaminophen-caffeine (EXCEDRIN MIGRAINE) [phone] MG tablet Take 2 tablets by mouth daily as needed for headache or migraine.    [provider]  aspirin-sod bicarb-citric acid (ALKA-SELTZER) 325 MG TBEF tablet Take 650 mg by mouth every 6 (six) hours as needed (indigestion).    [provider]  Biotin 5 MG CAPS Take 5,000 mg by mouth daily at 2 PM.    [provider]  BLACK CURRANT SEED OIL PO Take 1 capsule by mouth daily at 2 PM.    [provider]  carboxymethylcellulose 1 % ophthalmic solution 1 drop 3 (three) times daily.    [provider]  Cholecalciferol (VITAMIN D) 2000 UNITS CAPS Take 2,000 Units by mouth daily at 2 PM.     [provider]  Coenzyme Q10 (CO Q10) 200 MG CAPS Take 200 mg by mouth daily at 2 PM.     [provider]  furosemide (LASIX) 20 MG tablet Take 1 tablet (20 mg total) by mouth daily as needed. For leg swelling or shortness of breath 04/12/21   Minna Merritts, MD  Garlic 5176 MG CAPS  Take 6,000 mg by mouth daily as needed.    [provider]  HYDROcodone bit-homatropine (HYDROMET) 5-1.5 MG/5ML syrup Take 5 mLs by mouth every 4 (four) hours as needed for cough. 11/10/20   Jonetta Osgood, NP  ibuprofen (ADVIL,MOTRIN) 200 MG tablet Take 400 mg by mouth daily as needed for headache or moderate pain.     [provider]  Lancets [facility] ULTRASOFT) lancets Check sugar once daily DX 73.9 needs for one touch ultra 2 lancets 10/25/15   Jerrol Banana., MD  Magnesium 100 MG TABS Take by mouth daily.    [provider]  Magnesium 400 MG TABS Take by mouth daily.    [provider]  meclizine (ANTIVERT) 12.5 MG tablet One tab po tid prn for dizziness 09/16/20   Lavera Guise, MD  methocarbamol (ROBAXIN) 500 MG tablet Take 1 tablet (500 mg total) by mouth 3 (three) times daily. 03/03/21   Jonetta Osgood, NP  Misc Natural Products (PETADOLEX 50) 50 MG CAPS Take 100 mg by mouth daily.    [provider]  multivitamin-lutein (OCUVITE-LUTEIN) CAPS capsule Take 1 capsule by mouth daily.    [provider]  Omega-3 Fatty Acids (OMEGA-3 FISH OIL) 1200 MG CAPS Take by mouth.    [provider]  omeprazole (PRILOSEC) 20 MG capsule Take 20 mg by mouth daily as needed.    [provider]  ONE TOUCH ULTRA TEST test strip CHECK BLOOD SUGAR ONCE DAILY AS DIRECTED 11/13/16   Jerrol Banana., MD  pilocarpine  (SALAGEN) 5 MG tablet Take 5 mg by mouth 2 (two) times daily.    [provider]  polyethylene glycol (MIRALAX / GLYCOLAX) packet Take 17 g by mouth daily.     [provider]  Polyvinyl Alcohol-Povidone (REFRESH OP) Place 1 drop into both eyes daily as needed (dry eyes).    [provider]  potassium chloride (KLOR-CON M) 10 MEQ tablet Take 1 tablet (10 mEq total) by mouth daily as needed. Take with lasix 04/12/21   Minna Merritts, MD  pyridOXINE (VITAMIN B-6) 100 MG tablet Take 100 mg by mouth daily at 2 PM.    [provider]  Riboflavin (B2) 100 MG TABS Take 100 mg by mouth 2 (two) times daily.    [provider]  simvastatin (ZOCOR) 5 MG tablet Take 1 tablet (5 mg total) by mouth daily. 08/29/21   Dunn, Areta Haber, PA-C  SUPER B COMPLEX/C PO Take 1 tablet by mouth daily at 2 PM.    [provider]  Thiamine HCl (VITAMIN B-1) 250 MG tablet Take 250 mg by mouth daily at 2 PM.    [provider]  valsartan (DIOVAN) 80 MG tablet TAKE 1 TABLET(80 MG) BY MOUTH EVERY DAY 08/11/21   Minna Merritts, MD  venlafaxine XR (EFFEXOR-XR) 75 MG 24 hr capsule Take 1 capsule (75 mg total) by mouth daily with breakfast. 08/08/21   Jonetta Osgood, NP  vitamin B-12 (CYANOCOBALAMIN) 1000 MCG tablet Take 1,000 mcg by mouth daily at 2 PM.    [provider]  vitamin C (ASCORBIC ACID) 500 MG tablet Take 500 mg by mouth daily at 2 PM.    [provider]   Physical Exam: Vitals:   12/05/21 0945 12/05/21 1122 12/05/21 1200 12/05/21 1411  BP:  (!) 162/82 132/81 (!) 148/84  Pulse: 60 62 62 71  Resp: _0 Temp:  97.9 F (36.6 C)  TempSrc:      SpO2: 95% 95% 95% 97%  Weight:      Height:       Physical Exam Vitals and nursing note reviewed.  Constitutional:      General: She is not in acute distress.    Appearance: She is normal weight.  HENT:     Head: Normocephalic and atraumatic.     Mouth/Throat:     Mouth: Mucous  membranes are dry.     Pharynx: Oropharynx is clear.  Eyes:     Extraocular Movements: Extraocular movements intact.     Conjunctiva/sclera: Conjunctivae normal.     Pupils: Pupils are equal, round, and reactive to light.  Cardiovascular:     Rate and Rhythm: Normal rate and regular rhythm.     Heart sounds: No murmur heard.    No gallop.  Pulmonary:     Effort: Pulmonary effort is normal. No respiratory distress.     Breath sounds: Normal breath sounds. No wheezing, rhonchi or rales.  Abdominal:     General: Bowel sounds are normal. There is no distension.     Palpations: Abdomen is soft.     Tenderness: There is no abdominal tenderness. There is no guarding.  Musculoskeletal:     Right lower leg: No edema.     Left lower leg: No edema.  Skin:    General: Skin is warm and dry.  Neurological:     Mental Status: She is alert.     Cranial Nerves: Cranial nerves 2-12 are intact. No dysarthria or facial asymmetry.     Sensory: Sensation is intact.  Psychiatric:        Mood and Affect: Mood normal.        Behavior: Behavior normal.        Thought Content: Thought content normal.        Judgment: Judgment normal.    Data Reviewed:  Lab Results  Component Value Date   WBC 6.7 12/05/2021   HGB 14.5 12/05/2021   HCT 44.5 12/05/2021   MCV 95.9 12/05/2021   PLT 256 12/05/2021   Lab Results  Component Value Date   NA 139 12/05/2021   K 4.3 12/05/2021   CO2 25 12/05/2021   GLUCOSE 113 (H) 12/05/2021   BUN 14 12/05/2021   CREATININE 0.83 12/05/2021   CALCIUM 9.7 12/05/2021   EGFR 72 08/09/2021   GFRNONAA >60 12/05/2021   Lab Results  Component Value Date   HGBA1C 6.0 (H) 12/05/2021   MRI with evidence of punctate acute infarct in the right parietal lobe.  No evidence of hemorrhage.  No intracranial occlusion or significant stenosis of the neck.  MR C-spine with evidence of cervical spondylosis.  MRA head and neck with no evidence of large vessel occlusion.  Results are  pending, will review when available.  Assessment and Plan: * Acute CVA (cerebrovascular accident) [facility]) Patient presenting with 2-day history of left forearm pain that transitioned into weakness in the last 24 hours.  MRI with evidence of punctate acute infarct of the right parietal lobe.  There is also sequelae of severe chronic microvascular ischemic changes, so suspect this is secondary to small vessel disease.   - Neurology consulted; appreciate their recommendations - TTE pending - Telemetry monitoring while admitted - Discontinue home simvastatin - Start high intensity Crestor given LDL above goal - Discussed lifestyle adjustments to improve A1c - PT/OT evaluation - Aspirin 81 mg daily for 3 weeks - Plavix 75 mg daily indefinitely -  Holding home antihypertensives to allow for permissive hypertension for 48 hours  Prediabetes A1c evaluated today and elevated at 6.0%.  Discussed importance of dietary adjustments at home.  No indication for SSI at this time.  Essential hypertension Blood pressure well controlled on admission on home amlodipine and valsartan.  Holding at this time to allow for permissive hypertension in the setting of acute CVA.  - Holding home amlodipine and valsartan  Hyperlipidemia Patient states she has been taking her simvastatin regularly as instructed.  LDL remains above goal at this time, so we will transition to a high intensity statin.  - Discontinue home simvastatin - Start Crestor 20 mg daily   Advance Care Planning:   Code Status: Full Code   Consults: Neurology  Family Communication: Daughter updated via telephone at bedside  Severity of Illness: The appropriate patient status for this patient is OBSERVATION. Observation status is judged to be reasonable and necessary in order to provide the required intensity of service to ensure the patient's safety. The patient's presenting symptoms, physical exam findings, and initial radiographic and  laboratory data in the context of their medical condition is felt to place them at decreased risk for further clinical deterioration. Furthermore, it is anticipated that the patient will be medically stable for discharge from the hospital within 2 midnights of admission.   Author: Jose Persia, MD 12/05/2021 4:30 PM  For on call review [email or web address].

## 2021-12-05 NOTE — ED Provider Notes (Signed)
[facility] Provider Note    Event Date/Time   First MD Initiated Contact with Patient 12/05/21 0820     (approximate)   History   Arm Pain (left)   HPI  Virginia Phillips is a 76 y.o. female for evaluation of intermittent left arm weakness and slight discomfort around the elbow.  For about 2 days has felt like there is a slight sensation of weakness in her left arm particularly when she tries to move her left forearm.  Also slight feeling of numbness over the front of the left forearm.  It started a couple days ago, but was quite noticeable at about 2 AM this morning.  She reports the whole arm just felt limp as though she could not move it.  However it is since recovered this morning.  She reports a slight achiness and does sleep on the left arm frequently and thinks she may have done so yesterday.  No headache.  No fevers or chills.  No recent illness.  She has not noticed any swelling pain or injury to the left arm.  Right now it feels pretty much back to normal except she reports that just slight feeling of numbness over the left forearm pointing towards the volar surface   On review of primary care note from June 12 of this year patient noted to have history of hypertension Nash rhinitis prior stroke migraines chronic back pain and hyperlipidemia also notes to have anxiety and depression   No chest pain no headache no neck pain.  No falls or injuries.  No weakness in her lower extremities except occasionally she will feel weak in both lower legs but is not new or unusual.  She has a history of Sjogren's, but denies any history of myasthenia  Physical Exam   Triage Vital Signs: ED Triage Vitals  Enc Vitals Group     BP 12/05/21 0759 131/83     Pulse Rate 12/05/21 0759 71     Resp 12/05/21 0759 16     Temp 12/05/21 0759 97.9 F (36.6 C)     Temp Source 12/05/21 0759 Oral     SpO2 12/05/21 0759 93 %     Weight 12/05/21 0755 185 lb (83.9 kg)     Height  12/05/21 0755 5' 6"  (1.676 m)     Head Circumference --      Peak Flow --      Pain Score 12/05/21 0755 8     Pain Loc --      Pain Edu? --      Excl. in [place]? --     Most recent vital signs: Vitals:   12/05/21 1122 12/05/21 1200  BP: (!) 162/82 132/81  Pulse: 62 62  Resp: 20 16  Temp: 97.9 F (36.6 C)   SpO2: 95% 95%     General: Awake, no distress.  Very pleasant.  Is been also at bedside CV:  Good peripheral perfusion.  Normal heart tones and rate Resp:  Normal effort.  Clear bilateral Abd:  No distention.  Other:    RIGHT Right upper extremity demonstrates normal strength, good use of all muscles. No edema bruising or contusions of the right shoulder/upper arm, right elbow, right forearm / hand. Full range of motion of the right right upper extremity without pain. No evidence of trauma. Strong radial pulse. Intact median/ulnar/radial neuro-muscular exam.  LEFT Left upper extremity demonstrates normal strength, good use of all muscles. No edema bruising or contusions  of the left shoulder/upper arm, left elbow, left forearm / hand except for a small bruise over the left forearm which she reports is that she bruises easily and thinks she squeezed it at 1 point several days ago is not correspond to the area of discomfort. Full range of motion of the left  upper extremity without pain except she reports a mild sensation of tightness in her left elbow with flexion extension. No evidence of trauma. Strong radial pulse. Intact median/ulnar/radial neuro-muscular exam.  No swelling or findings of be suggestive of a DVT.  Nerve exam is normal.  Extraocular movements normal.  Facial expressions normal.  Lower extremities demonstrate 5 out of 5 strength bilaterally.  Denies sensory changes in the lower extremities bilateral.   ED Results / Procedures / Treatments   Labs (all labs ordered are listed, but only abnormal results are displayed) Labs Reviewed  COMPREHENSIVE METABOLIC PANEL -  Abnormal; Notable for the following components:      Result Value   Glucose, Bld 113 (*)    All other components within normal limits  CBC WITH DIFFERENTIAL/PLATELET  LDL CHOLESTEROL, DIRECT  HEMOGLOBIN A1C     EKG  And interpreted by me at 830 heart rate 65 QRS 90 QTc 430 Normal sinus rhythm, slight baseline artifact or EKG wander.  No evidence of acute ischemia.   RADIOLOGY  MRI brain and cervical spine ordered along with MRA head to evaluate further as to causation and rule out central cause in particular small stroke or mass lesion   Personally discussed MRI of the brain with our radiologist, he called me with critical finding denotes the patient did have evidence of a acute stroke on MRI without evidence of hemorrhage   PROCEDURES:  Critical Care performed: Yes, see critical care procedure note(s)  Procedures   MEDICATIONS ORDERED IN ED: Medications  clopidogrel (PLAVIX) tablet 75 mg (75 mg Oral Given 12/05/21 0948)  aspirin chewable tablet 324 mg (324 mg Oral Not Given 12/05/21 1204)  LORazepam (ATIVAN) tablet 0.5 mg (0.5 mg Oral Given 12/05/21 0948)  gadobutrol (GADAVIST) 1 MMOL/ML injection 7.5 mL (7.5 mLs Intravenous Contrast Given 12/05/21 1107)     IMPRESSION / MDM / ASSESSMENT AND PLAN / ED COURSE  I reviewed the triage vital signs and the nursing notes.                              Differential diagnosis includes, but is not limited to, neuropathy possibly from laying on the arm, symptoms seem to be recovering, and reports they are worse when she woke up and had been laying on the arm.  Also considerations would include stroke, demyelinating process though this seems somewhat unlikely, muscle spasm, radicular etiology, from the cervical area etc.  Reassuring that her exam has normalized now except for slight achiness through range of motion and she reports feeling much better.  Does have a history of stroke in the past.  No associated acute cardiac or pulmonary  symptoms.  No Obvious vascular abnormalities    Patient's presentation is most consistent with acute presentation with potential threat to life or bodily function.  The patient is on the cardiac monitor to evaluate for evidence of arrhythmia and/or significant heart rate changes.   Consulted with hospitalist, patient will be admitted to the service of  [place]  Patient presents with symptoms concerning for acute to subacute stroke sometime occurring in the last 3 days possibly  more acute at around 2 AM, but outside of the traditional thrombolytic window with no evidence of LVO  Labs interpreted as normal CBC normal metabolic panel  ----------------------------------------- 11:51 AM on 12/05/2021 ----------------------------------------- Updated patient and her husband both understanding agreeable with plan to move forward with hospitalization and case has been discussed and consult pending by Dr. Quinn Axe of neurology as well       FINAL CLINICAL IMPRESSION(S) / ED DIAGNOSES   Final diagnoses:  Acute stroke due to ischemia [facility])     Rx / DC Orders   ED Discharge Orders     None        Note:  This document was prepared using Dragon voice recognition software and may include unintentional dictation errors.   Delman Kitten, MD 12/05/21 [id number]

## 2021-12-05 NOTE — Assessment & Plan Note (Signed)
Patient presenting with 2-day history of left forearm pain that transitioned into weakness in the last 24 hours.  MRI with evidence of punctate acute infarct of the right parietal lobe.  There is also sequelae of severe chronic microvascular ischemic changes, so suspect this is secondary to small vessel disease.   - Neurology consulted; appreciate their recommendations - TTE pending - Telemetry monitoring while admitted - Discontinue home simvastatin - Start high intensity Crestor given LDL above goal - Discussed lifestyle adjustments to improve A1c - PT/OT evaluation - Aspirin 81 mg daily for 3 weeks - Plavix 75 mg daily indefinitely - Holding home antihypertensives to allow for permissive hypertension for 48 hours

## 2021-12-05 NOTE — ED Notes (Signed)
Provider notified that pt passed her swallow screening and been eating and drinking in the ED. Provider notified and stated she will change the diet from NPO

## 2021-12-05 NOTE — ED Notes (Signed)
Pt in MRI.

## 2021-12-05 NOTE — ED Notes (Signed)
Report given to Ashley, RN

## 2021-12-05 NOTE — Assessment & Plan Note (Signed)
Patient states she has been taking her simvastatin regularly as instructed.  LDL remains above goal at this time, so we will transition to a high intensity statin.  - Discontinue home simvastatin - Start Crestor 20 mg daily

## 2021-12-05 NOTE — Consult Note (Signed)
NEUROLOGY CONSULTATION NOTE   Date of service: December 05, 2021 Patient Name: Virginia Phillips MRN:  [id number] DOB:  Sep 21, 1945 Reason for consult: acute ischemic stroke Requesting physician: Dr. Delman Kitten _ _ _   _ __   _ __ _ _  __ __   _ __   __ _  History of Present Illness   This is a 76 year old woman with past medical history significant for CAD, headache, prior history of shingles, hyperlipidemia, hypertension, prediabetes, Sjogren's syndrome and prior stroke in 1998 with residual left nasolabial fold flattening only per patient who presents after an episode of left arm weakness and numbness last night.  Her symptoms have improved significantly although she still has some mild discomfort in her left elbow and forearm which is not tender to palpation.  She states that her weakness is primarily related to the sensory abnormality in her weakness nearly resolved with coaching.  She has no other complaints today.  I brain showed punctate area of acute ischemia in the right parietal lobe.  MRA H&N showed no sig abnl. MRI c spine showed no findings to explain weakness. TTE performed and is pending. Patient on ASA PTA.   ROS   Per HPI: all other systems reviewed and are negative  Past History   I have reviewed the following:  Past Medical History:  Diagnosis Date   Coronary artery disease    GERD (gastroesophageal reflux disease)    Glaucoma    Headache    migraines   History of shingles    Hyperlipidemia    Hypertension    Pre-diabetes    Pulmonary embolism on right [facility]) 2007   right lung   Sjogren's syndrome ([place])    Stroke ([place]) 1998   Past Surgical History:  Procedure Laterality Date   ANKLE FRACTURE SURGERY Right 2007   COLONOSCOPY WITH PROPOFOL N/A 05/10/2017   Procedure: COLONOSCOPY WITH PROPOFOL;  Surgeon: [name], MD;  Location: [facility] ENDOSCOPY;  Service: Endoscopy;  Laterality: N/A;   ESOPHAGOGASTRODUODENOSCOPY (EGD) WITH PROPOFOL N/A 10/17/2017    Procedure: ESOPHAGOGASTRODUODENOSCOPY (EGD) WITH PROPOFOL;  Surgeon: [name], MD;  Location: [facility] ENDOSCOPY;  Service: Endoscopy;  Laterality: N/A;   GANGLION CYST EXCISION Right    wrist   HERNIA REPAIR     2011   KNEE ARTHROSCOPY W/ ACL RECONSTRUCTION Right 2008   KNEE SURGERY  08/05/2007   LASIK     NISSEN FUNDOPLICATION  [id number]   Family History  Problem Relation Age of Onset   Congestive Heart Failure Mother    Parkinson's disease Mother    Diabetes Father    Scleroderma Sister    Cancer Brother    Social History   Socioeconomic History   Marital status: Married    Spouse name: Not on file   Number of children: 1   Years of education: Not on file   Highest education level: Some college, no degree  Occupational History   Occupation: retired  Tobacco Use   Smoking status: Never   Smokeless tobacco: Never  Vaping Use   Vaping Use: Never used  Substance and Sexual Activity   Alcohol use: No    Alcohol/week: 0.0 standard drinks of alcohol   Drug use: No   Sexual activity: Not on file  Other Topics Concern   Not on file  Social History Narrative   Not on file   Social Determinants of Health   Financial Resource Strain: Low Risk  (06/14/2017)   Overall  Financial Resource Strain (CARDIA)    Difficulty of Paying Living Expenses: Not hard at all  Food Insecurity: No Food Insecurity (06/14/2017)   Hunger Vital Sign    Worried About Running Out of Food in the Last Year: Never true    Ran Out of Food in the Last Year: Never true  Transportation Needs: No Transportation Needs (06/14/2017)   PRAPARE - Hydrologist (Medical): No    Lack of Transportation (Non-Medical): No  Physical Activity: Not on file  Stress: No Stress Concern Present (06/14/2017)   Page    Feeling of Stress : Only a little  Social Connections: Not on file   Allergies  Allergen Reactions    Linzess [Linaclotide] Other (See Comments)    Pt had bad dehydration   Amoxicillin Other (See Comments)    Sleepy Did it involve swelling of the face/tongue/throat, SOB, or low BP? No Did it involve sudden or severe rash/hives, skin peeling, or any reaction on the inside of your mouth or nose? No Did you need to seek medical attention at a hospital or doctor's office? No When did it last happen?      5-10 years If all above answers are "NO", may proceed with cephalosporin use.    Blue Dyes (Parenteral)     headaches   Butalbital-Aspirin-Caffeine Other (See Comments)    headache   Clindamycin/Lincomycin Other (See Comments)    Has blue dye    Clobetasol Other (See Comments)    unknown   Dexlansoprazole Other (See Comments)    headache   Diphenhydramine Other (See Comments)    Migraines. Tolerates dye free benadryl   Doxycycline Other (See Comments)    Unknown reaction   Esomeprazole Other (See Comments)    Migraines    Gabapentin Other (See Comments)    unknown   Green Dye Other (See Comments)    Headaches    Iodinated Contrast Media Other (See Comments)    Unknown reaction    Meloxicam     Lightheaded    Metronidazole     Redness of the face   Nortriptyline Hcl     Migraine   Prednisolone Acetate Itching   Tetracycline Other (See Comments)    Unknown reaction   Baby Oil Rash   Cortisone Other (See Comments) and Rash    Face purple, migraine    Diphenhydramine Hcl Rash and Other (See Comments)    As long as no dyes in it. SHE USES CLEAR ONLY   Levofloxacin Rash   Mineral Oil Rash   Prednisone Swelling, Rash and Other (See Comments)    Turned red RED SPLOTCHY FACE   Sulfa Antibiotics Rash    Medications   (Not in a hospital admission)     Current Facility-Administered Medications:    [START ON 12/06/2021]  stroke: early stages of recovery book, , Does not apply, Once, Jose Persia, MD   acetaminophen (TYLENOL) tablet 650 mg, 650 mg, Oral, Q4H PRN **OR**  acetaminophen (TYLENOL) 160 MG/5ML solution 650 mg, 650 mg, Per Tube, Q4H PRN **OR** acetaminophen (TYLENOL) suppository 650 mg, 650 mg, Rectal, Q4H PRN, Jose Persia, MD   aspirin chewable tablet 324 mg, 324 mg, Oral, Once, Delman Kitten, MD   clopidogrel (PLAVIX) tablet 75 mg, 75 mg, Oral, Daily, Derek Jack, MD, 75 mg at 12/05/21 0948   enoxaparin (LOVENOX) injection 40 mg, 40 mg, Subcutaneous, Q24H, Jose Persia, MD  senna-docusate (Senokot-S) tablet 1 tablet, 1 tablet, Oral, QHS PRN, Jose Persia, MD   [START ON 12/06/2021] venlafaxine XR (EFFEXOR-XR) 24 hr capsule 75 mg, 75 mg, Oral, Q breakfast, Jose Persia, MD  Current Outpatient Medications:    acetaminophen (TYLENOL) 650 MG CR tablet, Take 650 mg by mouth every 8 (eight) hours as needed for pain., Disp: , Rfl:    albuterol (VENTOLIN HFA) 108 (90 Base) MCG/ACT inhaler, INHALE 2 PUFFS INTO THE LUNGS EVERY 6 HOURS AS NEEDED FOR WHEEZING OR SHORTNESS OF BREATH, Disp: 20.1 g, Rfl: 1   amLODipine (NORVASC) 5 MG tablet, TAKE 1 TABLET(5 MG) BY MOUTH DAILY, Disp: 90 tablet, Rfl: 2   ASPIRIN 81 PO, Take 81 mg by mouth daily., Disp: , Rfl:    Biotin 5 MG CAPS, Take 5,000 mg by mouth daily at 2 PM., Disp: , Rfl:    BLACK CURRANT SEED OIL PO, Take 1 capsule by mouth daily at 2 PM., Disp: , Rfl:    Cholecalciferol (VITAMIN D) 2000 UNITS CAPS, Take 2,000 Units by mouth daily at 2 PM. , Disp: , Rfl:    Coenzyme Q10 (CO Q10) 200 MG CAPS, Take 200 mg by mouth daily at 2 PM. , Disp: , Rfl:    Magnesium 100 MG TABS, Take by mouth daily., Disp: , Rfl:    Magnesium 400 MG TABS, Take by mouth daily., Disp: , Rfl:    Misc Natural Products (PETADOLEX 50) 50 MG CAPS, Take 100 mg by mouth daily., Disp: , Rfl:    multivitamin-lutein (OCUVITE-LUTEIN) CAPS capsule, Take 1 capsule by mouth daily., Disp: , Rfl:    Omega-3 Fatty Acids (OMEGA-3 FISH OIL) 1200 MG CAPS, Take by mouth., Disp: , Rfl:    pilocarpine (SALAGEN) 5 MG tablet, Take 5 mg by mouth  2 (two) times daily., Disp: , Rfl:    polyethylene glycol (MIRALAX / GLYCOLAX) packet, Take 17 g by mouth daily. , Disp: , Rfl:    Polyvinyl Alcohol-Povidone (REFRESH OP), Place 1 drop into both eyes daily as needed (dry eyes)., Disp: , Rfl:    potassium chloride (KLOR-CON M) 10 MEQ tablet, Take 1 tablet (10 mEq total) by mouth daily as needed. Take with lasix, Disp: 90 tablet, Rfl: 3   pyridOXINE (VITAMIN B-6) 100 MG tablet, Take 100 mg by mouth daily at 2 PM., Disp: , Rfl:    Riboflavin (B2) 100 MG TABS, Take 100 mg by mouth 2 (two) times daily., Disp: , Rfl:    simvastatin (ZOCOR) 5 MG tablet, Take 1 tablet (5 mg total) by mouth daily., Disp: 90 tablet, Rfl: 2   SUPER B COMPLEX/C PO, Take 1 tablet by mouth daily at 2 PM., Disp: , Rfl:    Thiamine HCl (VITAMIN B-1) 250 MG tablet, Take 250 mg by mouth daily at 2 PM., Disp: , Rfl:    valsartan (DIOVAN) 80 MG tablet, TAKE 1 TABLET(80 MG) BY MOUTH EVERY DAY, Disp: 90 tablet, Rfl: 1   venlafaxine XR (EFFEXOR-XR) 75 MG 24 hr capsule, Take 1 capsule (75 mg total) by mouth daily with breakfast., Disp: 90 capsule, Rfl: 3   vitamin B-12 (CYANOCOBALAMIN) 1000 MCG tablet, Take 1,000 mcg by mouth daily at 2 PM., Disp: , Rfl:    vitamin C (ASCORBIC ACID) 500 MG tablet, Take 500 mg by mouth daily at 2 PM., Disp: , Rfl:    aspirin-acetaminophen-caffeine (EXCEDRIN MIGRAINE) 250-250-65 MG tablet, Take 2 tablets by mouth daily as needed for headache or migraine., Disp: , Rfl:  aspirin-sod bicarb-citric acid (ALKA-SELTZER) 325 MG TBEF tablet, Take 650 mg by mouth every 6 (six) hours as needed (indigestion)., Disp: , Rfl:    carboxymethylcellulose 1 % ophthalmic solution, 1 drop 3 (three) times daily. (Patient not taking: Reported on 12/05/2021), Disp: , Rfl:    furosemide (LASIX) 20 MG tablet, Take 1 tablet (20 mg total) by mouth daily as needed. For leg swelling or shortness of breath (Patient not taking: Reported on 12/05/2021), Disp: 90 tablet, Rfl: 3   Garlic  9201 MG CAPS, Take 6,000 mg by mouth daily as needed., Disp: , Rfl:    HYDROcodone bit-homatropine (HYDROMET) 5-1.5 MG/5ML syrup, Take 5 mLs by mouth every 4 (four) hours as needed for cough. (Patient not taking: Reported on 12/05/2021), Disp: 473 mL, Rfl: 0   ibuprofen (ADVIL,MOTRIN) 200 MG tablet, Take 400 mg by mouth daily as needed for headache or moderate pain. , Disp: , Rfl:    Lancets (ONETOUCH ULTRASOFT) lancets, Check sugar once daily DX 73.9 needs for one touch ultra 2 lancets, Disp: 50 each, Rfl: 12   meclizine (ANTIVERT) 12.5 MG tablet, One tab po tid prn for dizziness (Patient not taking: Reported on 12/05/2021), Disp: 30 tablet, Rfl: 1   methocarbamol (ROBAXIN) 500 MG tablet, Take 1 tablet (500 mg total) by mouth 3 (three) times daily. (Patient not taking: Reported on 12/05/2021), Disp: 90 tablet, Rfl: 0   omeprazole (PRILOSEC) 20 MG capsule, Take 20 mg by mouth daily as needed., Disp: , Rfl:    ONE TOUCH ULTRA TEST test strip, CHECK BLOOD SUGAR ONCE DAILY AS DIRECTED, Disp: 100 each, Rfl: 3  Vitals   Vitals:   12/05/21 0945 12/05/21 1122 12/05/21 1200 12/05/21 1411  BP:  (!) 162/82 132/81 (!) 148/84  Pulse: 60 62 62 71  Resp: 20 20 16 19   Temp:  97.9 F (36.6 C)    TempSrc:      SpO2: 95% 95% 95% 97%  Weight:      Height:         Body mass index is 29.86 kg/m.  Physical Exam   Physical Exam Gen: A&O x4, NAD Resp: CTAB, no w/r/r CV: RRR, no m/g/r; nml S1 and S2. 2+ symmetric peripheral pulses.  Neuro: *MS: A&O x4. Follows multi-step commands.  *Speech: fluid, nondysarthric, able to name and repeat *CN:    I: Deferred   II,III: PERRLA, VFF by confrontation, optic discs unable to be visualized 2/2 pupillary constriction   III,IV,VI: EOMI w/o nystagmus, no ptosis   V: Sensation intact from V1 to V3 to LT   VII: Eyelid closure was full.  Smile symmetric.   VIII: Hearing intact to voice   IX,X: Voice normal, palate elevates symmetrically    XI: SCM/trap 5/5 bilat    XII: Tongue protrudes midline, no atrophy or fasciculations  *Motor:   Normal bulk.  No tremor, rigidity or bradykinesia. Strength 5/5 throughout except giveway in LUE which nearly resolves with coaching. *Sensory: Paresthesias L forearm. No objective sensory deficit. No TTP LUE. No extinction to DSS.  *Coordination:  FNF without frank ataxia bilat *Reflexes:  2+ and symmetric throughout without clonus; toes down-going bilat *Gait: deferred  NIHSS = 2 for LUE drift and sensory deficit  Premorbid mRS = 1   Labs   CBC:  Recent Labs  Lab 12/05/21 0803  WBC 6.7  NEUTROABS 3.2  HGB 14.5  HCT 44.5  MCV 95.9  PLT 007    Basic Metabolic Panel:  Lab Results  Component  Value Date   NA 139 12/05/2021   K 4.3 12/05/2021   CO2 25 12/05/2021   GLUCOSE 113 (H) 12/05/2021   BUN 14 12/05/2021   CREATININE 0.83 12/05/2021   CALCIUM 9.7 12/05/2021   GFRNONAA >60 12/05/2021   GFRAA 74 01/14/2020   Lipid Panel:  Lab Results  Component Value Date   LDLCALC 126 (H) 08/09/2021   HgbA1c:  Lab Results  Component Value Date   HGBA1C 6.0 (H) 12/05/2021   Urine Drug Screen: No results found for: "LABOPIA", "COCAINSCRNUR", "LABBENZ", "AMPHETMU", "THCU", "LABBARB"  Alcohol Level No results found for: "ETH"   Impression   This is a 76 year old woman with past medical history significant for CAD, headache, prior history of shingles, hyperlipidemia, hypertension, prediabetes, Sjogren's syndrome and prior stroke in 1998 with residual left nasolabial fold flattening only per patient who presents after an episode of left arm weakness and numbness last night.  Her symptoms have improved significantly although she still has some mild discomfort in her left elbow and forearm which is not tender to palpation.  She states that her weakness is primarily related to the sensory abnormality in her weakness nearly resolved with coaching.  She likely had a more significant TIA last night.  Her acute  ischemic stroke is so small that I would really expected to be is asymptomatic and her exam seems to have some degree of functional overlay.  I suspect she will be close to baseline tomorrow.  It is possible that she additionally had some mild compression neuropathy that is causing her sensory symptoms which also make her feel like it is difficult to pick up or control her arm.  Recommend completion of stroke work-up below plus PT and OT.  Recommendations   - Admit for stroke workup - Permissive HTN x48 hrs from sx onset or until stroke ruled out by MRI goal BP <220/110. PRN labetalol or hydralazine if BP above these parameters. Avoid oral antihypertensives. - TTE w/ bubble - Check A1c and LDL + add statin per guidelines - ASA 57m daily + plavix 721mdaily x21 days f/b plavix 7546maily monotherapy after that - q4 hr neuro checks - STAT head CT for any change in neuro exam - Tele - PT/OT/SLP - Stroke education - Amb referral to neurology upon discharge   ______________________________________________________________________   Thank you for the opportunity to take part in the care of this patient. If you have any further questions, please contact the neurology consultation attending.  Signed,  ColSu MonksD [facility] [phone] 7pm- 7am, please page neurology on call as listed in [facility]**Any copied and pasted documentation in this note was written by me in another application not billed for and pasted by me into this document.

## 2021-12-05 NOTE — Assessment & Plan Note (Signed)
Blood pressure well controlled on admission on home amlodipine and valsartan.  Holding at this time to allow for permissive hypertension in the setting of acute CVA.  - Holding home amlodipine and valsartan

## 2021-12-05 NOTE — Plan of Care (Signed)
Education started with patient.

## 2021-12-06 DIAGNOSIS — I639 Cerebral infarction, unspecified: Secondary | ICD-10-CM | POA: Diagnosis not present

## 2021-12-06 MED ORDER — ROSUVASTATIN CALCIUM 20 MG PO TABS: 20.0000 mg | ORAL_TABLET | Freq: Every day | ORAL | 1 refills | Status: DC

## 2021-12-06 MED ORDER — OXYCODONE HCL 5 MG PO TABS
5.0000 mg | ORAL_TABLET | Freq: Once | ORAL | Status: AC
  Administered 2021-12-06: 5 mg via ORAL
  Filled 2021-12-06: qty 1

## 2021-12-06 MED ORDER — CLOPIDOGREL BISULFATE 75 MG PO TABS: 75.0000 mg | ORAL_TABLET | Freq: Every day | ORAL | 1 refills | Status: DC

## 2021-12-06 MED ORDER — ASPIRIN 81 MG PO TBEC: 81.0000 mg | DELAYED_RELEASE_TABLET | Freq: Every day | ORAL | 0 refills | Status: AC

## 2021-12-06 NOTE — Plan of Care (Signed)
  Problem: Education: Goal: Knowledge of General Education information will improve Description Including pain rating scale, medication(s)/side effects and non-pharmacologic comfort measures Outcome: Progressing   Problem: Health Behavior/Discharge Planning: Goal: Ability to manage health-related needs will improve Outcome: Progressing   

## 2021-12-06 NOTE — Progress Notes (Signed)
       CROSS COVER NOTE  NAME: DONATA REDDICK MRN: [id number] DOB : Mar 27, 1945    Date of Service   12/06/2021   HPI/Events of Note   Medication request received for 8/10 arm pain described as aching refractory to tylenol.  Interventions   Assessment/Plan:  Oxycodone     This document was prepared using Dragon voice recognition software and may include unintentional dictation errors.  Neomia Glass DNP, MBA, FNP-BC Nurse Practitioner [facility] Pager [phone]

## 2021-12-06 NOTE — Progress Notes (Signed)
Pt requesting something else for left arm pain. 8/10 aching, no other areas of pain. Reports pain is similar to  when she was admitted. Given tylenol with minimal effects. Katy Foust,NP notified, received new order for oxycodone 8m x1, will monitor for effect.

## 2021-12-06 NOTE — Progress Notes (Addendum)
Pt noted with home medications at bedside. Pt informed this nurse that she had already taken her medications prior to admission tonight. Pt informed that BP meds were now on hold and that statin med had been changed. Pt encouraged not to take medications from home while here at hospital to avoid any adverse effects. Pt declined offer to secure meds, however informed this nurse that she would place back in her belongings bag and not consume any more while hospitalized.

## 2021-12-06 NOTE — Evaluation (Signed)
Occupational Therapy Evaluation Patient Details Name: Virginia Phillips MRN: [id number] DOB: 05-26-1945 Today's Date: 12/06/2021   History of Present Illness Pt is a 76 y.o. female presenting to hospital 10/9 with c/o intermittent L arm weakness, numbness, and slight discomfort around elbow.  Imaging showing punctate acute infarct in R parietal lobe.  Pt admitted with acute CVA.  PMH includes CVA, htn, pre-diabetes, HLD, Sjobren's syndrome, DVT/PE (2016), and R ankle fx sx.  Per chart pt with chronic B LE weakness (seeing PCP for).   Clinical Impression   Patient presenting with decreased Ind in self care, balance, functional mobility/transfers, endurance, and safety awareness. Patient reports living at home with husband and using [facility] for functional mobility at baseline. She endorses being independent in self care and IADLs. Pt reporting pain in forearm but [facility] for strength and coordination. Pt reports this has been improving in the last few hours. Patient currently functioning at supervision for safety. Patient will benefit from acute OT to increase overall independence in the areas of ADLs, functional mobility, and safety awareness in order to safely discharge home with family.      Recommendations for follow up therapy are one component of a multi-disciplinary discharge planning process, led by the attending physician.  Recommendations may be updated based on patient status, additional functional criteria and insurance authorization.   Follow Up Recommendations  Home health OT    Assistance Recommended at Discharge Intermittent Supervision/Assistance  Patient can return home with the following A little help with walking and/or transfers;A little help with bathing/dressing/bathroom;Assistance with cooking/housework;Assist for transportation    Functional Status Assessment  Patient has had a recent decline in their functional status and demonstrates the ability to make significant improvements in  function in a reasonable and predictable amount of time.  Equipment Recommendations  None recommended by OT       Precautions / Restrictions Precautions Precautions: Fall Restrictions Weight Bearing Restrictions: No      Mobility Bed Mobility Overal bed mobility: Independent                  Transfers Overall transfer level: Needs assistance Equipment used: Straight cane Transfers: Sit to/from Stand Sit to Stand: Supervision                  Balance Overall balance assessment: Needs assistance Sitting-balance support: No upper extremity supported, Feet supported Sitting balance-Leahy Scale: Normal Sitting balance - Comments: steady sitting reaching outside BOS   Standing balance support: No upper extremity supported Standing balance-Leahy Scale: Good Standing balance comment: steady standing reaching within BOS                           ADL either performed or assessed with clinical judgement   ADL                                         General ADL Comments: supervision - mod I for self care tasks secondary to use of AD and increased time to complete.     Vision Baseline Vision/History: 1 Wears glasses Patient Visual Report: No change from baseline              Pertinent Vitals/Pain Pain Assessment Pain Assessment: Faces Faces Pain Scale: Hurts a little bit Pain Location: L forearm Pain Descriptors / Indicators: Radiating, Shooting Pain Intervention(s): Limited activity within  patient's tolerance, Monitored during session, Premedicated before session, Repositioned     Hand Dominance Right   Extremity/Trunk Assessment Upper Extremity Assessment Upper Extremity Assessment: Overall WFL for tasks assessed LUE Deficits / Details: L UE WFLs LUE: Unable to fully assess due to pain   Lower Extremity Assessment Lower Extremity Assessment:  (4+/5 B hip flexion; 5/5 B knee extension; 4+/5 B knee flexion; 4+/5 B DF;  intact B LE light touch, heel to shin coordination, tone, and proprioception)   Cervical / Trunk Assessment Cervical / Trunk Assessment: Normal   Communication Communication Communication: No difficulties   Cognition Arousal/Alertness: Awake/alert Behavior During Therapy: WFL for tasks assessed/performed Overall Cognitive Status: Within Functional Limits for tasks assessed                                       General Comments  bruise noted L forearm (pt reports from holding L forearm with R hand d/t L UE pain)            Home Living Family/patient expects to be discharged to:: Private residence Living Arrangements: Spouse/significant other Available Help at Discharge: Family;Available PRN/intermittently Type of Home: House Home Access: Stairs to enter [employer] of Steps: 5 Entrance Stairs-Rails: Right;Left;Can reach both Home Layout: One level     Bathroom Shower/Tub: Teacher, early years/pre: Handicapped height     Home Equipment: Rollator (4 wheels);Standard Walker;Cane - single point;Shower seat;Wheelchair - manual;Grab bars - tub/shower          Prior Functioning/Environment Prior Level of Function : Independent/Modified Independent             Mobility Comments: Ambulatory with SPC (has used rollator in last month as needed d/t LE weakness).  No recent falls reported. ADLs Comments: Ind with self care tasks        OT Problem List: Impaired balance (sitting and/or standing);Decreased strength;Pain      OT Treatment/Interventions: Self-care/ADL training;Therapeutic exercise;Therapeutic activities;Energy conservation;DME and/or AE instruction;Balance training;Patient/family education    OT Goals(Current goals can be found in the care plan section) Acute Rehab OT Goals Patient Stated Goal: to go home OT Goal Formulation: With patient Time For Goal Achievement: 12/20/21 Potential to Achieve Goals: Good ADL  Goals Pt Will Perform Grooming: with modified independence;standing Pt Will Transfer to Toilet: with modified independence;ambulating Pt Will Perform Toileting - Clothing Manipulation and hygiene: with modified independence;sit to/from stand Pt/caregiver will Perform Home Exercise Program: Increased strength;Independently;With written HEP provided;Left upper extremity;With theraputty  OT Frequency: Min 2X/week       AM-PAC OT "6 Clicks" Daily Activity     Outcome Measure Help from another person eating meals?: None Help from another person taking care of personal grooming?: None Help from another person toileting, which includes using toliet, bedpan, or urinal?: A Little Help from another person bathing (including washing, rinsing, drying)?: None Help from another person to put on and taking off regular upper body clothing?: None Help from another person to put on and taking off regular lower body clothing?: A Little 6 Click Score: 22   End of Session Equipment Utilized During Treatment: Other (comment) [facility]) Nurse Communication: Mobility status  Activity Tolerance: Patient tolerated treatment well Patient left: in bed;with call bell/phone within reach  OT Visit Diagnosis: Unsteadiness on feet (R26.81);Repeated falls (R29.6);Muscle weakness (generalized) (M62.81)  Time: 4037-5436 OT Time Calculation (min): 18 min Charges:  OT General Charges $OT Visit: 1 Visit OT Evaluation $OT Eval Low Complexity: 1 Low  Darleen Crocker, MS, OTR/L , CBIS ascom [phone]  12/06/21, 10:53 AM

## 2021-12-06 NOTE — Discharge Summary (Signed)
Physician Discharge Summary  Virginia Phillips WGY:[id number] DOB: 06/18/1945 DOA: 12/05/2021  PCP: Jonetta Osgood, NP  Admit date: 12/05/2021 Discharge date: 12/06/2021  Admitted From: Home Disposition:  Home with home health  Recommendations for Outpatient Follow-up:  Follow up with PCP in 1-2 weeks Follow-up [facility] neurology Dr. Manuella Ghazi in 2 weeks  Home Health: Yes PT OT Equipment/Devices: None  Discharge Condition: Stable CODE STATUS: Full Diet recommendation: Heart healthy  Brief/Interim Summary: 76 y.o. female with medical history significant of CVA, HTN, pre-diabetes, HLD, Sjogren's Syndrome, DVT/PE (2016) who presents to the ED with c/o left arm weakness.    Virginia Phillips states that she has been experiencing generalized fatigue over the last several days and then approximately 2 days ago, began to develop left forearm and elbow pain.  Last night, she noticed that her left forearm was weaker compared to her right in addition to her pain.  Her symptoms have been improving since she arrived to the ED.     She endorses chronic bilateral lower extremity weakness for which she has been seeing her primary care doctor but otherwise denies any other focal abnormalities at this time including headache, difficulty speaking or swallowing, vision changes, right arm weakness.  She denies any recent illness including fever, chills, congestion, cough, chest pain, shortness of breath, nausea, vomiting, diarrhea, abdominal pain.  MRI demonstrated small punctate acute infarct.  Seen by neurology in consultation.  Remainder of the work-up unrevealing and reassuring.  Symptoms nearly recovered at time of discharge.  Patient was endorsing some left arm pain.  This is also recovered.  Unclear etiology.  At time of discharge will recommend DAPT aspirin and Plavix x3 weeks followed by Plavix monotherapy indefinitely.  Statin added to home medications.  Follow-up outpatient PCP and neurology at [facility] clinic with  Dr. Manuella Ghazi.    Discharge Diagnoses:  Principal Problem:   Acute CVA (cerebrovascular accident) [facility]) Active Problems:   Hyperlipidemia   Essential hypertension   Prediabetes  * Acute CVA (cerebrovascular accident) [facility]) Patient presenting with 2-day history of left forearm pain that transitioned into weakness in the last 24 hours.  MRI with evidence of punctate acute infarct of the right parietal lobe.  There is also sequelae of severe chronic microvascular ischemic changes, so suspect this is secondary to small vessel disease.   -MRI demonstrated small punctate infarct -TTE with bubble reassuring -Stable for DC -PT aspirin Plavix x3 weeks followed by Plavix monotherapy -Start high intensity Crestor -Follow-up outpatient [facility] neurology in 2 weeks -Home health PT and OT ordered Discharge Instructions  Discharge Instructions     Diet - low sodium heart healthy   Complete by: As directed    Increase activity slowly   Complete by: As directed       Allergies as of 12/06/2021       Reactions   Linzess [linaclotide] Other (See Comments)   Pt had bad dehydration   Amoxicillin Other (See Comments)   Sleepy Did it involve swelling of the face/tongue/throat, SOB, or low BP? No Did it involve sudden or severe rash/hives, skin peeling, or any reaction on the inside of your mouth or nose? No Did you need to seek medical attention at a hospital or doctor's office? No When did it last happen?      5-10 years If all above answers are "NO", may proceed with cephalosporin use.   Blue Dyes (parenteral)    headaches   Butalbital-aspirin-caffeine Other (See Comments)   headache   Clindamycin/lincomycin  Other (See Comments)   Has blue dye    Clobetasol Other (See Comments)   unknown   Dexlansoprazole Other (See Comments)   headache   Diphenhydramine Other (See Comments)   Migraines. Tolerates dye free benadryl   Doxycycline Other (See Comments)   Unknown reaction   Esomeprazole  Other (See Comments)   Migraines    Gabapentin Other (See Comments)   unknown   Green Dye Other (See Comments)   Headaches    Iodinated Contrast Media Other (See Comments)   Unknown reaction    Meloxicam    Lightheaded    Metronidazole    Redness of the face   Nortriptyline Hcl    Migraine   Prednisolone Acetate Itching   Tetracycline Other (See Comments)   Unknown reaction   Baby Oil Rash   Cortisone Other (See Comments), Rash   Face purple, migraine    Diphenhydramine Hcl Rash, Other (See Comments)   As long as no dyes in it. SHE USES CLEAR ONLY   Levofloxacin Rash   Mineral Oil Rash   Prednisone Swelling, Rash, Other (See Comments)   Turned red RED SPLOTCHY FACE   Sulfa Antibiotics Rash        Medication List     STOP taking these medications    carboxymethylcellulose 1 % ophthalmic solution   furosemide 20 MG tablet Commonly known as: LASIX   HYDROcodone bit-homatropine 5-1.5 MG/5ML syrup Commonly known as: Hydromet   meclizine 12.5 MG tablet Commonly known as: ANTIVERT   methocarbamol 500 MG tablet Commonly known as: ROBAXIN       TAKE these medications    acetaminophen 650 MG CR tablet Commonly known as: TYLENOL Take 650 mg by mouth every 8 (eight) hours as needed for pain.   albuterol 108 (90 Base) MCG/ACT inhaler Commonly known as: VENTOLIN HFA INHALE 2 PUFFS INTO THE LUNGS EVERY 6 HOURS AS NEEDED FOR WHEEZING OR SHORTNESS OF BREATH   amLODipine 5 MG tablet Commonly known as: NORVASC TAKE 1 TABLET(5 MG) BY MOUTH DAILY   ascorbic acid 500 MG tablet Commonly known as: VITAMIN C Take 500 mg by mouth daily at 2 PM.   aspirin EC 81 MG tablet Commonly known as: Aspirin 81 Take 1 tablet (81 mg total) by mouth daily for 21 days. What changed: medication strength   aspirin-acetaminophen-caffeine 250-250-65 MG tablet Commonly known as: EXCEDRIN MIGRAINE Take 2 tablets by mouth daily as needed for headache or migraine.   aspirin-sod  bicarb-citric acid 325 MG Tbef tablet Commonly known as: ALKA-SELTZER Take 650 mg by mouth every 6 (six) hours as needed (indigestion).   B2 100 MG Tabs Take 100 mg by mouth 2 (two) times daily.   Biotin 5 MG Caps Take 5,000 mg by mouth daily at 2 PM.   BLACK CURRANT SEED OIL PO Take 1 capsule by mouth daily at 2 PM.   clopidogrel 75 MG tablet Commonly known as: PLAVIX Take 1 tablet (75 mg total) by mouth daily. Start taking on: December 07, 2021   Co Q10 200 MG Caps Take 200 mg by mouth daily at 2 PM.   cyanocobalamin 1000 MCG tablet Commonly known as: VITAMIN B12 Take 1,000 mcg by mouth daily at 2 PM.   Garlic 7062 MG Caps Take 6,000 mg by mouth daily as needed.   ibuprofen 200 MG tablet Commonly known as: ADVIL Take 400 mg by mouth daily as needed for headache or moderate pain.   Magnesium 400 MG Tabs Take by  mouth daily.   Magnesium 100 MG Tabs Take by mouth daily.   multivitamin-lutein Caps capsule Take 1 capsule by mouth daily.   Omega-3 Fish Oil 1200 MG Caps Take by mouth.   omeprazole 20 MG capsule Commonly known as: PRILOSEC Take 20 mg by mouth daily as needed.   ONE TOUCH ULTRA TEST test strip Generic drug: glucose blood CHECK BLOOD SUGAR ONCE DAILY AS DIRECTED   onetouch ultrasoft lancets Check sugar once daily DX 73.9 needs for one touch ultra 2 lancets   Petadolex 50 50 MG Caps Take 100 mg by mouth daily.   pilocarpine 5 MG tablet Commonly known as: SALAGEN Take 5 mg by mouth 2 (two) times daily.   polyethylene glycol 17 g packet Commonly known as: MIRALAX / GLYCOLAX Take 17 g by mouth daily.   potassium chloride 10 MEQ tablet Commonly known as: KLOR-CON M Take 1 tablet (10 mEq total) by mouth daily as needed. Take with lasix   pyridOXINE 100 MG tablet Commonly known as: VITAMIN B6 Take 100 mg by mouth daily at 2 PM.   REFRESH OP Place 1 drop into both eyes daily as needed (dry eyes).   rosuvastatin 20 MG tablet Commonly known  as: CRESTOR Take 1 tablet (20 mg total) by mouth daily.   SUPER B COMPLEX/C PO Take 1 tablet by mouth daily at 2 PM.   valsartan 80 MG tablet Commonly known as: DIOVAN TAKE 1 TABLET(80 MG) BY MOUTH EVERY DAY   venlafaxine XR 75 MG 24 hr capsule Commonly known as: EFFEXOR-XR Take 1 capsule (75 mg total) by mouth daily with breakfast.   vitamin B-1 250 MG tablet Take 250 mg by mouth daily at 2 PM.   Vitamin D 50 MCG (2000 UT) Caps Take 2,000 Units by mouth daily at 2 PM.        Follow-up Information     Jonetta Osgood, NP. Schedule an appointment as soon as possible for a visit in 1 week(s).   Specialty: Nurse Practitioner Contact information: [place] [address] [phone]         Minna Merritts, MD .   Specialty: Cardiology Contact information: [place] [address] [phone]         Vladimir Crofts, MD. Schedule an appointment as soon as possible for a visit in 2 week(s).   Specialty: Neurology Why: [facility].  Need follow up for stroke. Contact information: [facility] [place] [address] [phone]                Allergies  Allergen Reactions   Linzess [Linaclotide] Other (See Comments)    Pt had bad dehydration   Amoxicillin Other (See Comments)    Sleepy Did it involve swelling of the face/tongue/throat, SOB, or low BP? No Did it involve sudden or severe rash/hives, skin peeling, or any reaction on the inside of your mouth or nose? No Did you need to seek medical attention at a hospital or doctor's office? No When did it last happen?      5-10 years If all above answers are "NO", may proceed with cephalosporin use.    Blue Dyes (Parenteral)     headaches   Butalbital-Aspirin-Caffeine Other (See Comments)    headache   Clindamycin/Lincomycin Other (See Comments)    Has blue dye    Clobetasol Other (See Comments)    unknown    Dexlansoprazole Other (See Comments)  headache   Diphenhydramine Other (See Comments)    Migraines. Tolerates dye free benadryl   Doxycycline Other (See Comments)    Unknown reaction   Esomeprazole Other (See Comments)    Migraines    Gabapentin Other (See Comments)    unknown   Green Dye Other (See Comments)    Headaches    Iodinated Contrast Media Other (See Comments)    Unknown reaction    Meloxicam     Lightheaded    Metronidazole     Redness of the face   Nortriptyline Hcl     Migraine   Prednisolone Acetate Itching   Tetracycline Other (See Comments)    Unknown reaction   Baby Oil Rash   Cortisone Other (See Comments) and Rash    Face purple, migraine    Diphenhydramine Hcl Rash and Other (See Comments)    As long as no dyes in it. SHE USES CLEAR ONLY   Levofloxacin Rash   Mineral Oil Rash   Prednisone Swelling, Rash and Other (See Comments)    Turned red RED SPLOTCHY FACE   Sulfa Antibiotics Rash    Consultations: Neurology   Procedures/Studies: ECHOCARDIOGRAM COMPLETE BUBBLE STUDY  Result Date: 12/05/2021    ECHOCARDIOGRAM REPORT   Patient Name:   Virginia Phillips Date of Exam: 12/05/2021 Medical Rec #:  [id number]     Height:       66.0 in Accession #:    [id number]    Weight:       185.0 lb Date of Birth:  12-24-1945      BSA:          1.935 m Patient Age:    89 years      BP:           132/81 mmHg Patient Gender: F             HR:           64 bpm. Exam Location:  [facility] Procedure: 2D Echo, Cardiac Doppler, Color Doppler and Saline Contrast Bubble            Study Indications:     I63.9 Stroke  History:         Patient has prior history of Echocardiogram examinations, most                  recent 10/05/2020. CAD; Risk Factors:Dyslipidemia and                  Hypertension.  Sonographer:     Rosalia Hammers Referring Phys:  [id number] Patrecia Pour STACK Diagnosing Phys: Kathlyn Sacramento MD  Sonographer Comments: Suboptimal subcostal window. Image acquisition challenging due to  respiratory motion and Image acquisition challenging due to patient body habitus. IMPRESSIONS  1. Left ventricular ejection fraction, by estimation, is 55 to 60%. The left ventricle has normal function. The left ventricle has no regional wall motion abnormalities. Left ventricular diastolic parameters are consistent with Grade I diastolic dysfunction (impaired relaxation).  2. Right ventricular systolic function is normal. The right ventricular size is normal. Tricuspid regurgitation signal is inadequate for assessing PA pressure.  3. The mitral valve is normal in structure. No evidence of mitral valve regurgitation. No evidence of mitral stenosis.  4. The aortic valve is normal in structure. Aortic valve regurgitation is not visualized. No aortic stenosis is present.  5. Agitated saline contrast bubble study was positive with shunting observed within 3-6 cardiac cycles suggestive of interatrial shunt. FINDINGS  Left  Ventricle: Left ventricular ejection fraction, by estimation, is 55 to 60%. The left ventricle has normal function. The left ventricle has no regional wall motion abnormalities. The left ventricular internal cavity size was normal in size. There is  borderline left ventricular hypertrophy. Left ventricular diastolic parameters are consistent with Grade I diastolic dysfunction (impaired relaxation). Right Ventricle: The right ventricular size is normal. No increase in right ventricular wall thickness. Right ventricular systolic function is normal. Tricuspid regurgitation signal is inadequate for assessing PA pressure. Left Atrium: Left atrial size was normal in size. Right Atrium: Right atrial size was normal in size. Pericardium: There is no evidence of pericardial effusion. Mitral Valve: The mitral valve is normal in structure. No evidence of mitral valve regurgitation. No evidence of mitral valve stenosis. Tricuspid Valve: The tricuspid valve is normal in structure. Tricuspid valve regurgitation is  not demonstrated. No evidence of tricuspid stenosis. Aortic Valve: The aortic valve is normal in structure. Aortic valve regurgitation is not visualized. No aortic stenosis is present. Aortic valve mean gradient measures 3.0 mmHg. Aortic valve peak gradient measures 6.1 mmHg. Aortic valve area, by VTI measures 1.85 cm. Pulmonic Valve: The pulmonic valve was normal in structure. Pulmonic valve regurgitation is mild. No evidence of pulmonic stenosis. Aorta: The aortic root is normal in size and structure. Venous: The inferior vena cava was not well visualized. IAS/Shunts: No atrial level shunt detected by color flow Doppler. Agitated saline contrast was given intravenously to evaluate for intracardiac shunting. Agitated saline contrast bubble study was positive with shunting observed within 3-6 cardiac cycles suggestive of interatrial shunt.  LEFT VENTRICLE PLAX 2D LVIDd:         4.10 cm   Diastology LVIDs:         2.60 cm   LV e' medial:    7.40 cm/s LV PW:         0.80 cm   LV E/e' medial:  9.3 LV IVS:        1.00 cm   LV e' lateral:   8.59 cm/s LVOT diam:     2.00 cm   LV E/e' lateral: 8.0 LV SV:         46 LV SV Index:   24 LVOT Area:     3.14 cm  RIGHT VENTRICLE RV Basal diam:  3.00 cm RV Mid diam:    2.90 cm RV S prime:     11.50 cm/s TAPSE (M-mode): 1.3 cm LEFT ATRIUM             Index        RIGHT ATRIUM          Index LA diam:        3.40 cm 1.76 cm/m   RA Area:     9.26 cm LA Vol (A2C):   46.7 ml 24.14 ml/m  RA Volume:   16.50 ml 8.53 ml/m LA Vol (A4C):   26.8 ml 13.85 ml/m LA Biplane Vol: 36.5 ml 18.87 ml/m  AORTIC VALVE                    PULMONIC VALVE AV Area (Vmax):    1.70 cm     PR End Diast Vel: 4.49 msec AV Area (Vmean):   1.64 cm AV Area (VTI):     1.85 cm AV Vmax:           123.00 cm/s AV Vmean:          85.000 cm/s AV VTI:  0.248 m AV Peak Grad:      6.1 mmHg AV Mean Grad:      3.0 mmHg LVOT Vmax:         66.60 cm/s LVOT Vmean:        44.500 cm/s LVOT VTI:          0.146 m  LVOT/AV VTI ratio: 0.59  AORTA Ao Root diam: 3.10 cm MITRAL VALVE MV Area (PHT): 2.87 cm     SHUNTS MV Decel Time: 264 msec     Systemic VTI:  0.15 m MV E velocity: 68.70 cm/s   Systemic Diam: 2.00 cm MV A velocity: 114.00 cm/s MV E/A ratio:  0.60 Kathlyn Sacramento MD Electronically signed by Kathlyn Sacramento MD Signature Date/Time: 12/05/2021/4:45:38 PM    Final    MR BRAIN WO CONTRAST  Result Date: 12/05/2021 CLINICAL DATA:  Stroke suspected. EXAM: MRI HEAD WITHOUT CONTRAST MRA HEAD WITHOUT CONTRAST MRA NECK WITHOUT AND WITH CONTRAST TECHNIQUE: Multiplanar, multiecho pulse sequences of the brain and surrounding structures were obtained without intravenous contrast. Angiographic images of the Circle of Willis were obtained using MRA technique without intravenous contrast. Angiographic images of the neck were acquired using MRA technique without and with intravenous contrast. Carotid stenosis measurements (when applicable) are obtained utilizing NASCET criteria, using the distal internal carotid diameter as the denominator. COMPARISON:  None Available. FINDINGS: MRI HEAD FINDINGS Brain: There is a small punctate focus of infarct in the right parietal lobe (series 5, image 42). No hemorrhage. There is sequela of severe chronic microvascular ischemic change. No extra-axial fluid collection. No hydrocephalus. Vascular: Normal flow voids. Skull and upper cervical spine: Normal marrow signal. Sinuses/Orbits: Bilateral lens replacements. Other: None. MRA HEAD FINDINGS No evidence of intracranial occlusion or significant stenosis this noncontrast enhanced exam. There is an apparent small focal outpouching at the right A1/A-comm junction, but this is favored to be artifactual. MRA NECK FINDINGS Assessment of the origins of the branch vessels is limited due to respiratory motion artifact. Within this limitation, no evidence of high-grade stenosis of the extracranial carotid and vertebral arteries. IMPRESSION: 1. Punctate  acute infarct in the right parietal lobe. No evidence of hemorrhage. 2. No intracranial occlusion or significant stenosis in the neck. Findings were discussed with Dr. Jacqualine Code on 12/05/21 at 11:42 AM. Electronically Signed   By: Marin Roberts M.D.   On: 12/05/2021 11:49   MR ANGIO HEAD WO CONTRAST  Result Date: 12/05/2021 CLINICAL DATA:  Stroke suspected. EXAM: MRI HEAD WITHOUT CONTRAST MRA HEAD WITHOUT CONTRAST MRA NECK WITHOUT AND WITH CONTRAST TECHNIQUE: Multiplanar, multiecho pulse sequences of the brain and surrounding structures were obtained without intravenous contrast. Angiographic images of the Circle of Willis were obtained using MRA technique without intravenous contrast. Angiographic images of the neck were acquired using MRA technique without and with intravenous contrast. Carotid stenosis measurements (when applicable) are obtained utilizing NASCET criteria, using the distal internal carotid diameter as the denominator. COMPARISON:  None Available. FINDINGS: MRI HEAD FINDINGS Brain: There is a small punctate focus of infarct in the right parietal lobe (series 5, image 42). No hemorrhage. There is sequela of severe chronic microvascular ischemic change. No extra-axial fluid collection. No hydrocephalus. Vascular: Normal flow voids. Skull and upper cervical spine: Normal marrow signal. Sinuses/Orbits: Bilateral lens replacements. Other: None. MRA HEAD FINDINGS No evidence of intracranial occlusion or significant stenosis this noncontrast enhanced exam. There is an apparent small focal outpouching at the right A1/A-comm junction, but this is favored to be artifactual. MRA  NECK FINDINGS Assessment of the origins of the branch vessels is limited due to respiratory motion artifact. Within this limitation, no evidence of high-grade stenosis of the extracranial carotid and vertebral arteries. IMPRESSION: 1. Punctate acute infarct in the right parietal lobe. No evidence of hemorrhage. 2. No intracranial  occlusion or significant stenosis in the neck. Findings were discussed with Dr. Jacqualine Code on 12/05/21 at 11:42 AM. Electronically Signed   By: Marin Roberts M.D.   On: 12/05/2021 11:49   MR ANGIO NECK W WO CONTRAST  Result Date: 12/05/2021 CLINICAL DATA:  Stroke suspected. EXAM: MRI HEAD WITHOUT CONTRAST MRA HEAD WITHOUT CONTRAST MRA NECK WITHOUT AND WITH CONTRAST TECHNIQUE: Multiplanar, multiecho pulse sequences of the brain and surrounding structures were obtained without intravenous contrast. Angiographic images of the Circle of Willis were obtained using MRA technique without intravenous contrast. Angiographic images of the neck were acquired using MRA technique without and with intravenous contrast. Carotid stenosis measurements (when applicable) are obtained utilizing NASCET criteria, using the distal internal carotid diameter as the denominator. COMPARISON:  None Available. FINDINGS: MRI HEAD FINDINGS Brain: There is a small punctate focus of infarct in the right parietal lobe (series 5, image 42). No hemorrhage. There is sequela of severe chronic microvascular ischemic change. No extra-axial fluid collection. No hydrocephalus. Vascular: Normal flow voids. Skull and upper cervical spine: Normal marrow signal. Sinuses/Orbits: Bilateral lens replacements. Other: None. MRA HEAD FINDINGS No evidence of intracranial occlusion or significant stenosis this noncontrast enhanced exam. There is an apparent small focal outpouching at the right A1/A-comm junction, but this is favored to be artifactual. MRA NECK FINDINGS Assessment of the origins of the branch vessels is limited due to respiratory motion artifact. Within this limitation, no evidence of high-grade stenosis of the extracranial carotid and vertebral arteries. IMPRESSION: 1. Punctate acute infarct in the right parietal lobe. No evidence of hemorrhage. 2. No intracranial occlusion or significant stenosis in the neck. Findings were discussed with Dr. Jacqualine Code on  12/05/21 at 11:42 AM. Electronically Signed   By: Marin Roberts M.D.   On: 12/05/2021 11:49   MR Cervical Spine Wo Contrast  Result Date: 12/05/2021 CLINICAL DATA:  Provided history: Cervical radiculopathy, no red flags. Left arm weakness. Additional history provided: Patient reports right arm weakness. EXAM: MRI CERVICAL SPINE WITHOUT CONTRAST TECHNIQUE: Multiplanar, multisequence MR imaging of the cervical spine was performed. No intravenous contrast was administered. COMPARISON:  Cervical spine radiographs 01/30/2014. FINDINGS: Alignment: 2 mm C3-C4 grade 1 anterolisthesis. 3 mm C4-C5 grade 1 anterolisthesis. 2 mm C5-C6 grade 1 anterolisthesis. 3 mm C7-T1 grade 1 anterolisthesis. 2 mm T1-T2 grade 1 anterolisthesis. Slight T2-T3 grade 1 anterolisthesis. Vertebrae: Vertebral body height is maintained. Mild degenerative plate edema at P2-R5. Cord: No signal abnormality identified within the cervical spinal cord. Posterior Fossa, vertebral arteries, paraspinal tissues: No abnormality identified within included portions of the posterior fossa. Flow voids preserved within the imaged cervical vertebral arteries. No paraspinal mass or collection. Disc levels: Multilevel disc degeneration, greatest at C4-C5 (moderate), C5-C6 (moderate) and C6-C7 (moderate to advanced). C2-C3: Slight disc bulge. Facet arthrosis. No significant spinal canal or foraminal stenosis. C3-C4: Grade 1 anterolisthesis. Slight disc uncovering with slight disc bulge. Facet arthrosis (greater on the left. No significant spinal canal or degenerative foraminal stenosis. The left vertebral artery is tortuous at this level, looping within the left neural foramen and extending slightly into the spinal canal (for instance as seen on series 12, image 9). C4-C5: Grade 1 anterolisthesis. Disc uncovering. Facet arthrosis. No  significant spinal canal or foraminal stenosis. C5-C6: Grade 1 anterolisthesis. Disc uncovering with slight disc bulge. Facet arthrosis  (greater on the right). No significant spinal canal stenosis or neural foraminal narrowing. C6-C7: Posterior disc osteophyte complex with bilateral disc osteophyte ridge/uncinate hypertrophy. Facet arthrosis. Mild effacement of the ventral thecal sac (without significant spinal cord mass effect). Bilateral neural foraminal narrowing (mild right, moderate left). C7-T1: Grade 1 anterolisthesis. Disc uncovering. Facet arthrosis (greater on the right). No significant spinal canal stenosis or neural foraminal narrowing. IMPRESSION: Cervical spondylosis, as outlined. No more than mild spinal canal narrowing. Multifactorial foraminal stenosis bilaterally at C6-C7 (mild right, moderate left). Disc degeneration is greatest at C6-C7 (moderate-to-advanced with associated mild degenerative endplate edema at this level). Grade 1 anterolisthesis at C3-C4, C4-C5 C5-C6, C7-T1, T1-T2 and T2-T3. Electronically Signed   By: Kellie Simmering D.O.   On: 12/05/2021 11:33   DG Elbow Complete Left  Result Date: 12/05/2021 CLINICAL DATA:  Intermittent left arm weakness with occasional left elbow pain. No trauma. EXAM: LEFT ELBOW - COMPLETE 3+ VIEW COMPARISON:  None Available. FINDINGS: There is mild diffuse decreased bone mineralization. Very minimal degenerate changes of the left elbow. No evidence of fracture or dislocation. No significant fat pad displacement. No other soft tissue abnormality. IMPRESSION: 1. No acute findings. 2. Minimal degenerative changes. Electronically Signed   By: Marin Olp M.D.   On: 12/05/2021 09:08      Subjective: Seen and examined on the day of discharge.  Stable no distress.  Stable for discharge home.  Discharge Exam: Vitals:   12/06/21 0436 12/06/21 0735  BP: 116/69 119/78  Pulse: 70 66  Resp: 18 16  Temp: 98 F (36.7 C) (!) 97.5 F (36.4 C)  SpO2: 94% 95%   Vitals:   12/05/21 2350 12/06/21 0100 12/06/21 0436 12/06/21 0735  BP: 125/64 129/66 116/69 119/78  Pulse: 79 81 70 66  Resp:  18 17 18 16   Temp: 98 F (36.7 C) 97.9 F (36.6 C) 98 F (36.7 C) (!) 97.5 F (36.4 C)  TempSrc:    Oral  SpO2: 96% 97% 94% 95%  Weight:      Height:        General: Pt is alert, awake, not in acute distress Cardiovascular: RRR, S1/S2 +, no rubs, no gallops Respiratory: CTA bilaterally, no wheezing, no rhonchi Abdominal: Soft, NT, ND, bowel sounds + Extremities: no edema, no cyanosis    The results of significant diagnostics from this hospitalization (including imaging, microbiology, ancillary and laboratory) are listed below for reference.     Microbiology: No results found for this or any previous visit (from the past 240 hour(s)).   Labs: BNP (last 3 results) No results for input(s): "BNP" in the last 8760 hours. Basic Metabolic Panel: Recent Labs  Lab 12/05/21 0803  NA 139  K 4.3  CL 108  CO2 25  GLUCOSE 113*  BUN 14  CREATININE 0.83  CALCIUM 9.7   Liver Function Tests: Recent Labs  Lab 12/05/21 0803  AST 23  ALT 18  ALKPHOS 60  BILITOT 0.5  PROT 7.3  ALBUMIN 4.2   No results for input(s): "LIPASE", "AMYLASE" in the last 168 hours. No results for input(s): "AMMONIA" in the last 168 hours. CBC: Recent Labs  Lab 12/05/21 0803  WBC 6.7  NEUTROABS 3.2  HGB 14.5  HCT 44.5  MCV 95.9  PLT 256   Cardiac Enzymes: No results for input(s): "CKTOTAL", "CKMB", "CKMBINDEX", "TROPONINI" in the last 168 hours. BNP:  Invalid input(s): "POCBNP" CBG: No results for input(s): "GLUCAP" in the last 168 hours. D-Dimer No results for input(s): "DDIMER" in the last 72 hours. Hgb A1c Recent Labs    12/05/21 0803  HGBA1C 6.0*   Lipid Profile Recent Labs    12/05/21 0803  LDLDIRECT 107*   Thyroid function studies No results for input(s): "TSH", "T4TOTAL", "T3FREE", "THYROIDAB" in the last 72 hours.  Invalid input(s): "FREET3" Anemia work up No results for input(s): "VITAMINB12", "FOLATE", "FERRITIN", "TIBC", "IRON", "RETICCTPCT" in the last 72  hours. Urinalysis    Component Value Date/Time   COLORURINE Amber 06/17/2013 2218   APPEARANCEUR Clear 08/08/2021 1528   LABSPEC 1.019 06/17/2013 2218   PHURINE 5.0 06/17/2013 2218   GLUCOSEU 1+ (A) 08/08/2021 1528   GLUCOSEU Negative 06/17/2013 2218   HGBUR 2+ 06/17/2013 2218   BILIRUBINUR Negative 08/08/2021 1528   BILIRUBINUR Negative 06/17/2013 2218   KETONESUR Trace 06/17/2013 2218   PROTEINUR Negative 08/08/2021 1528   PROTEINUR 30 mg/dL 06/17/2013 2218   UROBILINOGEN 0.2 09/16/2020 1205   NITRITE Negative 08/08/2021 1528   NITRITE Negative 06/17/2013 2218   LEUKOCYTESUR Negative 08/08/2021 1528   LEUKOCYTESUR Trace 06/17/2013 2218   Sepsis Labs Recent Labs  Lab 12/05/21 0803  WBC 6.7   Microbiology No results found for this or any previous visit (from the past 240 hour(s)).   Time coordinating discharge: Over 30 minutes  SIGNED:   Sidney Ace, MD  [facility] 12/06/2021, 12:46 PM Pager   If 7PM-7AM, please contact night-coverage

## 2021-12-06 NOTE — Evaluation (Signed)
Physical Therapy Evaluation Patient Details Name: Virginia Phillips MRN: [id number] DOB: 1945-06-04 Today's Date: 12/06/2021  History of Present Illness  Pt is a 76 y.o. female presenting to hospital 10/9 with c/o intermittent L arm weakness, numbness, and slight discomfort around elbow.  Imaging showing punctate acute infarct in R parietal lobe.  Pt admitted with acute CVA.  PMH includes CVA, htn, pre-diabetes, HLD, Sjobren's syndrome, DVT/PE (2016), and R ankle fx sx.  Per chart pt with chronic B LE weakness (seeing PCP for).  Clinical Impression  Prior to hospital admission, pt was modified independent ambulating with SPC; lives with her husband in 1 level home with 5 STE B railings; pt reports no recent falls.  Currently pt is modified independent with bed mobility; SBA with transfers; and CGA ambulating 120 feet with SPC use.  Pt appearing mildly unsteady ambulating with SPC but no loss of balance noted (pt reports she has used her walker at home at times during this past month d/t this).  Deferred walker use d/t L upper arm and posterior L elbow pain (pt tending to keep L UE with elbow flexed against her side/abdomen).  Pt would benefit from skilled PT to address noted impairments and functional limitations (see below for any additional details).  Upon hospital discharge, pt would benefit from [facility].    Recommendations for follow up therapy are one component of a multi-disciplinary discharge planning process, led by the attending physician.  Recommendations may be updated based on patient status, additional functional criteria and insurance authorization.  Follow Up Recommendations Home health PT      Assistance Recommended at Discharge Intermittent Supervision/Assistance  Patient can return home with the following  A little help with walking and/or transfers;A little help with bathing/dressing/bathroom;Assistance with cooking/housework;Assist for transportation;Help with stairs or ramp for  entrance    Equipment Recommendations Other (comment) (pt has own [facility])  Recommendations for Other Services  OT consult    Functional Status Assessment Patient has had a recent decline in their functional status and demonstrates the ability to make significant improvements in function in a reasonable and predictable amount of time.     Precautions / Restrictions Precautions Precautions: Fall Restrictions Weight Bearing Restrictions: No      Mobility  Bed Mobility Overal bed mobility: Independent             General bed mobility comments: bed flat; mild increased effort to perform on own (d/t not using L UE)    Transfers Overall transfer level: Needs assistance Equipment used: Straight cane Transfers: Sit to/from Stand Sit to Stand: Supervision           General transfer comment: mild increased effort to stand from bed with SPC use but overall steady    Ambulation/Gait Ambulation/Gait assistance: Min guard Gait Distance (Feet): 120 Feet Assistive device: Straight cane   Gait velocity: decreased     General Gait Details: appearing mildly unsteady with SPC use but no loss of balance noted; step through gait pattern  Stairs Stairs:  (pt declined to trial stairs)          Wheelchair Mobility    Modified Rankin (Stroke Patients Only)       Balance Overall balance assessment: Needs assistance Sitting-balance support: No upper extremity supported, Feet supported Sitting balance-Leahy Scale: Normal Sitting balance - Comments: steady sitting reaching outside BOS   Standing balance support: No upper extremity supported Standing balance-Leahy Scale: Good Standing balance comment: steady standing reaching within BOS  Pertinent Vitals/Pain Pain Assessment Pain Assessment: 0-10 Pain Score: 4  (4/10 L elbow and 2/10 R elbow) Pain Location: posterior R elbow (2/10) , L upper arm, and L posterior lateral elbow  (4/10) Pain Descriptors / Indicators: Sore, Tender (L elbow pain sharp/shooting with movement) Pain Intervention(s): Limited activity within patient's tolerance, Monitored during session, Premedicated before session, Repositioned Vitals (HR and O2 on room air) stable and WFL throughout treatment session.    Home Living Family/patient expects to be discharged to:: Private residence Living Arrangements: Spouse/significant other Available Help at Discharge: Family;Available PRN/intermittently Type of Home: House Home Access: Stairs to enter Entrance Stairs-Rails: Right;Left;Can reach both Entrance Stairs-Number of Steps: 5   Home Layout: One level Home Equipment: Rollator (4 wheels);Standard Walker;Cane - single point;Shower seat;Wheelchair - manual;Grab bars - tub/shower      Prior Function Prior Level of Function : Independent/Modified Independent             Mobility Comments: Ambulatory with SPC (has used rollator in last month as needed d/t LE weakness).  No recent falls reported.       Hand Dominance        Extremity/Trunk Assessment   Upper Extremity Assessment Upper Extremity Assessment: Defer to OT evaluation;LUE deficits/detail (R UE WFL) LUE Deficits / Details: L UE limited d/t pain (pt able to perform L UE shoulder flexion AROM WFL but required R UE assist to bring back down to neutral position d/t L upper arm and posterior L elbow pain); able to extend L elbow to grossly neutral and flex L elbow to at least 100 degrees LUE: Unable to fully assess due to pain    Lower Extremity Assessment Lower Extremity Assessment:  (4+/5 B hip flexion; 5/5 B knee extension; 4+/5 B knee flexion; 4+/5 B DF; intact B LE light touch, heel to shin coordination, tone, and proprioception)    Cervical / Trunk Assessment Cervical / Trunk Assessment: Normal  Communication   Communication: No difficulties  Cognition Arousal/Alertness: Awake/alert Behavior During Therapy: WFL for  tasks assessed/performed Overall Cognitive Status: Within Functional Limits for tasks assessed                                          General Comments General comments (skin integrity, edema, etc.): bruise noted L forearm (pt reports from holding L forearm with R hand d/t L UE pain).  Pt agreeable to PT session.    Exercises     Assessment/Plan    PT Assessment Patient needs continued PT services  PT Problem List Decreased strength;Decreased balance;Decreased mobility;Decreased knowledge of precautions;Pain       PT Treatment Interventions DME instruction;Gait training;Stair training;Functional mobility training;Therapeutic activities;Therapeutic exercise;Balance training;Patient/family education    PT Goals (Current goals can be found in the Care Plan section)  Acute Rehab PT Goals Patient Stated Goal: to improve overall strength and mobility PT Goal Formulation: With patient Time For Goal Achievement: 12/20/21 Potential to Achieve Goals: Good    Frequency 7X/week     Co-evaluation               AM-PAC PT "6 Clicks" Mobility  Outcome Measure Help needed turning from your back to your side while in a flat bed without using bedrails?: None Help needed moving from lying on your back to sitting on the side of a flat bed without using bedrails?: None Help needed moving to and  from a bed to a chair (including a wheelchair)?: A Little Help needed standing up from a chair using your arms (e.g., wheelchair or bedside chair)?: A Little Help needed to walk in hospital room?: A Little Help needed climbing 3-5 steps with a railing? : A Little 6 Click Score: 20    End of Session Equipment Utilized During Treatment: Gait belt Activity Tolerance: Patient tolerated treatment well Patient left: with call bell/phone within reach (sitting on edge of bed) Nurse Communication: Precautions;Mobility status PT Visit Diagnosis: Unsteadiness on feet (R26.81);Muscle  weakness (generalized) (M62.81);Pain Pain - Right/Left: Left Pain - part of body: Arm    Time: 3533-1740 PT Time Calculation (min) (ACUTE ONLY): 25 min   Charges:   PT Evaluation $PT Eval Low Complexity: 1 Low PT Treatments $Therapeutic Activity: 8-22 mins       Leitha Bleak, PT 12/06/21, 9:44 AM

## 2021-12-06 NOTE — TOC Transition Note (Signed)
Transition of Care [facility]) - CM/SW Discharge Note   Patient Details  Name: Virginia Phillips MRN: [id number] Date of Birth: March 13, 1945  Transition of Care [facility] CM/SW Contact:  Laurena Slimmer, RN Phone Number: 12/06/2021, 1:37 PM   Clinical Narrative:    Spoke with patient regarding discharge plan and therapy recommendations. She is agreeable to [facility]. Referral sent and accepted by Corene Cornea at [facility]. Patient's husband is at her bedside and will transport her home. TOC signing off.          Patient Goals and CMS Choice        Discharge Placement                       Discharge Plan and Services                                     Social Determinants of Health (SDOH) Interventions     Readmission Risk Interventions     No data to display

## 2021-12-08 ENCOUNTER — Telehealth: Payer: Self-pay

## 2021-12-08 DIAGNOSIS — H409 Unspecified glaucoma: Secondary | ICD-10-CM | POA: Diagnosis not present

## 2021-12-08 DIAGNOSIS — Z791 Long term (current) use of non-steroidal anti-inflammatories (NSAID): Secondary | ICD-10-CM | POA: Diagnosis not present

## 2021-12-08 DIAGNOSIS — Z7982 Long term (current) use of aspirin: Secondary | ICD-10-CM | POA: Diagnosis not present

## 2021-12-08 DIAGNOSIS — K219 Gastro-esophageal reflux disease without esophagitis: Secondary | ICD-10-CM | POA: Diagnosis not present

## 2021-12-08 DIAGNOSIS — I10 Essential (primary) hypertension: Secondary | ICD-10-CM | POA: Diagnosis not present

## 2021-12-08 DIAGNOSIS — R41841 Cognitive communication deficit: Secondary | ICD-10-CM | POA: Diagnosis not present

## 2021-12-08 DIAGNOSIS — G43909 Migraine, unspecified, not intractable, without status migrainosus: Secondary | ICD-10-CM | POA: Diagnosis not present

## 2021-12-08 DIAGNOSIS — I69311 Memory deficit following cerebral infarction: Secondary | ICD-10-CM | POA: Diagnosis not present

## 2021-12-08 DIAGNOSIS — Z9181 History of falling: Secondary | ICD-10-CM | POA: Diagnosis not present

## 2021-12-08 DIAGNOSIS — I2510 Atherosclerotic heart disease of native coronary artery without angina pectoris: Secondary | ICD-10-CM | POA: Diagnosis not present

## 2021-12-08 DIAGNOSIS — I69354 Hemiplegia and hemiparesis following cerebral infarction affecting left non-dominant side: Secondary | ICD-10-CM | POA: Diagnosis not present

## 2021-12-08 DIAGNOSIS — M3500 Sicca syndrome, unspecified: Secondary | ICD-10-CM | POA: Diagnosis not present

## 2021-12-08 DIAGNOSIS — E785 Hyperlipidemia, unspecified: Secondary | ICD-10-CM | POA: Diagnosis not present

## 2021-12-08 DIAGNOSIS — Z86718 Personal history of other venous thrombosis and embolism: Secondary | ICD-10-CM | POA: Diagnosis not present

## 2021-12-08 DIAGNOSIS — R7303 Prediabetes: Secondary | ICD-10-CM | POA: Diagnosis not present

## 2021-12-08 DIAGNOSIS — Z86711 Personal history of pulmonary embolism: Secondary | ICD-10-CM | POA: Diagnosis not present

## 2021-12-08 DIAGNOSIS — Z7902 Long term (current) use of antithrombotics/antiplatelets: Secondary | ICD-10-CM | POA: Diagnosis not present

## 2021-12-08 NOTE — Telephone Encounter (Signed)
Lmom to [facility] [phone] that 1 times a week for 5 weeks

## 2021-12-09 ENCOUNTER — Telehealth: Payer: Self-pay

## 2021-12-09 NOTE — Telephone Encounter (Signed)
Lmom and gave verbal order [phone] for 1 times a week for 5 weeks

## 2021-12-15 DIAGNOSIS — I69354 Hemiplegia and hemiparesis following cerebral infarction affecting left non-dominant side: Secondary | ICD-10-CM | POA: Diagnosis not present

## 2021-12-15 DIAGNOSIS — R7303 Prediabetes: Secondary | ICD-10-CM | POA: Diagnosis not present

## 2021-12-15 DIAGNOSIS — I2510 Atherosclerotic heart disease of native coronary artery without angina pectoris: Secondary | ICD-10-CM | POA: Diagnosis not present

## 2021-12-15 DIAGNOSIS — K219 Gastro-esophageal reflux disease without esophagitis: Secondary | ICD-10-CM | POA: Diagnosis not present

## 2021-12-15 DIAGNOSIS — E785 Hyperlipidemia, unspecified: Secondary | ICD-10-CM | POA: Diagnosis not present

## 2021-12-15 DIAGNOSIS — I10 Essential (primary) hypertension: Secondary | ICD-10-CM | POA: Diagnosis not present

## 2021-12-16 ENCOUNTER — Telehealth: Payer: Self-pay

## 2021-12-16 NOTE — Telephone Encounter (Signed)
[facility] called that pt declined for Occupational therapy [phone]

## 2021-12-20 ENCOUNTER — Telehealth: Payer: Self-pay

## 2021-12-20 ENCOUNTER — Encounter: Payer: Self-pay | Admitting: Medical

## 2021-12-20 ENCOUNTER — Ambulatory Visit: Payer: Medicare Other | Attending: Medical | Admitting: Medical

## 2021-12-20 VITALS — BP 110/70 | HR 83 | Ht 66.0 in | Wt 189.4 lb

## 2021-12-20 DIAGNOSIS — I639 Cerebral infarction, unspecified: Secondary | ICD-10-CM

## 2021-12-20 DIAGNOSIS — I2510 Atherosclerotic heart disease of native coronary artery without angina pectoris: Secondary | ICD-10-CM

## 2021-12-20 DIAGNOSIS — E782 Mixed hyperlipidemia: Secondary | ICD-10-CM | POA: Diagnosis not present

## 2021-12-20 DIAGNOSIS — I739 Peripheral vascular disease, unspecified: Secondary | ICD-10-CM

## 2021-12-20 DIAGNOSIS — M79661 Pain in right lower leg: Secondary | ICD-10-CM | POA: Diagnosis not present

## 2021-12-20 DIAGNOSIS — M79662 Pain in left lower leg: Secondary | ICD-10-CM

## 2021-12-20 DIAGNOSIS — R002 Palpitations: Secondary | ICD-10-CM | POA: Diagnosis not present

## 2021-12-20 DIAGNOSIS — Q2112 Patent foramen ovale: Secondary | ICD-10-CM | POA: Diagnosis not present

## 2021-12-20 DIAGNOSIS — I6389 Other cerebral infarction: Secondary | ICD-10-CM

## 2021-12-20 NOTE — Progress Notes (Signed)
Cardiology Office Note:    Date:  12/20/2021   ID:  Virginia Phillips 01-Jun-1945, MRN [id number]  PCP:  Jonetta Osgood, NP  [facility] Cardiologist:  Ida Rogue, MD  [facility] Electrophysiologist:  None   Referring MD: Jonetta Osgood, NP   Chief Complaint: Hospital follow-up  History of Present Illness:    Virginia Phillips is a 76 y.o. 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   She was previously followed by Dr. Nehemiah Massed, though transitioned to Dr. Rockey Situ in 07/2020.  Prior echo at [facility] in 03/2018 demonstrated an EF greater than 55%, normal wall motion, mild LVH, normal RV systolic function, and trivial mitral/tricuspid regurgitation.  She has previously undergone carotid artery ultrasound in 12/2019, though results are unavailable for review.   She established care with Dr. Rockey Situ on 08/12/2020 for a second opinion regarding episodes of chest discomfort that would wake her from sleep and took several hours to improve with stuttering chest pain afterwards.  She did not feel like symptoms were related to her prior hiatal hernia.  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  Given symptoms, she underwent coronary CTA on 08/26/2020 showed a calcium score of 231 which was the 76 percentile.  There was minimal nonobstructive CAD.  Incidentally, there was left to right intra-arterial communication noted.  Given calcium score, pravastatin was titrated to 20 mg.     She notified our office in 08/2020 she felt like she was off balance following titration of pravastatin from 10 mg to 20 mg.  She was subsequently transition to  rosuvastatin 5 mg.   Echo with bubble study on 10/05/2020 demonstrated an EF of 60 to 65%, mild LVH, grade 1 diastolic dysfunction, normal RV systolic function and ventricular cavity size, no significant valvular abnormalities, and estimated right atrial pressure of 3 mmHg, and agitated saline contrast bubble study was negative without evidence of any intra-arterial shunting.  Last seen 03/2021 reporting atypical chest pain.  She reported palpitations and heart monitor was ordered.  Heart monitor showed predominantly normal sinus rhythm, 7 runs of SVT, the run with the fastest interval lasting 18 beats with a max rate of 160 was also the longest. Isolated ectopy.  Patient was recently admitted in October 2023 for acute stroke.  She presented with left arm weakness.  MRI showed small punctate acute infarct.  Neurology saw the patient.  Many of the work-up was unrevealing and reassuring. Echo bubble study showed LVEF 55-60%, G1DD, positive with shunting observed within 3-6 cardiac cycles suggestive of interatiral shunt. Patient was discharged on DAPT with aspirin and Plavix x3 weeks followed by Plavix monotherapy indefinitely. Statin was added to home medications.  Today, the patient reports recent stroke, she presented with left arm discomfort. She denies chest pain or shortness of breath. she reports generalized joint and body pain.  She reports bilateral leg pain with walking and at rest. Lower extremity pulses good on exam. Echo showed possible interatrial shunt, discussed with ID.  Plan to continue aspirin and Plavix as already prescribed.  Patient denies palpitations, lower leg edema, orthopnea, PND.  Blood pressure and heart rate good today.  Past Medical History:  Diagnosis Date   Coronary  artery disease    GERD (gastroesophageal reflux disease)    Glaucoma    Headache    migraines   History of shingles    Hyperlipidemia    Hypertension    Pre-diabetes    Pulmonary embolism on right [facility])  2007   right lung   Sjogren's syndrome ([place])    Stroke ([name]) 1998    Past Surgical History:  Procedure Laterality Date   ANKLE FRACTURE SURGERY Right 2007   COLONOSCOPY WITH PROPOFOL N/A 05/10/2017   Procedure: COLONOSCOPY WITH PROPOFOL;  Surgeon: [name], MD;  Location: [facility] ENDOSCOPY;  Service: Endoscopy;  Laterality: N/A;   ESOPHAGOGASTRODUODENOSCOPY (EGD) WITH PROPOFOL N/A 10/17/2017   Procedure: ESOPHAGOGASTRODUODENOSCOPY (EGD) WITH PROPOFOL;  Surgeon: [name], MD;  Location: [facility] ENDOSCOPY;  Service: Endoscopy;  Laterality: N/A;   GANGLION CYST EXCISION Right    wrist   HERNIA REPAIR     2011   KNEE ARTHROSCOPY W/ ACL RECONSTRUCTION Right 2008   KNEE SURGERY  08/05/2007   LASIK     NISSEN FUNDOPLICATION  4008    Current Medications: Current Meds  Medication Sig   acetaminophen (TYLENOL) 650 MG CR tablet Take 650 mg by mouth every 8 (eight) hours as needed for pain.   albuterol (VENTOLIN HFA) 108 (90 Base) MCG/ACT inhaler INHALE 2 PUFFS INTO THE LUNGS EVERY 6 HOURS AS NEEDED FOR WHEEZING OR SHORTNESS OF BREATH   amLODipine (NORVASC) 5 MG tablet TAKE 1 TABLET(5 MG) BY MOUTH DAILY   aspirin EC (ASPIRIN 81) 81 MG tablet Take 1 tablet (81 mg total) by mouth daily for 21 days.   aspirin-acetaminophen-caffeine (EXCEDRIN MIGRAINE) 250-250-65 MG tablet Take 2 tablets by mouth daily as needed for headache or migraine.   aspirin-sod bicarb-citric acid (ALKA-SELTZER) 325 MG TBEF tablet Take 650 mg by mouth every 6 (six) hours as needed (indigestion).   Biotin 5 MG CAPS Take 5,000 mg by mouth daily at 2 PM.   BLACK CURRANT SEED OIL PO Take 1 capsule by mouth daily at 2 PM.   Cholecalciferol (VITAMIN D) 2000 UNITS CAPS Take 2,000 Units by mouth daily at 2 PM.    clopidogrel (PLAVIX) 75 MG tablet Take 1 tablet (75 mg total) by mouth daily.   Coenzyme Q10 (CO Q10) 200 MG CAPS Take 200 mg by mouth daily at 2 PM.    Garlic 6761 MG CAPS Take 6,000 mg by mouth daily as  needed.   ibuprofen (ADVIL,MOTRIN) 200 MG tablet Take 400 mg by mouth daily as needed for headache or moderate pain.    Lancets (ONETOUCH ULTRASOFT) lancets Check sugar once daily DX 73.9 needs for one touch ultra 2 lancets   Magnesium 100 MG TABS Take by mouth daily.   Magnesium 400 MG TABS Take by mouth daily.   Misc Natural Products (PETADOLEX 50) 50 MG CAPS Take 100 mg by mouth daily.   multivitamin-lutein (OCUVITE-LUTEIN) CAPS capsule Take 1 capsule by mouth daily.   Omega-3 Fatty Acids (OMEGA-3 FISH OIL) 1200 MG CAPS Take by mouth.   omeprazole (PRILOSEC) 20 MG capsule Take 20 mg by mouth daily as needed.   ONE TOUCH ULTRA TEST test strip CHECK BLOOD SUGAR ONCE DAILY AS DIRECTED   pilocarpine (SALAGEN) 5 MG tablet Take 5 mg by mouth 2 (two) times daily.   polyethylene glycol (MIRALAX / GLYCOLAX) packet Take 17 g by mouth daily.    Polyvinyl Alcohol-Povidone (REFRESH OP) Place 1 drop into both eyes daily as needed (dry eyes).  potassium chloride (KLOR-CON M) 10 MEQ tablet Take 1 tablet (10 mEq total) by mouth daily as needed. Take with lasix   pyridOXINE (VITAMIN B-6) 100 MG tablet Take 100 mg by mouth daily at 2 PM.   Riboflavin (B2) 100 MG TABS Take 100 mg by mouth 2 (two) times daily.   rosuvastatin (CRESTOR) 20 MG tablet Take 1 tablet (20 mg total) by mouth daily.   SUPER B COMPLEX/C PO Take 1 tablet by mouth daily at 2 PM.   Thiamine HCl (VITAMIN B-1) 250 MG tablet Take 250 mg by mouth daily at 2 PM.   valsartan (DIOVAN) 80 MG tablet TAKE 1 TABLET(80 MG) BY MOUTH EVERY DAY   venlafaxine XR (EFFEXOR-XR) 75 MG 24 hr capsule Take 1 capsule (75 mg total) by mouth daily with breakfast.   vitamin B-12 (CYANOCOBALAMIN) 1000 MCG tablet Take 1,000 mcg by mouth daily at 2 PM.   vitamin C (ASCORBIC ACID) 500 MG tablet Take 500 mg by mouth daily at 2 PM.     Allergies:   Linzess [linaclotide], Amoxicillin, Blue dyes (parenteral), Butalbital-aspirin-caffeine, Clindamycin/lincomycin,  Clobetasol, Dexlansoprazole, Diphenhydramine, Doxycycline, Esomeprazole, Gabapentin, Green dye, Iodinated contrast media, Meloxicam, Metronidazole, Nortriptyline hcl, Prednisolone acetate, Tetracycline, Baby oil, Cortisone, Diphenhydramine hcl, Levofloxacin, Mineral oil, Prednisone, and Sulfa antibiotics   Social History   Socioeconomic History   Marital status: Married    Spouse name: Not on file   Number of children: 1   Years of education: Not on file   Highest education level: Some college, no degree  Occupational History   Occupation: retired  Tobacco Use   Smoking status: Never   Smokeless tobacco: Never  Vaping Use   Vaping Use: Never used  Substance and Sexual Activity   Alcohol use: No    Alcohol/week: 0.0 standard drinks of alcohol   Drug use: No   Sexual activity: Not on file  Other Topics Concern   Not on file  Social History Narrative   Not on file   Social Determinants of Health   Financial Resource Strain: Low Risk  (06/14/2017)   Overall Financial Resource Strain (CARDIA)    Difficulty of Paying Living Expenses: Not hard at all  Food Insecurity: No Food Insecurity (12/05/2021)   Hunger Vital Sign    Worried About Running Out of Food in the Last Year: Never true    Ran Out of Food in the Last Year: Never true  Transportation Needs: No Transportation Needs (12/05/2021)   PRAPARE - Hydrologist (Medical): No    Lack of Transportation (Non-Medical): No  Physical Activity: Not on file  Stress: No Stress Concern Present (06/14/2017)   [place]    Feeling of Stress : Only a little  Social Connections: Not on file     Family History: The patient's family history includes Cancer in her brother; Congestive Heart Failure in her mother; Diabetes in her father; Parkinson's disease in her mother; Scleroderma in her sister.  ROS:   Please see the history of present illness.      All other systems reviewed and are negative.  EKGs/Labs/Other Studies Reviewed:    The following studies were reviewed today:  Echo 12/05/21 1. Left ventricular ejection fraction, by estimation, is 55 to 60%. The  left ventricle has normal function. The left ventricle has no regional  wall motion abnormalities. Left ventricular diastolic parameters are  consistent with Grade I diastolic  dysfunction (impaired relaxation).  2. Right ventricular systolic function is normal. The right ventricular  size is normal. Tricuspid regurgitation signal is inadequate for assessing  PA pressure.   3. The mitral valve is normal in structure. No evidence of mitral valve  regurgitation. No evidence of mitral stenosis.   4. The aortic valve is normal in structure. Aortic valve regurgitation is  not visualized. No aortic stenosis is present.   5. Agitated saline contrast bubble study was positive with shunting  observed within 3-6 cardiac cycles suggestive of interatrial shunt.   Heart monitor 04/2021 Normal sinus rhythm Patient had a min HR of 52 bpm, max HR of 160 bpm, and avg HR of 78 bpm.    7 Supraventricular Tachycardia runs occurred, the run with the fastest interval lasting 18 beats with a max rate of 160 bpm (avg 142  bpm); the run with the fastest interval was also the longest.   Isolated SVEs were rare (<1.0%), SVE Couplets were rare (<1.0%), and SVE Triplets were rare (<1.0%).  Isolated VEs were rare (<1.0%), VE Couplets were rare (<1.0%), and no VE Triplets were  present.    1 patient triggered event associated with sinus rhythm    Cardiac CTA 07/2020 IMPRESSION: 1. Coronary calcium score of 231. This was 76th percentile for age, sex, and race matched control.   2. Normal coronary origin with right dominance.   3. CAD-RADS 1. Minimal non-obstructive CAD (1-24%). Consider non-atherosclerotic causes of chest pain. Consider preventive therapy and risk factor modification.   4.  There is a 4.9 X 3.5 X 2.2 mm left to right interatrial communication. Cannot exclude PFO.    10/05/20 1. Left ventricular ejection fraction, by estimation, is 60 to 65%. The  left ventricle has normal function. Left ventricular endocardial border  not optimally defined to evaluate regional wall motion. There is mild left  ventricular hypertrophy. Left  ventricular diastolic parameters are consistent with Grade I diastolic  dysfunction (impaired relaxation).   2. Right ventricular systolic function is normal. The right ventricular  size is normal.   3. The mitral valve is grossly normal. No evidence of mitral valve  regurgitation. No evidence of mitral stenosis.   4. The aortic valve is normal in structure. Aortic valve regurgitation is  not visualized. No aortic stenosis is present.   5. The inferior vena cava is normal in size with greater than 50%  respiratory variability, suggesting right atrial pressure of 3 mmHg.   6. Agitated saline contrast bubble study was negative, with no evidence  of any interatrial shunt.   EKG:  EKG is ordered today.  The ekg ordered today demonstrates NSR 83bpm, LAD, nonspecific T wave changes  Recent Labs: 08/09/2021: TSH 1.540 12/05/2021: ALT 18; BUN 14; Creatinine, Ser 0.83; Hemoglobin 14.5; Platelets 256; Potassium 4.3; Sodium 139  Recent Lipid Panel    Component Value Date/Time   CHOL 213 (H) 08/09/2021 1546   CHOL 166 03/06/2012 1512   TRIG 150 (H) 08/09/2021 1546   TRIG 93 03/06/2012 1512   HDL 61 08/09/2021 1546   HDL 76 (H) 03/06/2012 1512   CHOLHDL 3.5 08/09/2021 1546   CHOLHDL 2.9 12/14/2016 1339   VLDL 19 03/06/2012 1512   LDLCALC 126 (H) 08/09/2021 1546   LDLCALC 96 12/14/2016 1339   LDLCALC 71 03/06/2012 1512   LDLDIRECT 107 (H) 12/05/2021 0803    Physical Exam:    VS:  BP 110/70 (BP Location: Left Arm, Patient Position: Sitting, Cuff Size: Normal)   Pulse 83  Ht 5' 6"  (1.676 m)   Wt 189 lb 6 oz (85.9 kg)   SpO2 95%   BMI  30.57 kg/m     Wt Readings from Last 3 Encounters:  12/20/21 189 lb 6 oz (85.9 kg)  12/05/21 185 lb (83.9 kg)  08/08/21 187 lb (84.8 kg)     GEN:  Well nourished, well developed in no acute distress HEENT: Normal NECK: No JVD; No carotid bruits LYMPHATICS: No lymphadenopathy CARDIAC: RRR, no murmurs, rubs, gallops RESPIRATORY:  Clear to auscultation without rales, wheezing or rhonchi  ABDOMEN: Soft, non-tender, non-distended MUSCULOSKELETAL:  No edema; No deformity  SKIN: Warm and dry NEUROLOGIC:  Alert and oriented x 3 PSYCHIATRIC:  Normal affect   ASSESSMENT:    1. Cerebrovascular accident (CVA), unspecified mechanism ([place])   2. Palpitations   3. Claudication ([place])   4. Coronary artery disease, non-occlusive   5. Hyperlipidemia, mixed   6. Pain in both lower legs   7. PFO (patent foramen ovale)    PLAN:    In order of problems listed above:  Acute stroke Recent admission for acute stroke. She presented with left forearm and elbow pain. MRI showed small punctate acute infarct, seen by neurology.  She was discharged with DAPT with aspirin and Plavix for 3 weeks, followed by Plavix monotherapy.  No residual weakness, but has some joint and generalized body pain. No history of Afib on heart monitor earlier this year.  EKG shows normal sinus rhythm 83 bpm.  I will re-check a 2-week heart monitor.  Nonobstructive CAD Patient denies chest pain. Cardiac CTA in 08/16/2020 showed a coronary calcium score of 231, 63 percentile for age and sex, minimal nonobstructive CAD.  HFpEF Patient is euvolemic on exam. Patient is not on a standing diuretic. Recent echo showed normal LVEF with grade 1 diastolic dysfunction. Continue valsartan.  HLD Direct LDL 107 12/05/2021. She was changed to Crestor 20 mg daily. Can recheck lipid panel in 4 to 6 weeks.  Bilateral lower leg pain Patient reports bilateral leg pain with exertion and at rest. Lower extremity pulses are normal. I will order  bilateral ABIs  PFO Seen on echo bubble study 12/05/2021.  Discussed with Dr. Rockey Situ.  Plan to continue with aspirin and Plavix, as previously prescribed.  Palpitations Heart monitor 05/16/2021 showed predominantly normal sinus rhythm, 7 runs of SVT, fastest interval 18 beats, which was also the longest, rare ectopy.  1 patient triggered event with normal sinus rhythm.  No A-fib was noted.  Disposition: Follow up in 3 month(s) with MD   Signed, Kayron Hicklin Ninfa Meeker, PA-C  12/20/2021 12:51 PM    [facility]

## 2021-12-20 NOTE — Telephone Encounter (Signed)
Called patient to make her aware that Cadence Furth, [place] would like for her to wear a monitor.   No answer. LMOV to call back.

## 2021-12-20 NOTE — Patient Instructions (Addendum)
Medication Instructions:   Your physician recommends that you continue on your current medications as directed. Please refer to the Current Medication list given to you today.  *If you need a refill on your cardiac medications before your next appointment, please call your pharmacy*   Lab Work:  None Ordered  If you have labs (blood work) drawn today and your tests are completely normal, you will receive your results only by: [place] (if you have MyChart) OR A paper copy in the mail If you have any lab test that is abnormal or we need to change your treatment, we will call you to review the results.   Testing/Procedures:  Your physician has requested that you have an ankle brachial index (ABI). During this test an ultrasound and blood pressure cuff are used to evaluate the arteries that supply the arms and legs with blood. Allow thirty minutes for this exam. There are no restrictions or special instructions. Your physician has requested that you have a lower extremity arterial exercise duplex. During this test, exercise and ultrasound are used to evaluate arterial blood flow in the legs. Allow one hour for this exam. There are no restrictions or special instructions.   Follow-Up: At [facility], you and your health needs are our priority.  As part of our continuing mission to provide you with exceptional heart care, we have created designated Provider Care Teams.  These Care Teams include your primary Cardiologist (physician) and Advanced Practice Providers (APPs -  Physician Assistants and Nurse Practitioners) who all work together to provide you with the care you need, when you need it.  We recommend signing up for the patient portal called "MyChart".  Sign up information is provided on this After Visit Summary.  MyChart is used to connect with patients for Virtual Visits (Telemedicine).  Patients are able to view lab/test results, encounter notes, upcoming appointments,  etc.  Non-urgent messages can be sent to your provider as well.   To learn more about what you can do with MyChart, go to [email or web address].    Your next appointment:   3 month(s)  The format for your next appointment:   In Person  Provider:   You may see Ida Rogue, MD

## 2021-12-21 DIAGNOSIS — Q2112 Patent foramen ovale: Secondary | ICD-10-CM | POA: Diagnosis not present

## 2021-12-21 DIAGNOSIS — Z8673 Personal history of transient ischemic attack (TIA), and cerebral infarction without residual deficits: Secondary | ICD-10-CM | POA: Diagnosis not present

## 2021-12-21 DIAGNOSIS — R29898 Other symptoms and signs involving the musculoskeletal system: Secondary | ICD-10-CM | POA: Diagnosis not present

## 2021-12-21 DIAGNOSIS — R519 Headache, unspecified: Secondary | ICD-10-CM | POA: Diagnosis not present

## 2021-12-22 DIAGNOSIS — Z23 Encounter for immunization: Secondary | ICD-10-CM | POA: Diagnosis not present

## 2021-12-27 NOTE — Telephone Encounter (Signed)
Patient returned RN's call.  Patient stated she would wear a monitor.  Patient stated she is having headaches related to [place] shot she had recently.

## 2021-12-28 ENCOUNTER — Ambulatory Visit: Payer: Medicare Other | Attending: Medical

## 2021-12-28 DIAGNOSIS — I639 Cerebral infarction, unspecified: Secondary | ICD-10-CM

## 2021-12-28 DIAGNOSIS — I6389 Other cerebral infarction: Secondary | ICD-10-CM

## 2021-12-28 NOTE — Telephone Encounter (Signed)
Per Cadence Furth's OV note on 12/20/21, she would like for patient to wear a 2 week Zio monitor.    Called patient back and informed her one would be mailed to her. She remembers wearing one back in March of this year. Reviewed instructions on wearing and sending it back in. Patient was grateful for the call back.

## 2021-12-28 NOTE — Addendum Note (Signed)
Addended by: Kavin Leech on: 12/28/2021 09:13 AM   Modules accepted: Orders

## 2022-01-01 DIAGNOSIS — I6389 Other cerebral infarction: Secondary | ICD-10-CM | POA: Diagnosis not present

## 2022-01-01 DIAGNOSIS — I639 Cerebral infarction, unspecified: Secondary | ICD-10-CM | POA: Diagnosis not present

## 2022-01-04 ENCOUNTER — Other Ambulatory Visit: Payer: Self-pay | Admitting: Nurse Practitioner

## 2022-01-04 DIAGNOSIS — N959 Unspecified menopausal and perimenopausal disorder: Secondary | ICD-10-CM

## 2022-01-10 ENCOUNTER — Telehealth: Payer: Self-pay | Admitting: Nurse Practitioner

## 2022-01-10 NOTE — Telephone Encounter (Signed)
12/08/21 order faxed back to  [facility]; [phone]

## 2022-01-10 NOTE — Telephone Encounter (Signed)
Received 12/08/21 homehealth order from [facility]. Gave to Alyssa for signature-Toni

## 2022-01-17 ENCOUNTER — Ambulatory Visit: Payer: Medicare Other | Attending: Medical

## 2022-01-17 DIAGNOSIS — M6281 Muscle weakness (generalized): Secondary | ICD-10-CM | POA: Diagnosis not present

## 2022-01-17 DIAGNOSIS — I739 Peripheral vascular disease, unspecified: Secondary | ICD-10-CM | POA: Diagnosis not present

## 2022-01-17 DIAGNOSIS — R002 Palpitations: Secondary | ICD-10-CM | POA: Insufficient documentation

## 2022-01-25 ENCOUNTER — Telehealth: Payer: Self-pay | Admitting: Cardiovascular Disease

## 2022-01-25 ENCOUNTER — Telehealth: Payer: Self-pay | Admitting: Nurse Practitioner

## 2022-01-25 MED ORDER — CLOPIDOGREL BISULFATE 75 MG PO TABS: 75.0000 mg | ORAL_TABLET | Freq: Every day | ORAL | 0 refills | Status: AC

## 2022-01-25 NOTE — Telephone Encounter (Signed)
12/15/21 order faxed back to  [facility]; [phone]

## 2022-01-25 NOTE — Telephone Encounter (Signed)
 *  STAT* If patient is at the pharmacy, call can be transferred to refill team.   1. Which medications need to be refilled? (please list name of each medication and dose if known)   clopidogrel (PLAVIX) 75 MG tablet    2. Which pharmacy/location (including street and city if local pharmacy) is medication to be sent to? [facility] [place], [place]   3. Do they need a 30 day or 90 day supply? 90 days

## 2022-01-25 NOTE — Telephone Encounter (Signed)
Requested Prescriptions   Signed Prescriptions Disp Refills   clopidogrel (PLAVIX) 75 MG tablet 90 tablet 0    Sig: Take 1 tablet (75 mg total) by mouth daily.    Authorizing Provider: Minna Merritts    Ordering User: Raelene Bott, Azzure Garabedian L

## 2022-01-30 DIAGNOSIS — I6389 Other cerebral infarction: Secondary | ICD-10-CM | POA: Diagnosis not present

## 2022-01-30 DIAGNOSIS — I639 Cerebral infarction, unspecified: Secondary | ICD-10-CM | POA: Diagnosis not present

## 2022-01-31 ENCOUNTER — Telehealth: Payer: Self-pay

## 2022-01-31 ENCOUNTER — Other Ambulatory Visit: Payer: Self-pay

## 2022-01-31 DIAGNOSIS — N959 Unspecified menopausal and perimenopausal disorder: Secondary | ICD-10-CM

## 2022-01-31 MED ORDER — VENLAFAXINE HCL ER 75 MG PO CP24: ORAL_CAPSULE | ORAL | 0 refills | Status: DC

## 2022-01-31 MED ORDER — VALSARTAN 80 MG PO TABS: ORAL_TABLET | ORAL | 1 refills | Status: DC

## 2022-01-31 MED ORDER — ROSUVASTATIN CALCIUM 20 MG PO TABS: 20.0000 mg | ORAL_TABLET | Freq: Every day | ORAL | 1 refills | Status: DC

## 2022-01-31 NOTE — Telephone Encounter (Signed)
Pt called for venlafaxine XR refills as per alyssa send 90 days and advised pt need follow up appt

## 2022-01-31 NOTE — Telephone Encounter (Signed)
Pt called that she need refills for venlafaxine to [facility]

## 2022-02-07 ENCOUNTER — Other Ambulatory Visit: Payer: Self-pay

## 2022-02-07 DIAGNOSIS — N959 Unspecified menopausal and perimenopausal disorder: Secondary | ICD-10-CM

## 2022-02-07 MED ORDER — VENLAFAXINE HCL ER 75 MG PO CP24: ORAL_CAPSULE | ORAL | 0 refills | Status: DC

## 2022-03-06 DIAGNOSIS — H401111 Primary open-angle glaucoma, right eye, mild stage: Secondary | ICD-10-CM | POA: Diagnosis not present

## 2022-03-06 DIAGNOSIS — H04123 Dry eye syndrome of bilateral lacrimal glands: Secondary | ICD-10-CM | POA: Diagnosis not present

## 2022-03-06 DIAGNOSIS — H401123 Primary open-angle glaucoma, left eye, severe stage: Secondary | ICD-10-CM | POA: Diagnosis not present

## 2022-03-25 NOTE — Progress Notes (Unsigned)
Cardiology Office Note   Date:  03/27/2022   ID:  Shaya, Reddick 1945-03-05, MRN [id number]  PCP:  Jonetta Osgood, NP  Cardiologist:   Ida Rogue, MD   Chief Complaint  Patient presents with   3 month follow up     Patient c/o shortness of breath with over exertion, balance is off, chest pain earlier today and would like to change her cholesterol medication from Crestor to Vytorin. Medications reviewed by the patient verbally.       History of Present Illness: Virginia Phillips is a 77 y.o. female who presents for  Nonsmoker, second hand 3 ppd Borderline diabetes Hypertension Ankle surgery, debility Obstructive sleep apnea Carotid disease, aortic atherosclerosis per outside cardiology records not available  Nonobstructive CAD on CT scan 6/22 but presenting for f/u of her chest pain symptoms, palpitations  Last seen by myself in clinic February 2023  Sedentary, no regular exercise program, walks in the house Has an exercise bike but does not use it Sinus pain > 1 week Doing netty pot Requesting a Z-Pak for sinus infection, has worked for her in the past  Having leg cramps, thinks it is crstor Wants vytorin, husband reports it was the best one for him  Not on plavix, feels it causes dizziness Started taking asa 325  Issues with constipation Miralex/ MOM, prune juice  Bothered by hammertoes   admitted in October 2023 for acute stroke.  left arm weakness.   MRI showed small punctate acute infarct.   work-up was unrevealing  Echo bubble study showed LVEF 55-60%, G1DD, positive with shunting observed  discharged on DAPT with aspirin and Plavix x3 weeks followed by Plavix monotherapy indefinitely.  Statin was added to home medications.   Zio monitor December 2023, no significant arrhythmia Rare short runs narrow complex tachycardia/SVT  Lab work reviewed A1c 6.0 LDL 107 Creatinine  0.8 BUN 14 Hemoglobin 14.5  Cardiac CTA,  Nonobstructive disease noted,  calcium score 260  EKG personally reviewed by myself on todays visit Normal sinus rhythm rate 73 bpm no significant ST-T wave changes  Other past medical history reviewed Echo   1. Left ventricular ejection fraction, by estimation, is 60 to 65%. The  left ventricle has normal function. Left ventricular endocardial border  not optimally defined to evaluate regional wall motion. There is mild left  ventricular hypertrophy. Left  ventricular diastolic parameters are consistent with Grade I diastolic  dysfunction (impaired relaxation).   2. Right ventricular systolic function is normal. The right ventricular  size is normal.   Severe chest pain,  08/09/2020,    Past Medical History:  Diagnosis Date   Coronary artery disease    GERD (gastroesophageal reflux disease)    Glaucoma    Headache    migraines   History of shingles    Hyperlipidemia    Hypertension    Pre-diabetes    Pulmonary embolism on right [facility]) 2007   right lung   Sjogren's syndrome ([place])    Stroke ([place]) 1998    Past Surgical History:  Procedure Laterality Date   ANKLE FRACTURE SURGERY Right 2007   COLONOSCOPY WITH PROPOFOL N/A 05/10/2017   Procedure: COLONOSCOPY WITH PROPOFOL;  Surgeon: [name], MD;  Location: [facility] ENDOSCOPY;  Service: Endoscopy;  Laterality: N/A;   ESOPHAGOGASTRODUODENOSCOPY (EGD) WITH PROPOFOL N/A 10/17/2017   Procedure: ESOPHAGOGASTRODUODENOSCOPY (EGD) WITH PROPOFOL;  Surgeon: [name], MD;  Location: [facility] ENDOSCOPY;  Service: Endoscopy;  Laterality: N/A;   GANGLION  CYST EXCISION Right    wrist   HERNIA REPAIR     2011   KNEE ARTHROSCOPY W/ ACL RECONSTRUCTION Right 2008   KNEE SURGERY  08/05/2007   LASIK     NISSEN FUNDOPLICATION  6045     Current Outpatient Medications  Medication Sig Dispense Refill   acetaminophen (TYLENOL) 650 MG CR tablet Take 650 mg by mouth every 8 (eight) hours as needed for pain.     albuterol (VENTOLIN HFA) 108 (90 Base) MCG/ACT  inhaler INHALE 2 PUFFS INTO THE LUNGS EVERY 6 HOURS AS NEEDED FOR WHEEZING OR SHORTNESS OF BREATH 20.1 g 1   amLODipine (NORVASC) 5 MG tablet TAKE 1 TABLET(5 MG) BY MOUTH DAILY 90 tablet 2   aspirin 325 MG tablet Take 325 mg by mouth daily.     aspirin-acetaminophen-caffeine (EXCEDRIN MIGRAINE) 250-250-65 MG tablet Take 2 tablets by mouth daily as needed for headache or migraine.     aspirin-sod bicarb-citric acid (ALKA-SELTZER) 325 MG TBEF tablet Take 650 mg by mouth every 6 (six) hours as needed (indigestion).     Biotin 5 MG CAPS Take 5,000 mg by mouth daily at 2 PM.     BLACK CURRANT SEED OIL PO Take 1 capsule by mouth daily at 2 PM.     Cholecalciferol (VITAMIN D) 2000 UNITS CAPS Take 2,000 Units by mouth daily at 2 PM.      Coenzyme Q10 (CO Q10) 200 MG CAPS Take 200 mg by mouth daily at 2 PM.      Garlic 4098 MG CAPS Take 6,000 mg by mouth daily as needed.     ibuprofen (ADVIL,MOTRIN) 200 MG tablet Take 400 mg by mouth daily as needed for headache or moderate pain.      Lancets (ONETOUCH ULTRASOFT) lancets Check sugar once daily DX 73.9 needs for one touch ultra 2 lancets 50 each 12   Magnesium 400 MG TABS Take by mouth daily.     Misc Natural Products (PETADOLEX 50) 50 MG CAPS Take 100 mg by mouth daily.     multivitamin-lutein (OCUVITE-LUTEIN) CAPS capsule Take 1 capsule by mouth daily.     Omega-3 Fatty Acids (OMEGA-3 FISH OIL) 1200 MG CAPS Take by mouth.     ONE TOUCH ULTRA TEST test strip CHECK BLOOD SUGAR ONCE DAILY AS DIRECTED 100 each 3   pilocarpine (SALAGEN) 5 MG tablet Take 5 mg by mouth 2 (two) times daily.     polyethylene glycol (MIRALAX / GLYCOLAX) packet Take 17 g by mouth daily.      Polyvinyl Alcohol-Povidone (REFRESH OP) Place 1 drop into both eyes daily as needed (dry eyes).     potassium chloride (KLOR-CON M) 10 MEQ tablet Take 1 tablet (10 mEq total) by mouth daily as needed. Take with lasix 90 tablet 3   pyridOXINE (VITAMIN B-6) 100 MG tablet Take 100 mg by mouth  daily at 2 PM.     Riboflavin (B2) 100 MG TABS Take 100 mg by mouth 2 (two) times daily.     rosuvastatin (CRESTOR) 20 MG tablet Take 1 tablet (20 mg total) by mouth daily. 90 tablet 1   Thiamine HCl (VITAMIN B-1) 250 MG tablet Take 250 mg by mouth daily at 2 PM.     valsartan (DIOVAN) 80 MG tablet TAKE 1 TABLET(80 MG) BY MOUTH EVERY DAY 90 tablet 1   venlafaxine XR (EFFEXOR-XR) 75 MG 24 hr capsule TAKE 1 CAPSULE(75 MG) BY MOUTH DAILY AT 2 PM 90 capsule 0  vitamin B-12 (CYANOCOBALAMIN) 1000 MCG tablet Take 1,000 mcg by mouth daily at 2 PM.     vitamin C (ASCORBIC ACID) 500 MG tablet Take 500 mg by mouth daily at 2 PM.     No current facility-administered medications for this visit.    Allergies:   Linzess [linaclotide], Amoxicillin, Blue dyes (parenteral), Butalbital-aspirin-caffeine, Clindamycin/lincomycin, Clobetasol, Dexlansoprazole, Diphenhydramine, Doxycycline, Esomeprazole, Gabapentin, Green dye, Iodinated contrast media, Meloxicam, Metronidazole, Nortriptyline hcl, Prednisolone acetate, Tetracycline, Baby oil, Cortisone, Diphenhydramine hcl, Levofloxacin, Mineral oil, Prednisone, and Sulfa antibiotics    Social History:  The patient  reports that she has never smoked. She has never used smokeless tobacco. She reports that she does not drink alcohol and does not use drugs.   Family History:  The patient's family history includes Cancer in her brother; Congestive Heart Failure in her mother; Diabetes in her father; Parkinson's disease in her mother; Scleroderma in her sister.    ROS:   Review of Systems  Constitutional: Negative.   HENT: Negative.    Respiratory: Negative.    Cardiovascular:  Positive for palpitations and leg swelling.  Gastrointestinal: Negative.   Musculoskeletal: Negative.   Neurological: Negative.   Psychiatric/Behavioral: Negative.    All other systems reviewed and are negative.    PHYSICAL EXAM: VS:  BP 100/78 (BP Location: Left Arm, Patient Position:  Sitting, Cuff Size: Normal)   Pulse 73   Ht '5\' 7"'$  (1.702 m)   Wt 186 lb 6 oz (84.5 kg)   SpO2 96%   BMI 29.19 kg/m  , BMI Body mass index is 29.19 kg/m. Constitutional:  oriented to person, place, and time. No distress.  HENT:  Head: Grossly normal Eyes:  no discharge. No scleral icterus.  Neck: No JVD, no carotid bruits  Cardiovascular: Regular rate and rhythm, no murmurs appreciated Pulmonary/Chest: Clear to auscultation bilaterally, no wheezes or rails Abdominal: Soft.  no distension.  no tenderness.  Musculoskeletal: Normal range of motion Neurological:  normal muscle tone. Coordination normal. No atrophy Skin: Skin warm and dry Psychiatric: normal affect, pleasant  Recent Labs: 08/09/2021: TSH 1.540 12/05/2021: ALT 18; BUN 14; Creatinine, Ser 0.83; Hemoglobin 14.5; Platelets 256; Potassium 4.3; Sodium 139    Lipid Panel    Component Value Date/Time   CHOL 213 (H) 08/09/2021 1546   CHOL 166 03/06/2012 1512   TRIG 150 (H) 08/09/2021 1546   TRIG 93 03/06/2012 1512   HDL 61 08/09/2021 1546   HDL 76 (H) 03/06/2012 1512   CHOLHDL 3.5 08/09/2021 1546   CHOLHDL 2.9 12/14/2016 1339   VLDL 19 03/06/2012 1512   LDLCALC 126 (H) 08/09/2021 1546   LDLCALC 96 12/14/2016 1339   LDLCALC 71 03/06/2012 1512   LDLDIRECT 107 (H) 12/05/2021 0803      Wt Readings from Last 3 Encounters:  03/27/22 186 lb 6 oz (84.5 kg)  12/20/21 189 lb 6 oz (85.9 kg)  12/05/21 185 lb (83.9 kg)         08/12/2020   11:34 AM  PAD Screen  Previous PAD dx? No  Previous surgical procedure? No  Pain with walking? Yes  Subsides with rest? Yes  Feet/toe relief with dangling? No  Painful, non-healing ulcers? No  Extremities discolored? No    ASSESSMENT AND PLAN:  Chest pain/angina Cardiac CTA showing nonobstructive disease Echocardiogram normal ejection fraction No further ischemic workup needed  Essential hypertension Blood pressure low on today's visit, recommend she monitor blood pressure  at home and call us if systolic continues to run  less than 110  Leg swelling Denies leg swelling on today's visit Off lasix,  Lots of soda drinking, water  Hyperlipidemia At her request we will stop the Crestor as she reports leg cramping Will change to simvastatin 20 with Zetia as she would like to be on Vytorin  Coronary disease Nonobstructive disease on cardiac CTA Recommend aspirin 81 but she prefers to be on 325 given prior history of stroke Does not want to be on Plavix, feels this caused lightheadedness  Paroxysmal tachycardia Denies any symptoms  Sinus pressure Requesting Z-Pak   Total encounter time more than 40 minutes  Greater than 50% was spent in counseling and coordination of care with the patient   Signed,  Ida Rogue, MD  03/27/2022 2:08 PM    [place]

## 2022-03-27 ENCOUNTER — Ambulatory Visit: Payer: Medicare Other | Attending: Cardiovascular Disease | Admitting: Cardiovascular Disease

## 2022-03-27 ENCOUNTER — Encounter: Payer: Self-pay | Admitting: Cardiovascular Disease

## 2022-03-27 VITALS — BP 100/78 | HR 73 | Ht 67.0 in | Wt 186.4 lb

## 2022-03-27 DIAGNOSIS — E782 Mixed hyperlipidemia: Secondary | ICD-10-CM | POA: Insufficient documentation

## 2022-03-27 DIAGNOSIS — I639 Cerebral infarction, unspecified: Secondary | ICD-10-CM | POA: Insufficient documentation

## 2022-03-27 DIAGNOSIS — I10 Essential (primary) hypertension: Secondary | ICD-10-CM | POA: Insufficient documentation

## 2022-03-27 DIAGNOSIS — I479 Paroxysmal tachycardia, unspecified: Secondary | ICD-10-CM | POA: Insufficient documentation

## 2022-03-27 DIAGNOSIS — I2510 Atherosclerotic heart disease of native coronary artery without angina pectoris: Secondary | ICD-10-CM | POA: Insufficient documentation

## 2022-03-27 DIAGNOSIS — R7303 Prediabetes: Secondary | ICD-10-CM | POA: Diagnosis not present

## 2022-03-27 DIAGNOSIS — Q2112 Patent foramen ovale: Secondary | ICD-10-CM | POA: Diagnosis not present

## 2022-03-27 MED ORDER — VALSARTAN 80 MG PO TABS: ORAL_TABLET | ORAL | 2 refills | Status: DC

## 2022-03-27 MED ORDER — EZETIMIBE 10 MG PO TABS: 10.0000 mg | ORAL_TABLET | Freq: Every day | ORAL | 2 refills | Status: DC

## 2022-03-27 MED ORDER — AZITHROMYCIN 250 MG PO TABS: ORAL_TABLET | ORAL | 0 refills | Status: DC

## 2022-03-27 MED ORDER — SIMVASTATIN 20 MG PO TABS: 20.0000 mg | ORAL_TABLET | Freq: Every day | ORAL | 2 refills | Status: DC

## 2022-03-27 NOTE — Patient Instructions (Addendum)
Monitor for low blood pressure at home (top number <110)  Medication Instructions:  Zpak 6 days  Hold crestor Please start simvastatin 20 mg daily and zetia 10 mg daily  Add omeprazole daily  If you need a refill on your cardiac medications before your next appointment, please call your pharmacy.   Lab work: No new labs needed  Testing/Procedures: No new testing needed  Follow-Up: At [facility], you and your health needs are our priority.  As part of our continuing mission to provide you with exceptional heart care, we have created designated Provider Care Teams.  These Care Teams include your primary Cardiologist (physician) and Advanced Practice Providers (APPs -  Physician Assistants and Nurse Practitioners) who all work together to provide you with the care you need, when you need it.  You will need a follow up appointment in 12 months  Providers on your designated Care Team:   Murray Hodgkins, NP Christell Faith, PA-C Cadence Kathlen Mody, [place]  COVID-19 Vaccine Information can be found at: [email or web address] For questions related to vaccine distribution or appointments, please email [email or web address] or call [phone].

## 2022-03-29 ENCOUNTER — Telehealth: Payer: Self-pay | Admitting: Cardiovascular Disease

## 2022-03-29 NOTE — Telephone Encounter (Signed)
Spoke with patient and reviewed all cardiac medications that she is currently taking. She confirmed understanding of this with no further questions at this time.

## 2022-03-29 NOTE — Telephone Encounter (Signed)
Patient was calling in regards to her medication that Dr. Rockey Situ prescribed to her on Monday. Would like for Dr. Rockey Situ or nurse to call back to discuss.

## 2022-04-03 ENCOUNTER — Other Ambulatory Visit: Payer: Self-pay | Admitting: Cardiovascular Disease

## 2022-04-03 DIAGNOSIS — I10 Essential (primary) hypertension: Secondary | ICD-10-CM

## 2022-04-05 ENCOUNTER — Encounter: Payer: Self-pay | Admitting: Nurse Practitioner

## 2022-04-05 ENCOUNTER — Encounter: Payer: Self-pay | Admitting: Oncology

## 2022-04-05 ENCOUNTER — Ambulatory Visit (INDEPENDENT_AMBULATORY_CARE_PROVIDER_SITE_OTHER): Payer: Medicare Other | Admitting: Nurse Practitioner

## 2022-04-05 VITALS — BP 140/77 | HR 77 | Temp 97.2°F | Resp 16 | Ht 67.0 in | Wt 188.8 lb

## 2022-04-05 DIAGNOSIS — E538 Deficiency of other specified B group vitamins: Secondary | ICD-10-CM | POA: Diagnosis not present

## 2022-04-05 DIAGNOSIS — N6321 Unspecified lump in the left breast, upper outer quadrant: Secondary | ICD-10-CM | POA: Diagnosis not present

## 2022-04-05 DIAGNOSIS — R7303 Prediabetes: Secondary | ICD-10-CM

## 2022-04-05 DIAGNOSIS — E782 Mixed hyperlipidemia: Secondary | ICD-10-CM

## 2022-04-05 DIAGNOSIS — N644 Mastodynia: Secondary | ICD-10-CM

## 2022-04-05 DIAGNOSIS — K581 Irritable bowel syndrome with constipation: Secondary | ICD-10-CM | POA: Diagnosis not present

## 2022-04-05 DIAGNOSIS — R252 Cramp and spasm: Secondary | ICD-10-CM | POA: Diagnosis not present

## 2022-04-05 MED ORDER — LUBIPROSTONE 8 MCG PO CAPS: 8.0000 ug | ORAL_CAPSULE | Freq: Two times a day (BID) | ORAL | 2 refills | Status: DC

## 2022-04-05 NOTE — Progress Notes (Signed)
[facility] [place], [place] [address]  Internal MEDICINE  Office Visit Note  Patient Name: Virginia Phillips  [id number]  [id number]  Date of Service: 04/05/2022  Chief Complaint  Patient presents with   Acute Visit    Left breast pain x 1 week      HPI Virginia Phillips presents for an acute sick visit for Left breast pain  --unsure when the breast pain started. Not sure if there is a lump.  Recent stroke in October 2023. Since stroke, has been having leg cramps  Has history of IBS with constipation and has not found something that works consistently to help with the constipation  Would like to have some labs drawn as well.     Current Medication:  Outpatient Encounter Medications as of 04/05/2022  Medication Sig   acetaminophen (TYLENOL) 650 MG CR tablet Take 650 mg by mouth every 8 (eight) hours as needed for pain.   albuterol (VENTOLIN HFA) 108 (90 Base) MCG/ACT inhaler INHALE 2 PUFFS INTO THE LUNGS EVERY 6 HOURS AS NEEDED FOR WHEEZING OR SHORTNESS OF BREATH   amLODipine (NORVASC) 5 MG tablet TAKE 1 TABLET(5 MG) BY MOUTH DAILY   aspirin 325 MG tablet Take 325 mg by mouth daily.   aspirin-acetaminophen-caffeine (EXCEDRIN MIGRAINE) 250-250-65 MG tablet Take 2 tablets by mouth daily as needed for headache or migraine.   aspirin-sod bicarb-citric acid (ALKA-SELTZER) 325 MG TBEF tablet Take 650 mg by mouth every 6 (six) hours as needed (indigestion).   azithromycin (ZITHROMAX Z-PAK) 250 MG tablet 2 tabs on day one then 1 tab daily thereafter   Biotin 5 MG CAPS Take 5,000 mg by mouth daily at 2 PM.   BLACK CURRANT SEED OIL PO Take 1 capsule by mouth daily at 2 PM.   Cholecalciferol (VITAMIN D) 2000 UNITS CAPS Take 2,000 Units by mouth daily at 2 PM.    Coenzyme Q10 (CO Q10) 200 MG CAPS Take 200 mg by mouth daily at 2 PM.    ezetimibe (ZETIA) 10 MG tablet Take 1 tablet (10 mg total) by mouth daily.   Garlic 123XX123 MG CAPS Take 6,000 mg by mouth daily as needed.    ibuprofen (ADVIL,MOTRIN) 200 MG tablet Take 400 mg by mouth daily as needed for headache or moderate pain.    Lancets (ONETOUCH ULTRASOFT) lancets Check sugar once daily DX 73.9 needs for one touch ultra 2 lancets   lubiprostone (AMITIZA) 8 MCG capsule Take 1 capsule (8 mcg total) by mouth 2 (two) times daily with a meal.   Magnesium 400 MG TABS Take by mouth daily.   Misc Natural Products (PETADOLEX 50) 50 MG CAPS Take 100 mg by mouth daily.   multivitamin-lutein (OCUVITE-LUTEIN) CAPS capsule Take 1 capsule by mouth daily.   Omega-3 Fatty Acids (OMEGA-3 FISH OIL) 1200 MG CAPS Take by mouth.   ONE TOUCH ULTRA TEST test strip CHECK BLOOD SUGAR ONCE DAILY AS DIRECTED   pilocarpine (SALAGEN) 5 MG tablet Take 5 mg by mouth 2 (two) times daily.   polyethylene glycol (MIRALAX / GLYCOLAX) packet Take 17 g by mouth daily.    Polyvinyl Alcohol-Povidone (REFRESH OP) Place 1 drop into both eyes daily as needed (dry eyes).   potassium chloride (KLOR-CON M) 10 MEQ tablet Take 1 tablet (10 mEq total) by mouth daily as needed. Take with lasix   pyridOXINE (VITAMIN B-6) 100 MG tablet Take 100 mg by mouth daily at 2 PM.   Riboflavin (B2) 100 MG TABS Take  100 mg by mouth 2 (two) times daily.   simvastatin (ZOCOR) 20 MG tablet Take 1 tablet (20 mg total) by mouth at bedtime.   Thiamine HCl (VITAMIN B-1) 250 MG tablet Take 250 mg by mouth daily at 2 PM.   valsartan (DIOVAN) 80 MG tablet TAKE 1 TABLET(80 MG) BY MOUTH EVERY DAY   venlafaxine XR (EFFEXOR-XR) 75 MG 24 hr capsule TAKE 1 CAPSULE(75 MG) BY MOUTH DAILY AT 2 PM   vitamin B-12 (CYANOCOBALAMIN) 1000 MCG tablet Take 1,000 mcg by mouth daily at 2 PM.   vitamin C (ASCORBIC ACID) 500 MG tablet Take 500 mg by mouth daily at 2 PM.   No facility-administered encounter medications on file as of 04/05/2022.      Medical History: Past Medical History:  Diagnosis Date   Coronary artery disease    GERD (gastroesophageal reflux disease)    Glaucoma    Headache     migraines   History of shingles    Hyperlipidemia    Hypertension    Pre-diabetes    Pulmonary embolism on right [facility]) 2007   right lung   Sjogren's syndrome ([facility])    Stroke ([facility]) 1998     Vital Signs: BP (!) 140/77   Pulse 77   Temp (!) 97.2 F (36.2 C)   Resp 16   Ht 5' 7"$  (1.702 m)   Wt 188 lb 12.8 oz (85.6 kg)   SpO2 93%   BMI 29.57 kg/m    Review of Systems  Constitutional:  Negative for chills, fatigue and fever.  HENT: Negative.    Respiratory: Negative.  Negative for cough, chest tightness, shortness of breath and wheezing.   Cardiovascular:  Negative for chest pain (left breast pain) and palpitations.  Gastrointestinal:  Positive for constipation. Negative for abdominal pain.  Genitourinary: Negative.   Musculoskeletal:  Positive for back pain (chronic) and myalgias (leg cramps).    Physical Exam Vitals reviewed.  Constitutional:      General: She is not in acute distress.    Appearance: Normal appearance. She is not ill-appearing.  HENT:     Head: Normocephalic and atraumatic.  Eyes:     Pupils: Pupils are equal, round, and reactive to light.  Cardiovascular:     Rate and Rhythm: Normal rate and regular rhythm.  Pulmonary:     Effort: Pulmonary effort is normal. No respiratory distress.  Chest:  Breasts:    Breasts are symmetrical.     Right: Normal. No swelling, bleeding, mass or tenderness.     Left: Mass (2 O'clock position, upper outer quadrant) and tenderness present. No swelling or bleeding.    Lymphadenopathy:     Upper Body:     Right upper body: No supraclavicular, axillary or pectoral adenopathy.     Left upper body: No supraclavicular, axillary or pectoral adenopathy.  Neurological:     Mental Status: She is alert and oriented to person, place, and time.  Psychiatric:        Mood and Affect: Mood normal.        Behavior: Behavior normal.       Assessment/Plan: 1. Pain of left breast Diagnostic mammogram and ultrasound  ordered - MM DIAG BREAST TOMO BILATERAL; Future - US BREAST LTD UNI LEFT INC AXILLA; Future  2. Breast lump on left side at 2 o'clock position Diagnostic mammogram and ultrasound ordered - MM DIAG BREAST TOMO BILATERAL; Future - US BREAST LTD UNI LEFT INC AXILLA; Future  3. Leg cramps Labs  ordered for further evaluation, follow up in 2 weeks  - Magnesium - B12 and Folate Panel - CBC with Differential/Platelet - CMP14+EGFR - Hgb A1C w/o eAG - Iron, TIBC and Ferritin Panel  4. Irritable bowel syndrome with constipation Amitiza prescribed, follow up in 2 weeks - lubiprostone (AMITIZA) 8 MCG capsule; Take 1 capsule (8 mcg total) by mouth 2 (two) times daily with a meal.  Dispense: 60 capsule; Refill: 2  5. B12 deficiency Routine labs ordered - Magnesium - B12 and Folate Panel - CBC with Differential/Platelet - Iron, TIBC and Ferritin Panel  6. Mixed hyperlipidemia Routine lab ordered - Lipid Profile  7. Prediabetes Labs ordered for further evaluation - Magnesium - CMP14+EGFR   General Counseling: Shyia verbalizes understanding of the findings of todays visit and agrees with plan of treatment. I have discussed any further diagnostic evaluation that may be needed or ordered today. We also reviewed her medications today. she has been encouraged to call the office with any questions or concerns that should arise related to todays visit.    Counseling:    Orders Placed This Encounter  Procedures   MM DIAG BREAST TOMO BILATERAL   US BREAST LTD UNI LEFT INC AXILLA   Magnesium   B12 and Folate Panel   CBC with Differential/Platelet   CMP14+EGFR   Hgb A1C w/o eAG   Iron, TIBC and Ferritin Panel   Lipid Profile    Meds ordered this encounter  Medications   lubiprostone (AMITIZA) 8 MCG capsule    Sig: Take 1 capsule (8 mcg total) by mouth 2 (two) times daily with a meal.    Dispense:  60 capsule    Refill:  2    Fill new script    Return in about 2 weeks (around  04/19/2022) for F/U, Labs, Kamica Florance PCP.  [place] Controlled Substance Database was reviewed by me for overdose risk score (ORS)  Time spent:20 Minutes Time spent with patient included reviewing progress notes, labs, imaging studies, and discussing plan for follow up.   This patient was seen by Jonetta Osgood, FNP-C in collaboration with Dr. Clayborn Bigness as a part of collaborative care agreement.  Bryann Gentz R. Valetta Fuller, MSN, FNP-C Internal Medicine

## 2022-04-06 DIAGNOSIS — R252 Cramp and spasm: Secondary | ICD-10-CM | POA: Diagnosis not present

## 2022-04-06 DIAGNOSIS — R7303 Prediabetes: Secondary | ICD-10-CM | POA: Diagnosis not present

## 2022-04-06 DIAGNOSIS — E782 Mixed hyperlipidemia: Secondary | ICD-10-CM | POA: Diagnosis not present

## 2022-04-06 DIAGNOSIS — E538 Deficiency of other specified B group vitamins: Secondary | ICD-10-CM | POA: Diagnosis not present

## 2022-04-07 LAB — B12 AND FOLATE PANEL
Folate: 11.8 ng/mL (ref >3.0)
Vitamin B-12: 883 pg/mL (ref 232–1245)

## 2022-04-07 LAB — CMP14+EGFR
ALT: 15 IU/L (ref 0–32)
AST: 22 IU/L (ref 0–40)
Albumin/Globulin Ratio: 1.9 (ref 1.2–2.2)
Albumin: 4.6 g/dL (ref 3.8–4.8)
Alkaline Phosphatase: 58 IU/L (ref 44–121)
BUN/Creatinine Ratio: 13 (ref 12–28)
BUN: 12 mg/dL (ref 8–27)
Bilirubin Total: 0.3 mg/dL (ref 0.0–1.2)
CO2: 25 mmol/L (ref 20–29)
Calcium: 9.6 mg/dL (ref 8.7–10.3)
Chloride: 102 mmol/L (ref 96–106)
Creatinine, Ser: 0.92 mg/dL (ref 0.57–1.00)
Globulin, Total: 2.4 g/dL (ref 1.5–4.5)
Glucose: 128 mg/dL — ABNORMAL HIGH (ref 70–99)
Potassium: 4.7 mmol/L (ref 3.5–5.2)
Sodium: 140 mmol/L (ref 134–144)
Total Protein: 7 g/dL (ref 6.0–8.5)
eGFR: 65 mL/min/{1.73_m2} (ref >59)

## 2022-04-07 LAB — CBC WITH DIFFERENTIAL/PLATELET
Basophils Absolute: 0.1 10*3/uL (ref 0.0–0.2)
Basos: 1 %
EOS (ABSOLUTE): 0.2 10*3/uL (ref 0.0–0.4)
Eos: 3 %
Hematocrit: 42.5 % (ref 34.0–46.6)
Hemoglobin: 14.2 g/dL (ref 11.1–15.9)
Immature Grans (Abs): 0 10*3/uL (ref 0.0–0.1)
Immature Granulocytes: 0 %
Lymphocytes Absolute: 1.8 10*3/uL (ref 0.7–3.1)
Lymphs: 34 %
MCH: 30.8 pg (ref 26.6–33.0)
MCHC: 33.4 g/dL (ref 31.5–35.7)
MCV: 92 fL (ref 79–97)
Monocytes Absolute: 0.5 10*3/uL (ref 0.1–0.9)
Monocytes: 8 %
Neutrophils Absolute: 2.8 10*3/uL (ref 1.4–7.0)
Neutrophils: 54 %
Platelets: 233 10*3/uL (ref 150–450)
RBC: 4.61 x10E6/uL (ref 3.77–5.28)
RDW: 12.1 % (ref 11.7–15.4)
WBC: 5.4 10*3/uL (ref 3.4–10.8)

## 2022-04-07 LAB — IRON,TIBC AND FERRITIN PANEL
Ferritin: 40 ng/mL (ref 15–150)
Iron Saturation: 51 % (ref 15–55)
Iron: 141 ug/dL — ABNORMAL HIGH (ref 27–139)
Total Iron Binding Capacity: 275 ug/dL (ref 250–450)
UIBC: 134 ug/dL (ref 118–369)

## 2022-04-07 LAB — MAGNESIUM: Magnesium: 2.2 mg/dL (ref 1.6–2.3)

## 2022-04-07 LAB — LIPID PANEL
Chol/HDL Ratio: 2.6 ratio (ref 0.0–4.4)
Cholesterol, Total: 163 mg/dL (ref 100–199)
HDL: 62 mg/dL (ref >39)
LDL Chol Calc (NIH): 70 mg/dL (ref 0–99)
Triglycerides: 189 mg/dL — ABNORMAL HIGH (ref 0–149)
VLDL Cholesterol Cal: 31 mg/dL (ref 5–40)

## 2022-04-07 LAB — HGB A1C W/O EAG: Hgb A1c MFr Bld: 6.2 % — ABNORMAL HIGH (ref 4.8–5.6)

## 2022-04-15 ENCOUNTER — Encounter: Payer: Self-pay | Admitting: Nurse Practitioner

## 2022-04-18 ENCOUNTER — Inpatient Hospital Stay
Admission: RE | Admit: 2022-04-18 | Discharge: 2022-04-18 | Disposition: A | Discharge status: Home / Self Care | Payer: Self-pay | Source: Ambulatory Visit | Attending: *Deleted | Admitting: *Deleted

## 2022-04-18 ENCOUNTER — Other Ambulatory Visit: Payer: Self-pay | Admitting: *Deleted

## 2022-04-18 DIAGNOSIS — Z1231 Encounter for screening mammogram for malignant neoplasm of breast: Secondary | ICD-10-CM

## 2022-04-19 ENCOUNTER — Ambulatory Visit (INDEPENDENT_AMBULATORY_CARE_PROVIDER_SITE_OTHER): Payer: Medicare Other | Admitting: Nurse Practitioner

## 2022-04-19 ENCOUNTER — Encounter: Payer: Self-pay | Admitting: Nurse Practitioner

## 2022-04-19 VITALS — BP 143/65 | HR 90 | Temp 97.4°F | Resp 16 | Ht 67.0 in | Wt 187.8 lb

## 2022-04-19 DIAGNOSIS — E782 Mixed hyperlipidemia: Secondary | ICD-10-CM | POA: Diagnosis not present

## 2022-04-19 DIAGNOSIS — R252 Cramp and spasm: Secondary | ICD-10-CM

## 2022-04-19 DIAGNOSIS — N6489 Other specified disorders of breast: Secondary | ICD-10-CM

## 2022-04-19 NOTE — Progress Notes (Signed)
[facility] [place], [place] [address]  Internal MEDICINE  Office Visit Note  Patient Name: Virginia Phillips  [id number]  [id number]  Date of Service: 04/19/2022  Chief Complaint  Patient presents with   Follow-up    Review labs    HPI Virginia Phillips presents for a follow-up visit to review lab results.  Upcoming mamogram on monday Cholesterol improving Tired all the time Still having muscle cramps, takes mustard. Labs were normal -- iron, magnesium, potassium and CBC     Current Medication: Outpatient Encounter Medications as of 04/19/2022  Medication Sig   acetaminophen (TYLENOL) 650 MG CR tablet Take 650 mg by mouth every 8 (eight) hours as needed for pain.   albuterol (VENTOLIN HFA) 108 (90 Base) MCG/ACT inhaler INHALE 2 PUFFS INTO THE LUNGS EVERY 6 HOURS AS NEEDED FOR WHEEZING OR SHORTNESS OF BREATH   amLODipine (NORVASC) 5 MG tablet TAKE 1 TABLET(5 MG) BY MOUTH DAILY   aspirin 325 MG tablet Take 325 mg by mouth daily.   aspirin-acetaminophen-caffeine (EXCEDRIN MIGRAINE) 250-250-65 MG tablet Take 2 tablets by mouth daily as needed for headache or migraine.   aspirin-sod bicarb-citric acid (ALKA-SELTZER) 325 MG TBEF tablet Take 650 mg by mouth every 6 (six) hours as needed (indigestion).   azithromycin (ZITHROMAX Z-PAK) 250 MG tablet 2 tabs on day one then 1 tab daily thereafter   Biotin 5 MG CAPS Take 5,000 mg by mouth daily at 2 PM.   BLACK CURRANT SEED OIL PO Take 1 capsule by mouth daily at 2 PM.   Cholecalciferol (VITAMIN D) 2000 UNITS CAPS Take 2,000 Units by mouth daily at 2 PM.    Coenzyme Q10 (CO Q10) 200 MG CAPS Take 200 mg by mouth daily at 2 PM.    ezetimibe (ZETIA) 10 MG tablet Take 1 tablet (10 mg total) by mouth daily.   Garlic 123XX123 MG CAPS Take 6,000 mg by mouth daily as needed.   ibuprofen (ADVIL,MOTRIN) 200 MG tablet Take 400 mg by mouth daily as needed for headache or moderate pain.    Lancets (ONETOUCH ULTRASOFT) lancets Check sugar once  daily DX 73.9 needs for one touch ultra 2 lancets   lubiprostone (AMITIZA) 8 MCG capsule Take 1 capsule (8 mcg total) by mouth 2 (two) times daily with a meal.   Magnesium 400 MG TABS Take by mouth daily.   Misc Natural Products (PETADOLEX 50) 50 MG CAPS Take 100 mg by mouth daily.   multivitamin-lutein (OCUVITE-LUTEIN) CAPS capsule Take 1 capsule by mouth daily.   Omega-3 Fatty Acids (OMEGA-3 FISH OIL) 1200 MG CAPS Take by mouth.   ONE TOUCH ULTRA TEST test strip CHECK BLOOD SUGAR ONCE DAILY AS DIRECTED   pilocarpine (SALAGEN) 5 MG tablet Take 5 mg by mouth 2 (two) times daily.   polyethylene glycol (MIRALAX / GLYCOLAX) packet Take 17 g by mouth daily.    Polyvinyl Alcohol-Povidone (REFRESH OP) Place 1 drop into both eyes daily as needed (dry eyes).   potassium chloride (KLOR-CON M) 10 MEQ tablet Take 1 tablet (10 mEq total) by mouth daily as needed. Take with lasix   pyridOXINE (VITAMIN B-6) 100 MG tablet Take 100 mg by mouth daily at 2 PM.   Riboflavin (B2) 100 MG TABS Take 100 mg by mouth 2 (two) times daily.   simvastatin (ZOCOR) 20 MG tablet Take 1 tablet (20 mg total) by mouth at bedtime.   Thiamine HCl (VITAMIN B-1) 250 MG tablet Take 250 mg by mouth daily  at 2 PM.   valsartan (DIOVAN) 80 MG tablet TAKE 1 TABLET(80 MG) BY MOUTH EVERY DAY   venlafaxine XR (EFFEXOR-XR) 75 MG 24 hr capsule TAKE 1 CAPSULE(75 MG) BY MOUTH DAILY AT 2 PM   vitamin B-12 (CYANOCOBALAMIN) 1000 MCG tablet Take 1,000 mcg by mouth daily at 2 PM.   vitamin C (ASCORBIC ACID) 500 MG tablet Take 500 mg by mouth daily at 2 PM.   No facility-administered encounter medications on file as of 04/19/2022.    Surgical History: Past Surgical History:  Procedure Laterality Date   ANKLE FRACTURE SURGERY Right 2007   COLONOSCOPY WITH PROPOFOL N/A 05/10/2017   Procedure: COLONOSCOPY WITH PROPOFOL;  Surgeon: [name], MD;  Location: [facility] ENDOSCOPY;  Service: Endoscopy;  Laterality: N/A;   ESOPHAGOGASTRODUODENOSCOPY  (EGD) WITH PROPOFOL N/A 10/17/2017   Procedure: ESOPHAGOGASTRODUODENOSCOPY (EGD) WITH PROPOFOL;  Surgeon: [name], MD;  Location: [facility] ENDOSCOPY;  Service: Endoscopy;  Laterality: N/A;   GANGLION CYST EXCISION Right    wrist   HERNIA REPAIR     2011   KNEE ARTHROSCOPY W/ ACL RECONSTRUCTION Right 2008   KNEE SURGERY  08/05/2007   LASIK     NISSEN FUNDOPLICATION  0000000    Medical History: Past Medical History:  Diagnosis Date   Coronary artery disease    GERD (gastroesophageal reflux disease)    Glaucoma    Headache    migraines   History of shingles    Hyperlipidemia    Hypertension    Pre-diabetes    Pulmonary embolism on right [facility]) 2007   right lung   Sjogren's syndrome ([place])    Stroke ([place]) 1998    Family History: Family History  Problem Relation Age of Onset   Congestive Heart Failure Mother    Parkinson's disease Mother    Diabetes Father    Scleroderma Sister    Cancer Brother     Social History   Socioeconomic History   Marital status: Married    Spouse name: Not on file   Number of children: 1   Years of education: Not on file   Highest education level: Some college, no degree  Occupational History   Occupation: retired  Tobacco Use   Smoking status: Never   Smokeless tobacco: Never  Vaping Use   Vaping Use: Never used  Substance and Sexual Activity   Alcohol use: No    Alcohol/week: 0.0 standard drinks of alcohol   Drug use: No   Sexual activity: Not on file  Other Topics Concern   Not on file  Social History Narrative   Not on file   Social Determinants of Health   Financial Resource Strain: Low Risk  (06/14/2017)   Overall Financial Resource Strain (CARDIA)    Difficulty of Paying Living Expenses: Not hard at all  Food Insecurity: No Food Insecurity (12/05/2021)   Hunger Vital Sign    Worried About Running Out of Food in the Last Year: Never true    Ran Out of Food in the Last Year: Never true  Transportation Needs: No  Transportation Needs (12/05/2021)   PRAPARE - Hydrologist (Medical): No    Lack of Transportation (Non-Medical): No  Physical Activity: Not on file  Stress: No Stress Concern Present (06/14/2017)   [place]    Feeling of Stress : Only a little  Social Connections: Not on file  Intimate Partner Violence: Not At Risk (12/05/2021)  Humiliation, Afraid, Rape, and Kick questionnaire    Fear of Current or Ex-Partner: No    Emotionally Abused: No    Physically Abused: No    Sexually Abused: No      Review of Systems  Constitutional:  Negative for chills, fatigue and fever.  HENT: Negative.    Respiratory: Negative.  Negative for cough, chest tightness, shortness of breath and wheezing.   Cardiovascular:  Negative for chest pain (left breast pain) and palpitations.  Gastrointestinal:  Positive for constipation. Negative for abdominal pain.  Genitourinary: Negative.   Musculoskeletal:  Positive for back pain (chronic) and myalgias (leg cramps).    Vital Signs: BP (!) 143/65   Pulse 90   Temp (!) 97.4 F (36.3 C)   Resp 16   Ht '5\' 7"'$  (1.702 m)   Wt 187 lb 12.8 oz (85.2 kg)   SpO2 96%   BMI 29.41 kg/m    Physical Exam Vitals reviewed.  Constitutional:      General: She is not in acute distress.    Appearance: Normal appearance. She is well-developed. She is not ill-appearing.  HENT:     Head: Normocephalic and atraumatic.  Eyes:     Pupils: Pupils are equal, round, and reactive to light.  Cardiovascular:     Rate and Rhythm: Normal rate and regular rhythm.  Pulmonary:     Effort: Pulmonary effort is normal. No respiratory distress.  Neurological:     Mental Status: She is alert and oriented to person, place, and time.  Psychiatric:        Mood and Affect: Mood normal.        Behavior: Behavior normal.        Assessment/Plan: 1. Mixed hyperlipidemia No changes,  continue as instructed, level are improving. Continue simvastatin and ezetimibe as prescribed.   2. Leg cramps Takes mustard, labs that could indicate problems with cramps were all normal. Ropinirole script offered but declined. Instructed her to try magnesium oxide 400 mg OTC at night   3. Breast asymmetry Added ultrasound of right side for breast asymmetry - US BREAST LTD UNI RIGHT INC AXILLA; Future   General Counseling: Analeah verbalizes understanding of the findings of todays visit and agrees with plan of treatment. I have discussed any further diagnostic evaluation that may be needed or ordered today. We also reviewed her medications today. she has been encouraged to call the office with any questions or concerns that should arise related to todays visit.    Orders Placed This Encounter  Procedures   US BREAST LTD UNI RIGHT INC AXILLA    No orders of the defined types were placed in this encounter.   Return in about 3 weeks (around 05/10/2022) for F/U, Loriann Bosserman PCP.   Total time spent:30 Minutes Time spent includes review of chart, medications, test results, and follow up plan with the patient.   [place] Controlled Substance Database was reviewed by me.  This patient was seen by Jonetta Osgood, FNP-C in collaboration with Dr. Clayborn Bigness as a part of collaborative care agreement.   Jeremiah Curci R. Valetta Fuller, MSN, FNP-C Internal medicine

## 2022-04-24 DIAGNOSIS — N644 Mastodynia: Secondary | ICD-10-CM | POA: Diagnosis not present

## 2022-05-10 ENCOUNTER — Ambulatory Visit: Payer: Medicare Other | Admitting: Nurse Practitioner

## 2022-06-16 ENCOUNTER — Other Ambulatory Visit: Payer: Self-pay

## 2022-06-16 ENCOUNTER — Emergency Department
Admission: EM | Admit: 2022-06-16 | Discharge: 2022-06-16 | Discharge status: Against Medical Advice | Payer: Medicare Other | Attending: Emergency Medicine | Admitting: Emergency Medicine

## 2022-06-16 ENCOUNTER — Emergency Department: Payer: Medicare Other

## 2022-06-16 DIAGNOSIS — R519 Headache, unspecified: Secondary | ICD-10-CM | POA: Diagnosis not present

## 2022-06-16 DIAGNOSIS — R531 Weakness: Secondary | ICD-10-CM | POA: Insufficient documentation

## 2022-06-16 DIAGNOSIS — Z5321 Procedure and treatment not carried out due to patient leaving prior to being seen by health care provider: Secondary | ICD-10-CM | POA: Insufficient documentation

## 2022-06-16 DIAGNOSIS — R0602 Shortness of breath: Secondary | ICD-10-CM | POA: Insufficient documentation

## 2022-06-16 LAB — BASIC METABOLIC PANEL
Anion gap: 9 (ref 5–15)
BUN: 15 mg/dL (ref 8–23)
CO2: 25 mmol/L (ref 22–32)
Calcium: 9.1 mg/dL (ref 8.9–10.3)
Chloride: 103 mmol/L (ref 98–111)
Creatinine, Ser: 0.88 mg/dL (ref 0.44–1.00)
GFR, Estimated: >60 mL/min (ref >60)
Glucose, Bld: 124 mg/dL — ABNORMAL HIGH (ref 70–99)
Potassium: 4 mmol/L (ref 3.5–5.1)
Sodium: 137 mmol/L (ref 135–145)

## 2022-06-16 LAB — CBC
HCT: 45.1 % (ref 36.0–46.0)
Hemoglobin: 14.7 g/dL (ref 12.0–15.0)
MCH: 31.3 pg (ref 26.0–34.0)
MCHC: 32.6 g/dL (ref 30.0–36.0)
MCV: 96.2 fL (ref 80.0–100.0)
Platelets: 266 10*3/uL (ref 150–400)
RBC: 4.69 MIL/uL (ref 3.87–5.11)
RDW: 12.7 % (ref 11.5–15.5)
WBC: 7 10*3/uL (ref 4.0–10.5)
nRBC: 0 % (ref 0.0–0.2)

## 2022-06-16 NOTE — ED Triage Notes (Addendum)
Pt to ED via POV from home. Pt reports SOB worse with exertion and at rest. Pt also reports feeling generalized weakness that started 30-60 mins ago. Pt also states right sided HA. Pt reports she thinks she is having a another stroke. Pt speaking in complete sentences. No unilateral weakness. No visual changes. No speech changes. NIH 0. Blue top sent to lab

## 2022-06-16 NOTE — ED Notes (Signed)
Pt came to the front desk stating that she was tired of waiting and she was going to go home and go to bed. Pt asked that we call her with her results. Pt was informed that we do not typically call with results unless they are abnormal but she can check her MyChart account for her results. Pt verbalized understanding. Pt signed AMA form and verbalized her understanding that she was leaving her at own risk. Pt A & O x 4, ambulatory without difficulty or distress and with equal and unlabored respirations. Pt left with her husband.

## 2022-06-22 DIAGNOSIS — Z8673 Personal history of transient ischemic attack (TIA), and cerebral infarction without residual deficits: Secondary | ICD-10-CM | POA: Diagnosis not present

## 2022-06-22 DIAGNOSIS — R29898 Other symptoms and signs involving the musculoskeletal system: Secondary | ICD-10-CM | POA: Diagnosis not present

## 2022-06-22 DIAGNOSIS — R519 Headache, unspecified: Secondary | ICD-10-CM | POA: Diagnosis not present

## 2022-06-26 ENCOUNTER — Telehealth: Payer: Self-pay | Admitting: Cardiovascular Disease

## 2022-06-26 NOTE — Telephone Encounter (Signed)
Patient is scheduled for a hospital follow-up appointment Friday 06/30/22 8:25 AM

## 2022-06-26 NOTE — Telephone Encounter (Signed)
Pt c/o Shortness Of Breath: STAT if SOB developed within the last 24 hours or pt is noticeably SOB on the phone  1. Are you currently SOB (can you hear that pt is SOB on the phone)? No   2. How long have you been experiencing SOB? Since having stroke  3. Are you SOB when sitting or when up moving around? Moving around  4. Are you currently experiencing any other symptoms? Racing heart, lightheaded, very fatigued

## 2022-06-29 NOTE — Progress Notes (Signed)
Cardiology Office Note:    Date:  06/30/2022   ID:  Azaylah, Appel [birth year], MRN [id number]  PCP:  [name], NP  [facility] Cardiologist:  [name], MD  [facility] Electrophysiologist:  None   Referring MD: [name], NP   Chief Complaint: Hospital follow-up  History of Present Illness:    Virginia Phillips is a 77 y.o. 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   She was previously followed by Dr. Gwen Pounds, though transitioned to Dr. Mariah Milling in 07/2020.  Prior echo at [facility] in 03/2018 demonstrated an EF greater than 55%, normal wall motion, mild LVH, normal RV systolic function, and trivial mitral/tricuspid regurgitation.  She has previously undergone carotid artery ultrasound in 12/2019, though results are unavailable for review.   She established care with Dr. Mariah Milling on 08/12/2020 for a second opinion regarding episodes of chest discomfort that would wake her from sleep and took several hours to improve with stuttering chest pain afterwards.  She did not feel like symptoms were related to her prior hiatal hernia.  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  Given symptoms, she underwent coronary CTA on 08/26/2020 showed a calcium score of 231 which was the 76 percentile.  There was minimal nonobstructive CAD.  Incidentally, there was left to right intra-arterial communication noted.  Given calcium score, pravastatin was titrated to 20 mg.     She notified our office in 08/2020 she felt like she was off balance following titration of pravastatin from 10 mg to 20 mg.  She was subsequently transition to  rosuvastatin 5 mg.   Echo with bubble study on 10/05/2020 demonstrated an EF of 60 to 65%, mild LVH, grade 1 diastolic dysfunction, normal RV systolic function and ventricular cavity size, no significant valvular abnormalities, and estimated right atrial pressure of 3 mmHg, and agitated saline contrast bubble study was negative without evidence of any intra-arterial shunting.   The patient was seen 03/2021 reporting atypical chest pain and palpitations Heart monitor showed predominantly normal sinus rhythm, 7 runs of SVT, the run with the fastest interval lasting 18 beats with a max rate of 160 was also the longest. Isolated ectopy.  Patient was recently admitted in October 2023 for acute stroke.  She presented with left arm weakness.  MRI showed small punctate acute infarct.  Neurology saw the patient.  Many of the work-up was unrevealing and reassuring. Echo bubble study showed LVEF 55-60%, G1DD, positive with shunting observed within 3-6 cardiac cycles suggestive of interatiral shunt. Patient was discharged on DAPT with aspirin and Plavix x3 weeks followed by Plavix monotherapy indefinitely. Statin was added to home medications.   Patient was last seen in January 2024 was overall stable from a cardiac perspective.  Patient did want to be on Plavix due to lightheadedness.  Today, the patient is taking Aspirin 325 and Prilosec. She has overall been OK.  Patient thinks that she has A-fib due to symptoms.  She reports chronic DOE. She feels lightheaded and dizziness, worse when she is walking. She feels palpitations. She is unsure how high heart rate goes.  At times mainly. She feels her energy is low.  Patient would like to discuss loop  recorder with the EP.    She had a recent ER visit 4/19 for possible stroke.  Labs were normal.  Patient left before being seen.  Past Medical History:  Diagnosis Date   Coronary artery disease    GERD (gastroesophageal reflux disease)    Glaucoma    Headache     migraines   History of shingles    Hyperlipidemia    Hypertension    Pre-diabetes    Pulmonary embolism on right [facility]) 2007   right lung   Sjogren's syndrome (HCC)    Stroke (HCC) 1998    Past Surgical History:  Procedure Laterality Date   ANKLE FRACTURE SURGERY Right 2007   COLONOSCOPY WITH PROPOFOL N/A 05/10/2017   Procedure: COLONOSCOPY WITH PROPOFOL;  Surgeon: Pasty Spillers, MD;  Location: [facility] ENDOSCOPY;  Service: Endoscopy;  Laterality: N/A;   ESOPHAGOGASTRODUODENOSCOPY (EGD) WITH PROPOFOL N/A 10/17/2017   Procedure: ESOPHAGOGASTRODUODENOSCOPY (EGD) WITH PROPOFOL;  Surgeon: Pasty Spillers, MD;  Location: [facility] ENDOSCOPY;  Service: Endoscopy;  Laterality: N/A;   GANGLION CYST EXCISION Right    wrist   HERNIA REPAIR     2011   KNEE ARTHROSCOPY W/ ACL RECONSTRUCTION Right 2008   KNEE SURGERY  08/05/2007   LASIK     NISSEN FUNDOPLICATION  2012    Current Medications: Current Meds  Medication Sig   acetaminophen (TYLENOL) 650 MG CR tablet Take 650 mg by mouth every 8 (eight) hours as needed for pain.   amLODipine (NORVASC) 5 MG tablet TAKE 1 TABLET(5 MG) BY MOUTH DAILY   aspirin 325 MG tablet Take 325 mg by mouth daily.   aspirin-acetaminophen-caffeine (EXCEDRIN MIGRAINE) 250-250-65 MG tablet Take 2 tablets by mouth daily as needed for headache or migraine.   aspirin-sod bicarb-citric acid (ALKA-SELTZER) 325 MG TBEF tablet Take 650 mg by mouth every 6 (six) hours as needed (indigestion).   Biotin 5 MG CAPS Take 5,000 mg by mouth daily at 2 PM.   BLACK CURRANT SEED OIL PO Take 1 capsule by mouth daily at 2 PM.   Cholecalciferol (VITAMIN D) 2000 UNITS CAPS Take 2,000 Units by mouth daily at 2 PM.    Coenzyme Q10 (CO Q10) 200 MG CAPS Take 200 mg by mouth daily at 2 PM.    Garlic 1000 MG CAPS Take 6,000 mg by mouth daily as needed.   ibuprofen (ADVIL,MOTRIN) 200 MG tablet Take 400 mg by mouth daily as needed for headache or moderate pain.    Lancets (ONETOUCH ULTRASOFT)  lancets Check sugar once daily DX 73.9 needs for one touch ultra 2 lancets   lubiprostone (AMITIZA) 8 MCG capsule Take 1 capsule (8 mcg total) by mouth 2 (two) times daily with a meal.   Magnesium 400 MG TABS Take by mouth daily.   Misc Natural Products (PETADOLEX 50) 50 MG CAPS Take 100 mg by mouth daily.   multivitamin-lutein (OCUVITE-LUTEIN) CAPS capsule Take 1 capsule by mouth daily.   Omega-3 Fatty Acids (OMEGA-3 FISH OIL) 1200 MG CAPS Take by mouth.   omeprazole (PRILOSEC OTC) 20 MG tablet Take 20 mg by mouth daily.   ONE TOUCH ULTRA TEST test strip CHECK BLOOD SUGAR ONCE DAILY AS DIRECTED   pilocarpine (SALAGEN) 5 MG tablet Take 5 mg by mouth 2 (two) times daily.   polyethylene glycol (MIRALAX / GLYCOLAX) packet Take 17 g by mouth daily.    Polyvinyl Alcohol-Povidone (REFRESH OP) Place 1 drop into both eyes daily as needed (dry eyes).   pyridOXINE (VITAMIN B-6)  100 MG tablet Take 100 mg by mouth daily at 2 PM.   Riboflavin (B2) 100 MG TABS Take 100 mg by mouth 2 (two) times daily.   Thiamine HCl (VITAMIN B-1) 250 MG tablet Take 250 mg by mouth daily at 2 PM.   valsartan (DIOVAN) 80 MG tablet TAKE 1 TABLET(80 MG) BY MOUTH EVERY DAY   venlafaxine XR (EFFEXOR-XR) 75 MG 24 hr capsule TAKE 1 CAPSULE(75 MG) BY MOUTH DAILY AT 2 PM   vitamin B-12 (CYANOCOBALAMIN) 1000 MCG tablet Take 1,000 mcg by mouth daily at 2 PM.   vitamin C (ASCORBIC ACID) 500 MG tablet Take 500 mg by mouth daily at 2 PM.   [DISCONTINUED] potassium chloride (KLOR-CON M) 10 MEQ tablet Take 1 tablet (10 mEq total) by mouth daily as needed. Take with lasix     Allergies:   Linzess [linaclotide], Amoxicillin, Blue dyes (parenteral), Butalbital-aspirin-caffeine, Clindamycin/lincomycin, Clobetasol, Dexlansoprazole, Diphenhydramine, Doxycycline, Esomeprazole, Gabapentin, Green dye, Iodinated contrast media, Meloxicam, Metronidazole, Nortriptyline hcl, Prednisolone acetate, Tetracycline, Baby oil, Cortisone, Diphenhydramine hcl,  Levofloxacin, Mineral oil, Prednisone, and Sulfa antibiotics   Social History   Socioeconomic History   Marital status: Married    Spouse name: Not on file   Number of children: 1   Years of education: Not on file   Highest education level: Some college, no degree  Occupational History   Occupation: retired  Tobacco Use   Smoking status: Never   Smokeless tobacco: Never  Vaping Use   Vaping Use: Never used  Substance and Sexual Activity   Alcohol use: No    Alcohol/week: 0.0 standard drinks of alcohol   Drug use: No   Sexual activity: Not on file  Other Topics Concern   Not on file  Social History Narrative   Not on file   Social Determinants of Health   Financial Resource Strain: Low Risk  (06/14/2017)   Overall Financial Resource Strain (CARDIA)    Difficulty of Paying Living Expenses: Not hard at all  Food Insecurity: No Food Insecurity (12/05/2021)   Hunger Vital Sign    Worried About Running Out of Food in the Last Year: Never true    Ran Out of Food in the Last Year: Never true  Transportation Needs: No Transportation Needs (12/05/2021)   PRAPARE - Administrator, Civil Service (Medical): No    Lack of Transportation (Non-Medical): No  Physical Activity: Not on file  Stress: No Stress Concern Present (06/14/2017)   [employer] of Occupational Health - Occupational Stress Questionnaire    Feeling of Stress : Only a little  Social Connections: Not on file     Family History: The patient's family history includes Cancer in her brother; Congestive Heart Failure in her mother; Diabetes in her father; Parkinson's disease in her mother; Scleroderma in her sister.  ROS:   Please see the history of present illness.     All other systems reviewed and are negative.  EKGs/Labs/Other Studies Reviewed:    The following studies were reviewed today:  Echo 11/2021   1. Left ventricular ejection fraction, by estimation, is 55 to 60%. The  left ventricle  has normal function. The left ventricle has no regional  wall motion abnormalities. Left ventricular diastolic parameters are  consistent with Grade I diastolic  dysfunction (impaired relaxation).   2. Right ventricular systolic function is normal. The right ventricular  size is normal. Tricuspid regurgitation signal is inadequate for assessing  PA pressure.   3. The mitral valve  is normal in structure. No evidence of mitral valve  regurgitation. No evidence of mitral stenosis.   4. The aortic valve is normal in structure. Aortic valve regurgitation is  not visualized. No aortic stenosis is present.   5. Agitated saline contrast bubble study was positive with shunting  observed within 3-6 cardiac cycles suggestive of interatrial shunt.   EKG:  EKG is ordered today.  The ekg ordered today demonstrates NSR 79bpm, nonspecific ST changes  Recent Labs: 08/09/2021: TSH 1.540 04/06/2022: ALT 15; Magnesium 2.2 06/16/2022: BUN 15; Creatinine, Ser 0.88; Hemoglobin 14.7; Platelets 266; Potassium 4.0; Sodium 137  Recent Lipid Panel    Component Value Date/Time   CHOL 163 04/06/2022 1345   CHOL 166 03/06/2012 1512   TRIG 189 (H) 04/06/2022 1345   TRIG 93 03/06/2012 1512   HDL 62 04/06/2022 1345   HDL 76 (H) 03/06/2012 1512   CHOLHDL 2.6 04/06/2022 1345   CHOLHDL 2.9 12/14/2016 1339   VLDL 19 03/06/2012 1512   LDLCALC 70 04/06/2022 1345   LDLCALC 96 12/14/2016 1339   LDLCALC 71 03/06/2012 1512   LDLDIRECT 107 (H) 12/05/2021 0803    Physical Exam:    VS:  BP 108/62 (BP Location: Left Arm, Patient Position: Sitting)   Pulse 79   Ht 5\' 6"  (1.676 m)   Wt 189 lb 6.4 oz (85.9 kg)   SpO2 93%   BMI 30.57 kg/m     Wt Readings from Last 3 Encounters:  06/30/22 189 lb 6.4 oz (85.9 kg)  04/19/22 187 lb 12.8 oz (85.2 kg)  04/05/22 188 lb 12.8 oz (85.6 kg)     GEN:  Well nourished, well developed in no acute distress HEENT: Normal NECK: No JVD; No carotid bruits LYMPHATICS: No  lymphadenopathy CARDIAC: RRR, no murmurs, rubs, gallops RESPIRATORY:  Clear to auscultation without rales, wheezing or rhonchi  ABDOMEN: Soft, non-tender, non-distended MUSCULOSKELETAL:  No edema; No deformity  SKIN: Warm and dry NEUROLOGIC:  Alert and oriented x 3 PSYCHIATRIC:  Normal affect   ASSESSMENT:    1. DOE (dyspnea on exertion)   2. Cerebrovascular accident (CVA), unspecified mechanism (HCC)   3. Essential hypertension   4. Hyperlipidemia, mixed   5. Coronary artery disease involving native coronary artery of native heart without angina pectoris   6. PFO (patent foramen ovale)    PLAN:    In order of problems listed above:  DOE/palpitations H/o stroke Patient reports dyspnea on exertion, lightheadedness, dizziness, palpitations. Patient is very suspicious she has Afib. Heart monitor in December 2023 did not show A-fib, did show 5 runs of SVT.  She does have a history of stroke. She is wanting to discuss loop recorder with EP to evaluate for afib.  I will replace referral to EP to further discuss loop recorder.  Recent blood work from ER showed normal CBC and BMET.  I will check a BNP.  She does not appear volume up on exam. I will reorder a limited echo.  Suspect symptoms may be from general deconditioning.  Hypertension Blood pressure is good today.  Continue Norvasc 5 mg daily and Valsartan 80mg  daily.  Hyperlipidemia LDL 70, continue simvastatin 20mg  daily.  Nonobstructive CAD Patient denies chest pain symptoms.  Cardiac CTA in June 2022 showed coronary calcium score 231, this was 76 percentile for age 46.  Overall mild nonobstructive CAD.  PFO Patient has PFO seen on prior echocardiograms.  Patient is on aspirin 325mg  daily.  Patient self continue Plavix due to lightheadedness.  Disposition: Follow up in 2 month(s) with MD    Signed, Jabier Deese David Stall, PA-C  06/30/2022 9:40 AM    [facility]

## 2022-06-30 ENCOUNTER — Ambulatory Visit: Payer: Medicare Other | Attending: Medical | Admitting: Medical

## 2022-06-30 ENCOUNTER — Encounter: Payer: Self-pay | Admitting: Medical

## 2022-06-30 ENCOUNTER — Other Ambulatory Visit
Admission: RE | Admit: 2022-06-30 | Discharge: 2022-06-30 | Disposition: A | Discharge status: Home / Self Care | Payer: Medicare Other | Source: Ambulatory Visit | Attending: Medical | Admitting: Medical

## 2022-06-30 VITALS — BP 108/62 | HR 79 | Ht 66.0 in | Wt 189.4 lb

## 2022-06-30 DIAGNOSIS — I639 Cerebral infarction, unspecified: Secondary | ICD-10-CM

## 2022-06-30 DIAGNOSIS — E782 Mixed hyperlipidemia: Secondary | ICD-10-CM

## 2022-06-30 DIAGNOSIS — I2510 Atherosclerotic heart disease of native coronary artery without angina pectoris: Secondary | ICD-10-CM

## 2022-06-30 DIAGNOSIS — Q2112 Patent foramen ovale: Secondary | ICD-10-CM

## 2022-06-30 DIAGNOSIS — R0609 Other forms of dyspnea: Secondary | ICD-10-CM | POA: Diagnosis not present

## 2022-06-30 DIAGNOSIS — I10 Essential (primary) hypertension: Secondary | ICD-10-CM | POA: Diagnosis not present

## 2022-06-30 LAB — BRAIN NATRIURETIC PEPTIDE: B Natriuretic Peptide: 10.4 pg/mL (ref 0.0–100.0)

## 2022-06-30 NOTE — Patient Instructions (Signed)
Medication Instructions:  Your physician recommends that you continue on your current medications as directed. Please refer to the Current Medication list given to you today.  *If you need a refill on your cardiac medications before your next appointment, please call your pharmacy*   Lab Work: Pro-BNP today If you have labs (blood work) drawn today and your tests are completely normal, you will receive your results only by: MyChart Message (if you have MyChart) OR A paper copy in the mail If you have any lab test that is abnormal or we need to change your treatment, we will call you to review the results.   Testing/Procedures: ECHO (limited) Your physician has requested that you have an echocardiogram. Echocardiography is a painless test that uses sound waves to create images of your heart. It provides your doctor with information about the size and shape of your heart and how well your heart's chambers and valves are working. This procedure takes approximately one hour. There are no restrictions for this procedure. Please do NOT wear cologne, perfume, aftershave, or lotions (deodorant is allowed). Please arrive 15 minutes prior to your appointment time.  Ambulatory Referral to Cardiac Electrophysiology (loop recorder)   Follow-Up: At [facility], you and your health needs are our priority.  As part of our continuing mission to provide you with exceptional heart care, we have created designated Provider Care Teams.  These Care Teams include your primary Cardiologist (physician) and Advanced Practice Providers (APPs -  Physician Assistants and Nurse Practitioners) who all work together to provide you with the care you need, when you need it.  Your next appointment:   2 month(s)  Provider:   [name], MD

## 2022-07-03 ENCOUNTER — Other Ambulatory Visit: Payer: Self-pay | Admitting: Nurse Practitioner

## 2022-07-03 DIAGNOSIS — K581 Irritable bowel syndrome with constipation: Secondary | ICD-10-CM

## 2022-07-19 ENCOUNTER — Ambulatory Visit: Payer: Medicare Other | Admitting: Nurse Practitioner

## 2022-07-25 ENCOUNTER — Ambulatory Visit: Payer: Medicare Other | Admitting: Cardiovascular Disease

## 2022-08-01 ENCOUNTER — Ambulatory Visit: Payer: Medicare Other | Attending: Medical

## 2022-08-01 DIAGNOSIS — R0609 Other forms of dyspnea: Secondary | ICD-10-CM | POA: Diagnosis not present

## 2022-08-01 DIAGNOSIS — I639 Cerebral infarction, unspecified: Secondary | ICD-10-CM

## 2022-08-01 LAB — ECHOCARDIOGRAM LIMITED
AV Mean grad: 4 mmHg
AV Peak grad: 7.7 mmHg
Ao pk vel: 1.39 m/s
Calc EF: 53.9 %
S' Lateral: 2.7 cm
Single Plane A2C EF: 49.6 %
Single Plane A4C EF: 57.5 %

## 2022-08-01 NOTE — Progress Notes (Unsigned)
Electrophysiology Office Note:    Date:  08/02/2022   ID:  Virginia Phillips 09/07/1945, MRN [id number]  [facility] Cardiologist:  [name], MD  [facility] Electrophysiologist:  [name], MD   Referring MD: [name], NP   Chief Complaint: Cryptogenic stroke  History of Present Illness:    Virginia Phillips is a 77 y.o. female who I am seeing today for an evaluation of cryptogenic stroke at the request of Virginia Phillips.  The patient was seen by Virginia on Jun 30, 2022.  The patient has a history of nonobstructive coronary artery disease, prediabetes, hypertension, hyperlipidemia, carotid artery disease, obstructive sleep apnea, hiatal hernia.  The patient was admitted in October 2023 for an acute stroke.  She had left arm weakness.  At the appointment with Virginia, the patient expressed an interest to pursue a loop recorder monitoring to rule out cardioembolic stroke related to atrial fibrillation.  I have seen her husband in the past for diagnosis of syncope.  He has a loop recorder that was implanted in April 2024 so she is aware of the technology.  She also tells me she is experienced several days of visual changes where her central vision is blurry.  She has had this happen to other times in her life.  She has an ophthalmologist but canceled her most recent appointment.      Their past medical, social and family history was reveiwed.   ROS:   Please see the history of present illness.    All other systems reviewed and are negative.  EKGs/Labs/Other Studies Reviewed:    The following studies were reviewed today:  February 10, 2022 ZIO monitor personally reviewed no atrial fibrillation.  Rare supraventricular ectopy.  December 05, 2021 echo EF normal, 55 to 60% RV normal No MR     Physical Exam:    VS:  BP 124/74   Pulse 72   Ht 5\' 6"  (1.676 m)   Wt 184 lb (83.5 kg)   SpO2 98%   BMI 29.70 kg/m     Wt Readings from Last 3 Encounters:   08/02/22 184 lb (83.5 kg)  06/30/22 189 lb 6.4 oz (85.9 kg)  04/19/22 187 lb 12.8 oz (85.2 kg)     GEN:  Well nourished, well developed in no acute distress CARDIAC: RRR, no murmurs, rubs, gallops RESPIRATORY:  Clear to auscultation without rales, wheezing or rhonchi       ASSESSMENT AND PLAN:    1. Acute CVA (cerebrovascular accident) (HCC)   2. PFO (patent foramen ovale)   3. Essential hypertension     #Cryptogenic Stroke Pathophysiology of cryptogenic stroke was discussed in detail during today's clinic appointment. I discussed the role of loop recorder monitoring in patients who have suffered a CVA/TIA. There has been no evidence of AF thus far in the patient's evaluation. Loop recorder monitors were discussed in detail including the implant procedure and its risks. I discussed the monthly monitoring costs associated with loop recorder monitoring. The patient would like to proceed with ILR implant.  #Visual changes She reports several episodes of visual changes.  Unclear what these would represent.  I have asked her to call her ophthalmologist to get an appointment for routine eye exam.  #Hypertension At goal today.  Recommend checking blood pressures 1-2 times per week at home and recording the values.  Recommend bringing these recordings to the primary care physician.   Follow-up with EP on an as-needed basis  Signed, [name]. [name], MD, [facility], [facility] 08/02/2022 11:49 AM    Electrophysiology [facility]   -------------------------  SURGEON:  [name], MD     PREPROCEDURE DIAGNOSIS:  Cryptogenic stroke    POSTPROCEDURE DIAGNOSIS: Cryptogenic stroke     PROCEDURES:   1. Implantable loop recorder implantation    INTRODUCTION:  Virginia Phillips presents with a history of cryptogenic stroke The costs of loop recorder monitoring have been discussed with the patient.    DESCRIPTION OF PROCEDURE:  Informed written consent was  obtained.  A preprocedural timeout was performed with the [name], RN. The patient required no sedation for the procedure today.  Mapping over the patient's chest was performed to identify the area where electrograms were most prominent for ILR recording.  This area was found to be the left parasternal region over the 4th intercostal space. The patients left chest was therefore prepped and draped in the usual sterile fashion. The skin overlying the left parasternal region was infiltrated with lidocaine for local analgesia.  A 0.5-cm incision was made over the left parasternal region over the 3rd intercostal space.  A subcutaneous ILR pocket was fashioned using a combination of sharp and blunt dissection.  A [employer] Reveal Ellouise Newer [id number]) implantable loop recorder was then placed into the pocket  R waves were very prominent and measured >0.64mV.  Steri- Strips and a sterile dressing were then applied.  There were no early apparent complications.     CONCLUSIONS:   1. Successful implantation of a implantable loop recorder for Cryptogenic stroke  2. No early apparent complications.   [name] T. [name], MD, [facility], [facility] Cardiac Electrophysiology

## 2022-08-02 ENCOUNTER — Ambulatory Visit: Payer: Medicare Other | Attending: Cardiovascular Disease | Admitting: Cardiology

## 2022-08-02 VITALS — BP 124/74 | HR 72 | Ht 66.0 in | Wt 184.0 lb

## 2022-08-02 DIAGNOSIS — I639 Cerebral infarction, unspecified: Secondary | ICD-10-CM | POA: Diagnosis not present

## 2022-08-02 DIAGNOSIS — I10 Essential (primary) hypertension: Secondary | ICD-10-CM | POA: Insufficient documentation

## 2022-08-02 DIAGNOSIS — Q2112 Patent foramen ovale: Secondary | ICD-10-CM | POA: Diagnosis not present

## 2022-08-02 NOTE — Patient Instructions (Signed)
Please call your eye doctor for an appointment  Medication Instructions:  Your physician recommends that you continue on your current medications as directed. Please refer to the Current Medication list given to you today.  Labwork: None ordered.  Testing/Procedures: None ordered.  Follow-Up:  As needed with Dr. Chryl Heck   Implantable Loop Recorder Placement, Care After This sheet gives you information about how to care for yourself after your procedure. Your health care provider may also give you more specific instructions. If you have problems or questions, contact your health care provider. What can I expect after the procedure? After the procedure, it is common to have: Soreness or discomfort near the incision. Some swelling or bruising near the incision.  Follow these instructions at home: Incision care  Monitor your cardiac device site for redness, swelling, and drainage. Call the device clinic at [phone] if you experience these symptoms or fever/chills.  Keep the large square bandage on your site for 24 hours and then you may remove it yourself. Keep the steri-strips underneath in place.   You may shower after 72 hours / 3 days from your procedure with the steri-strips in place. They will usually fall off on their own, or may be removed after 10 days. Pat dry.   Avoid lotions, ointments, or perfumes over your incision until it is well-healed.  Please do not submerge in water until your site is completely healed.   Your device is MRI compatible.   Remote monitoring is used to monitor your cardiac device from home. This monitoring is scheduled every month by our office. It allows [place] to keep an eye on the function of your device to ensure it is working properly.  If your wound site starts to bleed apply pressure.    For help with the monitor please call [employer] Monitor Support Specialist directly at [phone].    If you have any questions/concerns please call  the device clinic at [phone].  Activity  Return to your normal activities.  General instructions Follow instructions from your health care provider about how to manage your implantable loop recorder and transmit the information. Learn how to activate a recording if this is necessary for your type of device. You may go through a metal detection gate, and you may let someone hold a metal detector over your chest. Show your ID card if needed. Do not have an MRI unless you check with your health care provider first. Take over-the-counter and prescription medicines only as told by your health care provider. Keep all follow-up visits as told by your health care provider. This is important. Contact a health care provider if: You have redness, swelling, or pain around your incision. You have a fever. You have pain that is not relieved by your pain medicine. You have triggered your device because of fainting (syncope) or because of a heartbeat that feels like it is racing, slow, fluttering, or skipping (palpitations). Get help right away if you have: Chest pain. Difficulty breathing. Summary After the procedure, it is common to have soreness or discomfort near the incision. Change your dressing as told by your health care provider. Follow instructions from your health care provider about how to manage your implantable loop recorder and transmit the information. Keep all follow-up visits as told by your health care provider. This is important. This information is not intended to replace advice given to you by your health care provider. Make sure you discuss any questions you have with your health care provider. Document  Released: 01/25/2015 Document Revised: 03/31/2017 Document Reviewed: 03/31/2017 Elsevier Patient Education  2020 [employer].

## 2022-08-07 DIAGNOSIS — G453 Amaurosis fugax: Secondary | ICD-10-CM | POA: Diagnosis not present

## 2022-08-07 DIAGNOSIS — Z961 Presence of intraocular lens: Secondary | ICD-10-CM | POA: Diagnosis not present

## 2022-08-07 DIAGNOSIS — H40003 Preglaucoma, unspecified, bilateral: Secondary | ICD-10-CM | POA: Diagnosis not present

## 2022-08-09 ENCOUNTER — Other Ambulatory Visit: Payer: Self-pay | Admitting: Nurse Practitioner

## 2022-08-09 DIAGNOSIS — N959 Unspecified menopausal and perimenopausal disorder: Secondary | ICD-10-CM

## 2022-08-10 ENCOUNTER — Ambulatory Visit (INDEPENDENT_AMBULATORY_CARE_PROVIDER_SITE_OTHER): Payer: Medicare Other | Admitting: Nurse Practitioner

## 2022-08-10 ENCOUNTER — Encounter: Payer: Self-pay | Admitting: Nurse Practitioner

## 2022-08-10 ENCOUNTER — Telehealth: Payer: Self-pay

## 2022-08-10 VITALS — BP 130/74 | HR 97 | Temp 97.2°F | Resp 16 | Ht 66.0 in | Wt 186.6 lb

## 2022-08-10 DIAGNOSIS — H547 Unspecified visual loss: Secondary | ICD-10-CM | POA: Diagnosis not present

## 2022-08-10 DIAGNOSIS — Z0001 Encounter for general adult medical examination with abnormal findings: Secondary | ICD-10-CM | POA: Diagnosis not present

## 2022-08-10 DIAGNOSIS — R7303 Prediabetes: Secondary | ICD-10-CM | POA: Diagnosis not present

## 2022-08-10 DIAGNOSIS — G459 Transient cerebral ischemic attack, unspecified: Secondary | ICD-10-CM

## 2022-08-10 DIAGNOSIS — I10 Essential (primary) hypertension: Secondary | ICD-10-CM | POA: Diagnosis not present

## 2022-08-10 NOTE — Progress Notes (Signed)
[facility] [address] [place], [place] [address]  Internal MEDICINE  Office Visit Note  Patient Name: Virginia Phillips  [id number]  [id number]  Date of Service: 08/10/2022  Chief Complaint  Patient presents with   Gastroesophageal Reflux   Hypertension   Hyperlipidemia   Medicare Wellness    HPI Virginia Phillips presents for an annual well visit and physical exam.  Well-appearing 77 y.o. female with hypertension, migraines, allergic rhinitis, IBS, prediabetes, and history of stroke and pulmonary embolism.  Routine CRC screening: due this year  Routine mammogram: done in February 2024 DEXA scan:done in 2015 Eye exam: seen by eye doctor yesterday Labs:  had labs done in February and the results were previously discussed  New or worsening pain: chronic pain but no changes  Other concerns: worried she had another stroke in the past week -- issues with vision and memory.  Recent implantable loop recorder placed on 08/01/22       08/10/2022    3:18 PM 08/08/2021    3:12 PM 08/03/2020    3:26 PM  MMSE - Mini Mental State Exam  Orientation to time 5 5 5   Orientation to Place 5 5 5   Registration 3 3 3   Attention/ Calculation 5 5 5   Recall 3 3 3   Language- name 2 objects 2 2 2   Language- repeat 1 1 1   Language- follow 3 step command 3 3 3   Language- read & follow direction 1 1 1   Write a sentence 1 1 1   Copy design 1 1 1   Total score 30 30 30     Functional Status Survey: Is the patient deaf or have difficulty hearing?: No Does the patient have difficulty seeing, even when wearing glasses/contacts?: No Does the patient have difficulty concentrating, remembering, or making decisions?: No Does the patient have difficulty walking or climbing stairs?: Yes Does the patient have difficulty dressing or bathing?: No Does the patient have difficulty doing errands alone such as visiting a doctor's office or shopping?: Yes     12/05/2021    5:49 PM 12/06/2021   12:00 AM 12/06/2021    10:00 AM 04/05/2022   10:32 AM 08/10/2022    3:13 PM  Fall Risk  Falls in the past year?    0 0  Was there an injury with Fall?    0 0  Fall Risk Category Calculator    0 0  (RETIRED) Patient Fall Risk Level Low fall risk Low fall risk Low fall risk    Patient at Risk for Falls Due to    No Fall Risks No Fall Risks  Fall risk Follow up    Falls evaluation completed Falls evaluation completed       08/10/2022    3:13 PM  Depression screen PHQ 2/9  Decreased Interest 0  Down, Depressed, Hopeless 0  PHQ - 2 Score 0       05/17/2017    3:26 PM  GAD 7 : Generalized Anxiety Score  Nervous, Anxious, on Edge 0  Control/stop worrying 0  Worry too much - different things 0  Trouble relaxing 2  Restless 1  Easily annoyed or irritable 0  Afraid - awful might happen 0  Total GAD 7 Score 3  Anxiety Difficulty Not difficult at all      Current Medication: Outpatient Encounter Medications as of 08/10/2022  Medication Sig   acetaminophen (TYLENOL) 650 MG CR tablet Take 650 mg by mouth every 8 (eight) hours as needed for pain.  amLODipine (NORVASC) 5 MG tablet TAKE 1 TABLET(5 MG) BY MOUTH DAILY   aspirin 325 MG tablet Take 325 mg by mouth daily.   aspirin-acetaminophen-caffeine (EXCEDRIN MIGRAINE) 250-250-65 MG tablet Take 2 tablets by mouth daily as needed for headache or migraine.   aspirin-sod bicarb-citric acid (ALKA-SELTZER) 325 MG TBEF tablet Take 650 mg by mouth every 6 (six) hours as needed (indigestion).   Biotin 5 MG CAPS Take 5,000 mg by mouth daily at 2 PM.   Cholecalciferol (VITAMIN D) 2000 UNITS CAPS Take 2,000 Units by mouth daily at 2 PM.    Coenzyme Q10 (CO Q10) 200 MG CAPS Take 200 mg by mouth daily at 2 PM.    ibuprofen (ADVIL,MOTRIN) 200 MG tablet Take 400 mg by mouth daily as needed for headache or moderate pain.    Lancets (ONETOUCH ULTRASOFT) lancets Check sugar once daily DX 73.9 needs for one touch ultra 2 lancets   lubiprostone (AMITIZA) 8 MCG capsule TAKE ONE  CAPSULE BY MOUTH TWICE DAILY WITH A MEAL   Magnesium 400 MG TABS Take by mouth daily.   Misc Natural Products (PETADOLEX 50) 50 MG CAPS Take 100 mg by mouth daily.   omeprazole (PRILOSEC OTC) 20 MG tablet Take 20 mg by mouth daily.   ONE TOUCH ULTRA TEST test strip CHECK BLOOD SUGAR ONCE DAILY AS DIRECTED   pilocarpine (SALAGEN) 5 MG tablet Take 5 mg by mouth 2 (two) times daily.   polyethylene glycol (MIRALAX / GLYCOLAX) packet Take 17 g by mouth daily.    Polyvinyl Alcohol-Povidone (REFRESH OP) Place 1 drop into both eyes daily as needed (dry eyes).   pyridOXINE (VITAMIN B-6) 100 MG tablet Take 100 mg by mouth daily at 2 PM.   Riboflavin (B2) 100 MG TABS Take 100 mg by mouth 2 (two) times daily.   Thiamine HCl (VITAMIN B-1) 250 MG tablet Take 250 mg by mouth daily at 2 PM.   valsartan (DIOVAN) 80 MG tablet TAKE 1 TABLET(80 MG) BY MOUTH EVERY DAY   venlafaxine XR (EFFEXOR-XR) 75 MG 24 hr capsule TAKE 1 CAPSULE BY MOUTH DAILY AT 2 PM   vitamin B-12 (CYANOCOBALAMIN) 1000 MCG tablet Take 1,000 mcg by mouth daily at 2 PM.   vitamin C (ASCORBIC ACID) 500 MG tablet Take 500 mg by mouth daily at 2 PM.   [DISCONTINUED] azithromycin (ZITHROMAX Z-PAK) 250 MG tablet 2 tabs on day one then 1 tab daily thereafter   ezetimibe (ZETIA) 10 MG tablet Take 1 tablet (10 mg total) by mouth daily.   simvastatin (ZOCOR) 20 MG tablet Take 1 tablet (20 mg total) by mouth at bedtime.   [DISCONTINUED] albuterol (VENTOLIN HFA) 108 (90 Base) MCG/ACT inhaler INHALE 2 PUFFS INTO THE LUNGS EVERY 6 HOURS AS NEEDED FOR WHEEZING OR SHORTNESS OF BREATH (Patient not taking: Reported on 06/30/2022)   [DISCONTINUED] BLACK CURRANT SEED OIL PO Take 1 capsule by mouth daily at 2 PM.   [DISCONTINUED] Garlic 1000 MG CAPS Take 6,000 mg by mouth daily as needed.   [DISCONTINUED] multivitamin-lutein (OCUVITE-LUTEIN) CAPS capsule Take 1 capsule by mouth daily.   [DISCONTINUED] Omega-3 Fatty Acids (OMEGA-3 FISH OIL) 1200 MG CAPS Take by mouth.    No facility-administered encounter medications on file as of 08/10/2022.    Surgical History: Past Surgical History:  Procedure Laterality Date   ANKLE FRACTURE SURGERY Right 2007   COLONOSCOPY WITH PROPOFOL N/A 05/10/2017   Procedure: COLONOSCOPY WITH PROPOFOL;  Surgeon: Pasty Spillers, MD;  Location: [facility] ENDOSCOPY;  Service: Endoscopy;  Laterality: N/A;   ESOPHAGOGASTRODUODENOSCOPY (EGD) WITH PROPOFOL N/A 10/17/2017   Procedure: ESOPHAGOGASTRODUODENOSCOPY (EGD) WITH PROPOFOL;  Surgeon: Pasty Spillers, MD;  Location: [facility] ENDOSCOPY;  Service: Endoscopy;  Laterality: N/A;   GANGLION CYST EXCISION Right    wrist   HERNIA REPAIR     2011   KNEE ARTHROSCOPY W/ ACL RECONSTRUCTION Right 2008   KNEE SURGERY  08/05/2007   LASIK     NISSEN FUNDOPLICATION  2012    Medical History: Past Medical History:  Diagnosis Date   Coronary artery disease    GERD (gastroesophageal reflux disease)    Glaucoma    Headache    migraines   History of shingles    Hyperlipidemia    Hypertension    Pre-diabetes    Pulmonary embolism on right [facility]) 2007   right lung   Sjogren's syndrome (HCC)    Stroke (HCC) 1998    Family History: Family History  Problem Relation Age of Onset   Congestive Heart Failure Mother    Parkinson's disease Mother    Diabetes Father    Scleroderma Sister    Cancer Brother     Social History   Socioeconomic History   Marital status: Married    Spouse name: Not on file   Number of children: 1   Years of education: Not on file   Highest education level: Some college, no degree  Occupational History   Occupation: retired  Tobacco Use   Smoking status: Never   Smokeless tobacco: Never  Vaping Use   Vaping Use: Never used  Substance and Sexual Activity   Alcohol use: No    Alcohol/week: 0.0 standard drinks of alcohol   Drug use: No   Sexual activity: Not on file  Other Topics Concern   Not on file  Social History Narrative   Not on file    Social Determinants of Health   Financial Resource Strain: Low Risk  (06/14/2017)   Overall Financial Resource Strain (CARDIA)    Difficulty of Paying Living Expenses: Not hard at all  Food Insecurity: No Food Insecurity (12/05/2021)   Hunger Vital Sign    Worried About Running Out of Food in the Last Year: Never true    Ran Out of Food in the Last Year: Never true  Transportation Needs: No Transportation Needs (12/05/2021)   PRAPARE - Administrator, Civil Service (Medical): No    Lack of Transportation (Non-Medical): No  Physical Activity: Not on file  Stress: No Stress Concern Present (06/14/2017)   [employer] of Occupational Health - Occupational Stress Questionnaire    Feeling of Stress : Only a little  Social Connections: Not on file  Intimate Partner Violence: Not At Risk (12/05/2021)   Humiliation, Afraid, Rape, and Kick questionnaire    Fear of Current or Ex-Partner: No    Emotionally Abused: No    Physically Abused: No    Sexually Abused: No      Review of Systems  Constitutional:  Positive for fatigue. Negative for activity change, appetite change, chills, fever and unexpected weight change.  HENT: Negative.  Negative for congestion, ear pain, rhinorrhea, sore throat and trouble swallowing.   Eyes: Negative.   Respiratory: Negative.  Negative for cough, chest tightness, shortness of breath and wheezing.   Cardiovascular: Negative.  Negative for chest pain and palpitations.  Gastrointestinal: Negative.  Negative for abdominal pain, blood in stool, constipation, diarrhea, nausea and vomiting.  Endocrine: Negative.   Genitourinary:  Negative.  Negative for difficulty urinating, dysuria, frequency, hematuria and urgency.  Musculoskeletal: Negative.  Negative for arthralgias, back pain, joint swelling, myalgias and neck pain.  Skin: Negative.  Negative for rash and wound.  Allergic/Immunologic: Negative.  Negative for immunocompromised state.   Neurological: Negative.  Negative for dizziness, seizures, numbness and headaches.  Hematological: Negative.   Psychiatric/Behavioral:  Positive for confusion and decreased concentration. Negative for behavioral problems, self-injury, sleep disturbance and suicidal ideas. The patient is not nervous/anxious.        Memory issues    Vital Signs: BP 130/74   Pulse 97   Temp (!) 97.2 F (36.2 C)   Resp 16   Ht 5\' 6"  (1.676 m)   Wt 186 lb 9.6 oz (84.6 kg)   SpO2 95%   BMI 30.12 kg/m    Physical Exam Vitals reviewed.  Constitutional:      General: She is awake. She is not in acute distress.    Appearance: Normal appearance. She is well-developed and well-groomed. She is not ill-appearing or diaphoretic.  HENT:     Head: Normocephalic and atraumatic.     Right Ear: Tympanic membrane, ear canal and external ear normal.     Left Ear: Tympanic membrane, ear canal and external ear normal.     Nose: Nose normal. No congestion or rhinorrhea.     Mouth/Throat:     Lips: Pink.     Mouth: Mucous membranes are moist.     Pharynx: Oropharynx is clear. Uvula midline. No oropharyngeal exudate or posterior oropharyngeal erythema.  Eyes:     General: Lids are normal. Vision grossly intact. Gaze aligned appropriately. No scleral icterus.       Right eye: No discharge.        Left eye: No discharge.     Extraocular Movements: Extraocular movements intact.     Conjunctiva/sclera: Conjunctivae normal.     Pupils: Pupils are equal, round, and reactive to light.     Funduscopic exam:    Right eye: Red reflex present.        Left eye: Red reflex present. Neck:     Thyroid: No thyromegaly.     Vascular: No JVD.     Trachea: Trachea and phonation normal. No tracheal deviation.  Cardiovascular:     Rate and Rhythm: Normal rate and regular rhythm.     Heart sounds: Normal heart sounds, S1 normal and S2 normal. No murmur heard.    No friction rub. No gallop.  Pulmonary:     Effort: Pulmonary  effort is normal. No accessory muscle usage or respiratory distress.     Breath sounds: Normal breath sounds and air entry. No stridor. No wheezing or rales.  Chest:     Chest wall: No swelling or tenderness.  Breasts:    Right: Normal. No swelling, bleeding, inverted nipple, mass, nipple discharge, skin change or tenderness.     Left: Normal. No swelling, bleeding, inverted nipple, mass, nipple discharge, skin change or tenderness.  Abdominal:     General: Bowel sounds are normal. There is no distension.     Palpations: Abdomen is soft. There is no mass.     Tenderness: There is no abdominal tenderness. There is no guarding or rebound.  Musculoskeletal:        General: No tenderness or deformity. Normal range of motion.     Cervical back: Normal range of motion and neck supple.     Right lower leg: 1+ Pitting Edema present.  Left lower leg: 1+ Pitting Edema present.  Lymphadenopathy:     Cervical: No cervical adenopathy.     Upper Body:     Right upper body: No supraclavicular, axillary or pectoral adenopathy.     Left upper body: No supraclavicular, axillary or pectoral adenopathy.  Skin:    General: Skin is warm and dry.     Capillary Refill: Capillary refill takes less than 2 seconds.     Coloration: Skin is not pale.     Findings: No erythema or rash.  Neurological:     Mental Status: She is alert and oriented to person, place, and time.     Cranial Nerves: No cranial nerve deficit.     Motor: No abnormal muscle tone.     Coordination: Coordination normal.     Gait: Gait normal.     Deep Tendon Reflexes: Reflexes are normal and symmetric.  Psychiatric:        Mood and Affect: Mood and affect normal.        Speech: Speech is delayed.        Behavior: Behavior is slowed. Behavior is cooperative.        Thought Content: Thought content normal. Thought content is not paranoid or delusional. Thought content does not include homicidal or suicidal ideation.        Cognition and  Memory: Memory is impaired. She exhibits impaired recent memory.        Judgment: Judgment normal.        Assessment/Plan: 1. Encounter for routine adult health examination with abnormal findings Age-appropriate preventive screenings and vaccinations discussed, annual physical exam completed. Routine labs for health maintenance results previously discussed, labs were drawn in February. PHM updated.   2. Transient ischemic attack with visual impairment CT head ordered to evaluate for any new evidence of stroke. Consider neurology referral due to impaired memory and changes in vision.  - CT HEAD WO CONTRAST ( ); Future  3. Essential hypertension Stable, continue medications as prescribed.   4. Prediabetes Stable, last A1c is 6.2 in February     General Counseling: Emonni verbalizes understanding of the findings of todays visit and agrees with plan of treatment. I have discussed any further diagnostic evaluation that may be needed or ordered today. We also reviewed her medications today. she has been encouraged to call the office with any questions or concerns that should arise related to todays visit.    Orders Placed This Encounter  Procedures   CT HEAD WO CONTRAST ( )    No orders of the defined types were placed in this encounter.   Return in about 3 months (around 11/10/2022) for F/U, Wynter Isaacs PCP.   Total time spent:30 Minutes Time spent includes review of chart, medications, test results, and follow up plan with the patient.   [place] Controlled Substance Database was reviewed by me.  This patient was seen by [name], FNP-C in collaboration with Dr. Beverely Risen as a part of collaborative care agreement.  Danaysha Kirn R. Tedd Sias, MSN, FNP-C Internal medicine

## 2022-08-10 NOTE — Telephone Encounter (Signed)
The patient called stating yesterday she had chest pains that went into her back. She took some aspirin and it went away. She also states she did not recognized some of the people at the doctor's office that she met before. She went to her primary care doctor today and states that she thinks maybe the patient had a TIA. I told her I will let Dr. Mariah Milling know. Pt had no episodes on her loop recorder.

## 2022-08-11 ENCOUNTER — Telehealth: Payer: Self-pay | Admitting: Nurse Practitioner

## 2022-08-11 NOTE — Telephone Encounter (Signed)
Notified patient of CT appointment date, arrival time, location-Virginia Phillips 

## 2022-08-14 ENCOUNTER — Ambulatory Visit
Admission: RE | Admit: 2022-08-14 | Discharge: 2022-08-14 | Disposition: A | Discharge status: Home / Self Care | Payer: Medicare Other | Source: Ambulatory Visit | Attending: Nurse Practitioner | Admitting: Nurse Practitioner

## 2022-08-14 ENCOUNTER — Telehealth: Payer: Self-pay

## 2022-08-14 DIAGNOSIS — I6381 Other cerebral infarction due to occlusion or stenosis of small artery: Secondary | ICD-10-CM | POA: Diagnosis not present

## 2022-08-14 DIAGNOSIS — H547 Unspecified visual loss: Secondary | ICD-10-CM | POA: Insufficient documentation

## 2022-08-14 DIAGNOSIS — R238 Other skin changes: Secondary | ICD-10-CM

## 2022-08-14 DIAGNOSIS — G459 Transient cerebral ischemic attack, unspecified: Secondary | ICD-10-CM | POA: Diagnosis not present

## 2022-08-14 MED ORDER — MUPIROCIN 2 % EX OINT
TOPICAL_OINTMENT | Freq: Every day | CUTANEOUS | Status: DC
Start: 2022-08-14 — End: 2023-09-14

## 2022-08-14 NOTE — Telephone Encounter (Signed)
Patient called to report skin irritation has not improved with Neosporen. Sent Mupirocin per Alyssa.

## 2022-08-16 NOTE — Telephone Encounter (Signed)
Called and spoke patient. Informed the patient of Dr. Windell Hummingbird following recommendations.    Pending her possible TIA, would recommend she talk with Dr. Malvin Johns her neurologist She may want to consider retrying aspirin and Plavix rather than aspirin alone given prior history of stroke  We have done workup for chest pain for similar symptoms in the past, cardiac CTA done short time ago showed no significant stenosis Unlikely that a new stenosis would have developed in such a short period of time We can move up her appointment if she would like to discuss Thx TG   Patient verbalizes understanding. Patient states that she will call Dr. Daisy Blossom office and that she does not feel like she needs to move her appointment up with Dr. Mariah Milling.

## 2022-08-18 NOTE — Progress Notes (Signed)
Please let the patient know that there is no new or acute changes or abnormalities on her head CT. 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 There are also some microvascular changes which are consistent with TIAs

## 2022-08-22 ENCOUNTER — Telehealth: Payer: Self-pay

## 2022-08-22 NOTE — Telephone Encounter (Signed)
-----   Message from [name], NP sent at 08/18/2022  1:07 PM EDT ----- Please let the patient know that there is no new or acute changes or abnormalities on her head CT. 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 There are also some microvascular changes which are consistent with TIAs

## 2022-08-22 NOTE — Telephone Encounter (Signed)
Patient was notified of her CT results and asking for a copy to mailed to her.

## 2022-09-05 ENCOUNTER — Ambulatory Visit (INDEPENDENT_AMBULATORY_CARE_PROVIDER_SITE_OTHER): Payer: Medicare Other

## 2022-09-05 DIAGNOSIS — I639 Cerebral infarction, unspecified: Secondary | ICD-10-CM

## 2022-09-06 DIAGNOSIS — R29898 Other symptoms and signs involving the musculoskeletal system: Secondary | ICD-10-CM | POA: Diagnosis not present

## 2022-09-06 DIAGNOSIS — R519 Headache, unspecified: Secondary | ICD-10-CM | POA: Diagnosis not present

## 2022-09-06 DIAGNOSIS — Z8673 Personal history of transient ischemic attack (TIA), and cerebral infarction without residual deficits: Secondary | ICD-10-CM | POA: Diagnosis not present

## 2022-09-06 LAB — CUP PACEART REMOTE DEVICE CHECK
Date Time Interrogation Session: 20240708232114
Implantable Pulse Generator Implant Date: 20240605

## 2022-09-11 NOTE — Progress Notes (Signed)
Cardiology Office Note   Date:  09/18/2022   ID:  Virginia Phillips, Virginia Phillips 10/14/1945, [name]  PCP:  [name], NP  Cardiologist:   [name], MD   Chief Complaint  Patient presents with   Follow-up    Patient reports "horrible" headaches for 2 days last week.  Had right elbow pain last week that was improved after 3 days of Tylemol and Biofreeze use.  Pain was so bad she thought about going to hospital.    History of Present Illness: Virginia Phillips is a 77 y.o. female who presents for  Nonsmoker, second hand 3 ppd Borderline diabetes Hypertension Ankle surgery, debility Obstructive sleep apnea Carotid disease, aortic atherosclerosis per outside cardiology records not available  Nonobstructive CAD on CT scan 6/22, calcium scoer 231 presenting for f/u of her chest pain symptoms, palpitations  Last seen by myself in clinic January 2024 Seen by EP August 02, 2022 admitted in October 2023 for an acute stroke  Loop monitor implanted  Echo June 2024 EF 55 to 60% Bubble study positive in the past October 2023  BP well controlled  Lab work reviewed A1C 6.2  Goes to sleep 2 AM,  Wakes 2 pm Sedentary, no regular exercise or walking program Legs are weak, difficulty moving around Has an exercise bike but does not use it  Prior history of stroke and TIA Not on plavix, feels it causes dizziness Started taking asa 325  Bothered by hammertoes, was debating whether to do surgery   admitted in October 2023 for acute stroke.  left arm weakness.   MRI showed small punctate acute infarct.   work-up was unrevealing  Echo bubble study showed LVEF 55-60%, G1DD, positive with shunting observed  discharged on DAPT with aspirin and Plavix x3 weeks followed by Plavix monotherapy indefinitely.  Statin was added to home medications.   Zio monitor December 2023, no significant arrhythmia Rare short runs narrow complex tachycardia/SVT  Lab work reviewed A1c 6.0 LDL  107 Creatinine  0.8 BUN 14 Hemoglobin 14.5  Cardiac CTA,  Nonobstructive disease noted, calcium score 260  Other past medical history reviewed Echo   1. Left ventricular ejection fraction, by estimation, is 60 to 65%. The  left ventricle has normal function. Left ventricular endocardial border  not optimally defined to evaluate regional wall motion. There is mild left  ventricular hypertrophy. Left  ventricular diastolic parameters are consistent with Grade I diastolic  dysfunction (impaired relaxation).   2. Right ventricular systolic function is normal. The right ventricular  size is normal.   Severe chest pain,  08/09/2020,    Past Medical History:  Diagnosis Date   Coronary artery disease    GERD (gastroesophageal reflux disease)    Glaucoma    Headache    migraines   History of shingles    Hyperlipidemia    Hypertension    Pre-diabetes    Pulmonary embolism on right [facility]) 2007   right lung   Sjogren's syndrome (HCC)    Stroke (HCC) 1998    Past Surgical History:  Procedure Laterality Date   ANKLE FRACTURE SURGERY Right 2007   COLONOSCOPY WITH PROPOFOL N/A 05/10/2017   Procedure: COLONOSCOPY WITH PROPOFOL;  Surgeon: Pasty Spillers, MD;  Location: [facility] ENDOSCOPY;  Service: Endoscopy;  Laterality: N/A;   ESOPHAGOGASTRODUODENOSCOPY (EGD) WITH PROPOFOL N/A 10/17/2017   Procedure: ESOPHAGOGASTRODUODENOSCOPY (EGD) WITH PROPOFOL;  Surgeon: Pasty Spillers, MD;  Location: [facility] ENDOSCOPY;  Service: Endoscopy;  Laterality: N/A;  GANGLION CYST EXCISION Right    wrist   HERNIA REPAIR     2011   KNEE ARTHROSCOPY W/ ACL RECONSTRUCTION Right 2008   KNEE SURGERY  08/05/2007   LASIK     NISSEN FUNDOPLICATION  2012     Current Outpatient Medications  Medication Sig Dispense Refill   acetaminophen (TYLENOL) 650 MG CR tablet Take 650 mg by mouth every 8 (eight) hours as needed for pain.     amLODipine (NORVASC) 5 MG tablet TAKE 1 TABLET(5 MG) BY MOUTH DAILY 90  tablet 3   aspirin 325 MG tablet Take 325 mg by mouth daily.     aspirin-acetaminophen-caffeine (EXCEDRIN MIGRAINE) 250-250-65 MG tablet Take 2 tablets by mouth daily as needed for headache or migraine.     aspirin-sod bicarb-citric acid (ALKA-SELTZER) 325 MG TBEF tablet Take 650 mg by mouth every 6 (six) hours as needed (indigestion).     Biotin 5 MG CAPS Take 5,000 mg by mouth daily at 2 PM.     Cholecalciferol (VITAMIN D) 2000 UNITS CAPS Take 2,000 Units by mouth daily at 2 PM.      Coenzyme Q10 (CO Q10) 200 MG CAPS Take 200 mg by mouth daily at 2 PM.      ezetimibe (ZETIA) 10 MG tablet Take 1 tablet (10 mg total) by mouth daily. 90 tablet 2   Lancets (ONETOUCH ULTRASOFT) lancets Check sugar once daily DX 73.9 needs for one touch ultra 2 lancets 50 each 12   lubiprostone (AMITIZA) 8 MCG capsule TAKE ONE CAPSULE BY MOUTH TWICE DAILY WITH A MEAL 180 capsule 3   Magnesium 400 MG TABS Take by mouth daily.     Misc Natural Products (PETADOLEX 50) 50 MG CAPS Take 100 mg by mouth daily.     multivitamin-lutein (OCUVITE-LUTEIN) CAPS capsule Take 1 capsule by mouth daily.     omeprazole (PRILOSEC OTC) 20 MG tablet Take 20 mg by mouth daily.     omeprazole (PRILOSEC) 20 MG capsule Take 1 capsule (20 mg total) by mouth daily.     ONE TOUCH ULTRA TEST test strip CHECK BLOOD SUGAR ONCE DAILY AS DIRECTED 100 each 3   pilocarpine (SALAGEN) 5 MG tablet Take 5 mg by mouth 2 (two) times daily.     polyethylene glycol (MIRALAX / GLYCOLAX) packet Take 17 g by mouth daily.      Polyvinyl Alcohol-Povidone (REFRESH OP) Place 1 drop into both eyes daily as needed (dry eyes).     pyridOXINE (VITAMIN B-6) 100 MG tablet Take 100 mg by mouth daily at 2 PM.     Riboflavin (B2) 100 MG TABS Take 100 mg by mouth 2 (two) times daily.     Thiamine HCl (VITAMIN B-1) 250 MG tablet Take 250 mg by mouth daily at 2 PM.     valsartan (DIOVAN) 80 MG tablet TAKE 1 TABLET(80 MG) BY MOUTH EVERY DAY 90 tablet 2   venlafaxine XR  (EFFEXOR-XR) 75 MG 24 hr capsule TAKE 1 CAPSULE BY MOUTH DAILY AT 2 PM 90 capsule 0   vitamin B-12 (CYANOCOBALAMIN) 1000 MCG tablet Take 1,000 mcg by mouth daily at 2 PM.     vitamin C (ASCORBIC ACID) 500 MG tablet Take 500 mg by mouth daily at 2 PM.     ibuprofen (ADVIL,MOTRIN) 200 MG tablet Take 400 mg by mouth daily as needed for headache or moderate pain.  (Patient not taking: Reported on 09/18/2022)     simvastatin (ZOCOR) 20 MG tablet Take 1  tablet (20 mg total) by mouth at bedtime. 90 tablet 2   Current Facility-Administered Medications  Medication Dose Route Frequency Provider Last Rate Last Admin   mupirocin ointment (BACTROBAN) 2 %   Topical Daily Abernathy, Alyssa, NP        Allergies:   Linzess [linaclotide], Amoxicillin, Blue dyes (parenteral), Butalbital-aspirin-caffeine, Clindamycin/lincomycin, Clobetasol, Dexlansoprazole, Diphenhydramine, Doxycycline, Esomeprazole, Gabapentin, Green dye, Iodinated contrast media, Meloxicam, Metronidazole, Nortriptyline hcl, Prednisolone acetate, Tetracycline, Baby oil, Cortisone, Diphenhydramine hcl, Levofloxacin, Mineral oil, Prednisone, and Sulfa antibiotics   Social History:  The patient  reports that she has never smoked. She has never used smokeless tobacco. She reports that she does not drink alcohol and does not use drugs.   Family History:  The patient's family history includes Cancer in her brother; Congestive Heart Failure in her mother; Diabetes in her father; Parkinson's disease in her mother; Scleroderma in her sister.    ROS:   Review of Systems  Constitutional: Negative.   HENT: Negative.    Respiratory: Negative.    Cardiovascular:  Positive for palpitations and leg swelling.  Gastrointestinal: Negative.   Musculoskeletal: Negative.   Neurological: Negative.   Psychiatric/Behavioral: Negative.    All other systems reviewed and are negative.   PHYSICAL EXAM: VS:  BP 108/74 (BP Location: Left Arm, Patient Position:  Sitting, Cuff Size: Large)   Pulse 77   Ht 5\' 6"  (1.676 m)   Wt 186 lb 9.6 oz (84.6 kg)   SpO2 95%   BMI 30.12 kg/m  , BMI Body mass index is 30.12 kg/m. Constitutional:  oriented to person, place, and time. No distress.  HENT:  Head: Grossly normal Eyes:  no discharge. No scleral icterus.  Neck: No JVD, no carotid bruits  Cardiovascular: Regular rate and rhythm, no murmurs appreciated Pulmonary/Chest: Clear to auscultation bilaterally, no wheezes or rails Abdominal: Soft.  no distension.  no tenderness.  Musculoskeletal: Normal range of motion Neurological:  normal muscle tone. Coordination normal. No atrophy Skin: Skin warm and dry Psychiatric: normal affect, pleasant   Recent Labs: 04/06/2022: ALT 15; Magnesium 2.2 06/16/2022: BUN 15; Creatinine, Ser 0.88; Hemoglobin 14.7; Platelets 266; Potassium 4.0; Sodium 137 06/30/2022: B Natriuretic Peptide 10.4    Lipid Panel    Component Value Date/Time   CHOL 163 04/06/2022 1345   CHOL 166 03/06/2012 1512   TRIG 189 (H) 04/06/2022 1345   TRIG 93 03/06/2012 1512   HDL 62 04/06/2022 1345   HDL 76 (H) 03/06/2012 1512   CHOLHDL 2.6 04/06/2022 1345   CHOLHDL 2.9 12/14/2016 1339   VLDL 19 03/06/2012 1512   LDLCALC 70 04/06/2022 1345   LDLCALC 96 12/14/2016 1339   LDLCALC 71 03/06/2012 1512   LDLDIRECT 107 (H) 12/05/2021 0803      Wt Readings from Last 3 Encounters:  09/18/22 186 lb 9.6 oz (84.6 kg)  08/10/22 186 lb 9.6 oz (84.6 kg)  08/02/22 184 lb (83.5 kg)         08/12/2020   11:34 AM  PAD Screen  Previous PAD dx? No  Previous surgical procedure? No  Pain with walking? Yes  Subsides with rest? Yes  Feet/toe relief with dangling? No  Painful, non-healing ulcers? No  Extremities discolored? No    ASSESSMENT AND PLAN:  Chest pain/angina Cardiac CTA showing nonobstructive disease Echocardiogram normal ejection fraction Denies significant chest pain, no further ischemic workup needed  Essential  hypertension Blood pressure is well controlled on today's visit. No changes made to the medications.  Leg swelling  Denies leg swelling on today's visit  Hyperlipidemia Tolerating simvastatin and Zetia, total cholesterol 160  Coronary disease Nonobstructive disease on cardiac CTA Recommend aspirin 81 but she prefers to be on 325 given prior history of stroke Does not want to be on Plavix, side effect of lightheadedness by her report Long discussion concerning need to start a walking/exercise program  Paroxysmal tachycardia Denies any symptoms    Total encounter time more than 30 minutes  Greater than 50% was spent in counseling and coordination of care with the patient   Signed,  [name], MD  09/18/2022 11:56 AM    [facility]

## 2022-09-18 ENCOUNTER — Encounter: Payer: Self-pay | Admitting: Cardiovascular Disease

## 2022-09-18 ENCOUNTER — Ambulatory Visit: Payer: Medicare Other | Attending: Cardiovascular Disease | Admitting: Cardiovascular Disease

## 2022-09-18 VITALS — BP 108/74 | HR 77 | Ht 66.0 in | Wt 186.6 lb

## 2022-09-18 DIAGNOSIS — E782 Mixed hyperlipidemia: Secondary | ICD-10-CM | POA: Insufficient documentation

## 2022-09-18 DIAGNOSIS — I479 Paroxysmal tachycardia, unspecified: Secondary | ICD-10-CM | POA: Diagnosis not present

## 2022-09-18 DIAGNOSIS — I10 Essential (primary) hypertension: Secondary | ICD-10-CM | POA: Insufficient documentation

## 2022-09-18 DIAGNOSIS — R0609 Other forms of dyspnea: Secondary | ICD-10-CM | POA: Insufficient documentation

## 2022-09-18 DIAGNOSIS — Q2112 Patent foramen ovale: Secondary | ICD-10-CM | POA: Insufficient documentation

## 2022-09-18 DIAGNOSIS — I2510 Atherosclerotic heart disease of native coronary artery without angina pectoris: Secondary | ICD-10-CM | POA: Diagnosis not present

## 2022-09-18 DIAGNOSIS — I639 Cerebral infarction, unspecified: Secondary | ICD-10-CM | POA: Insufficient documentation

## 2022-09-18 MED ORDER — EZETIMIBE 10 MG PO TABS: 10.0000 mg | ORAL_TABLET | Freq: Every day | ORAL | 2 refills | Status: DC

## 2022-09-18 MED ORDER — SIMVASTATIN 20 MG PO TABS: 20.0000 mg | ORAL_TABLET | Freq: Every day | ORAL | 2 refills | Status: DC

## 2022-09-18 MED ORDER — VALSARTAN 80 MG PO TABS: ORAL_TABLET | ORAL | 2 refills | Status: DC

## 2022-09-18 MED ORDER — AMLODIPINE BESYLATE 5 MG PO TABS
5.0000 mg | ORAL_TABLET | Freq: Every day | ORAL | 3 refills | Status: DC
Start: 2022-09-18 — End: 2022-11-22

## 2022-09-18 NOTE — Patient Instructions (Signed)
Walking program daily   Medication Instructions:  No changes  If you need a refill on your cardiac medications before your next appointment, please call your pharmacy.   Lab work: No new labs needed  Testing/Procedures: No new testing needed  Follow-Up: At [facility], you and your health needs are our priority.  As part of our continuing mission to provide you with exceptional heart care, we have created designated Provider Care Teams.  These Care Teams include your primary Cardiologist (physician) and Advanced Practice Providers (APPs -  Physician Assistants and Nurse Practitioners) who all work together to provide you with the care you need, when you need it.  You will need a follow up appointment in 12 months  Providers on your designated Care Team:   Nicolasa Ducking, NP Eula Listen, PA-C Cadence Fransico Michael, [place]  COVID-19 Vaccine Information can be found at: [email or web address] For questions related to vaccine distribution or appointments, please email [email or web address] or call 3160893323.

## 2022-09-28 NOTE — Progress Notes (Signed)
Carelink Summary Report / Loop Recorder 

## 2022-10-08 LAB — CUP PACEART REMOTE DEVICE CHECK
Date Time Interrogation Session: 20240810232115
Implantable Pulse Generator Implant Date: 20240605

## 2022-10-09 ENCOUNTER — Ambulatory Visit: Payer: Medicare Other

## 2022-10-09 DIAGNOSIS — I639 Cerebral infarction, unspecified: Secondary | ICD-10-CM

## 2022-10-23 NOTE — Progress Notes (Signed)
Carelink Summary Report / Loop Recorder 

## 2022-11-03 ENCOUNTER — Telehealth: Payer: Self-pay | Admitting: Nurse Practitioner

## 2022-11-03 ENCOUNTER — Other Ambulatory Visit: Payer: Self-pay

## 2022-11-03 DIAGNOSIS — N959 Unspecified menopausal and perimenopausal disorder: Secondary | ICD-10-CM

## 2022-11-03 MED ORDER — VENLAFAXINE HCL ER 75 MG PO CP24
ORAL_CAPSULE | ORAL | 0 refills | Status: DC
Start: 2022-11-03 — End: 2022-11-22

## 2022-11-03 NOTE — Telephone Encounter (Signed)
Pt advised we send med  

## 2022-11-03 NOTE — Telephone Encounter (Signed)
Mailed out receipt to pt for paid bill

## 2022-11-12 LAB — CUP PACEART REMOTE DEVICE CHECK
Date Time Interrogation Session: 20240912232108
Implantable Pulse Generator Implant Date: 20240605

## 2022-11-13 ENCOUNTER — Ambulatory Visit (INDEPENDENT_AMBULATORY_CARE_PROVIDER_SITE_OTHER): Payer: Medicare Other

## 2022-11-13 DIAGNOSIS — I639 Cerebral infarction, unspecified: Secondary | ICD-10-CM | POA: Diagnosis not present

## 2022-11-16 ENCOUNTER — Encounter: Payer: Self-pay | Admitting: Nurse Practitioner

## 2022-11-16 ENCOUNTER — Ambulatory Visit: Payer: Medicare Other | Admitting: Nurse Practitioner

## 2022-11-16 VITALS — BP 122/65 | HR 92 | Temp 98.1°F | Resp 16 | Ht 66.0 in | Wt 188.0 lb

## 2022-11-22 ENCOUNTER — Encounter: Payer: Self-pay | Admitting: Nurse Practitioner

## 2022-11-22 ENCOUNTER — Ambulatory Visit (INDEPENDENT_AMBULATORY_CARE_PROVIDER_SITE_OTHER): Payer: Medicare Other | Admitting: Nurse Practitioner

## 2022-11-22 VITALS — BP 135/73 | HR 85 | Temp 98.1°F | Resp 16 | Ht 66.0 in | Wt 186.4 lb

## 2022-11-22 DIAGNOSIS — E2839 Other primary ovarian failure: Secondary | ICD-10-CM | POA: Diagnosis not present

## 2022-11-22 DIAGNOSIS — R7303 Prediabetes: Secondary | ICD-10-CM | POA: Diagnosis not present

## 2022-11-22 DIAGNOSIS — E782 Mixed hyperlipidemia: Secondary | ICD-10-CM | POA: Diagnosis not present

## 2022-11-22 DIAGNOSIS — E538 Deficiency of other specified B group vitamins: Secondary | ICD-10-CM | POA: Diagnosis not present

## 2022-11-22 DIAGNOSIS — N959 Unspecified menopausal and perimenopausal disorder: Secondary | ICD-10-CM

## 2022-11-22 MED ORDER — VENLAFAXINE HCL ER 75 MG PO CP24
ORAL_CAPSULE | ORAL | 1 refills | Status: AC
Start: 2022-11-22 — End: ?

## 2022-11-22 NOTE — Progress Notes (Unsigned)
[facility] [address] [place], [place] [address]  Internal MEDICINE  Office Visit Note  Patient Name: Virginia Phillips  [id number]  [id number]  Date of Service: 11/22/2022  Chief Complaint  Patient presents with  . Follow-up  . Gastroesophageal Reflux  . Hypertension  . Hyperlipidemia    HPI Samantha presents for a follow-up visit for leg pains, hypertension, high cholesterol, and prediabetes.  Legs are bothering her again, feels like they are giving out on her. Happened before but never had it looked into. Feels like she has had a couple more strokes when she started having trouble with her memory and increased trouble with walking.  Hypertension -- controlled with current medication Hyperlipidemia Prediabetes     Current Medication: Outpatient Encounter Medications as of 11/22/2022  Medication Sig  . acetaminophen (TYLENOL) 650 MG CR tablet Take 650 mg by mouth every 8 (eight) hours as needed for pain.  Marland Kitchen aspirin 325 MG tablet Take 325 mg by mouth daily.  Marland Kitchen aspirin-acetaminophen-caffeine (EXCEDRIN MIGRAINE) 250-250-65 MG tablet Take 2 tablets by mouth daily as needed for headache or migraine.  Marland Kitchen aspirin-sod bicarb-citric acid (ALKA-SELTZER) 325 MG TBEF tablet Take 650 mg by mouth every 6 (six) hours as needed (indigestion).  . Biotin 5 MG CAPS Take 5,000 mg by mouth daily at 2 PM.  . Cholecalciferol (VITAMIN D) 2000 UNITS CAPS Take 2,000 Units by mouth daily at 2 PM.   . Coenzyme Q10 (CO Q10) 200 MG CAPS Take 200 mg by mouth daily at 2 PM.   . ezetimibe (ZETIA) 10 MG tablet Take 1 tablet (10 mg total) by mouth daily.  Marland Kitchen ibuprofen (ADVIL,MOTRIN) 200 MG tablet Take 400 mg by mouth daily as needed for headache or moderate pain.  . Lancets (ONETOUCH ULTRASOFT) lancets Check sugar once daily DX 73.9 needs for one touch ultra 2 lancets  . lubiprostone (AMITIZA) 8 MCG capsule TAKE ONE CAPSULE BY MOUTH TWICE DAILY WITH A MEAL  . Magnesium 400 MG TABS Take by mouth daily.   . Misc Natural Products (PETADOLEX 50) 50 MG CAPS Take 100 mg by mouth daily.  . multivitamin-lutein (OCUVITE-LUTEIN) CAPS capsule Take 1 capsule by mouth daily.  Marland Kitchen omeprazole (PRILOSEC OTC) 20 MG tablet Take 20 mg by mouth daily.  . ONE TOUCH ULTRA TEST test strip CHECK BLOOD SUGAR ONCE DAILY AS DIRECTED  . pilocarpine (SALAGEN) 5 MG tablet Take 5 mg by mouth 2 (two) times daily.  . polyethylene glycol (MIRALAX / GLYCOLAX) packet Take 17 g by mouth daily.   . Polyvinyl Alcohol-Povidone (REFRESH OP) Place 1 drop into both eyes daily as needed (dry eyes).  . pyridOXINE (VITAMIN B-6) 100 MG tablet Take 100 mg by mouth daily at 2 PM.  . Riboflavin (B2) 100 MG TABS Take 100 mg by mouth 2 (two) times daily.  . simvastatin (ZOCOR) 20 MG tablet Take 1 tablet (20 mg total) by mouth at bedtime.  . Thiamine HCl (VITAMIN B-1) 250 MG tablet Take 250 mg by mouth daily at 2 PM.  . valsartan (DIOVAN) 80 MG tablet TAKE 1 TABLET(80 MG) BY MOUTH EVERY DAY  . vitamin B-12 (CYANOCOBALAMIN) 1000 MCG tablet Take 1,000 mcg by mouth daily at 2 PM.  . vitamin C (ASCORBIC ACID) 500 MG tablet Take 500 mg by mouth daily at 2 PM.  . [DISCONTINUED] amLODipine (NORVASC) 5 MG tablet Take 1 tablet (5 mg total) by mouth daily.  . [DISCONTINUED] venlafaxine XR (EFFEXOR-XR) 75 MG 24 hr capsule TAKE  1 CAPSULE BY MOUTH DAILY AT 2 PM  . venlafaxine XR (EFFEXOR-XR) 75 MG 24 hr capsule TAKE 1 CAPSULE BY MOUTH DAILY AT 2 PM   Facility-Administered Encounter Medications as of 11/22/2022  Medication  . mupirocin ointment (BACTROBAN) 2 %    Surgical History: Past Surgical History:  Procedure Laterality Date  . ANKLE FRACTURE SURGERY Right 2007  . COLONOSCOPY WITH PROPOFOL N/A 05/10/2017   Procedure: COLONOSCOPY WITH PROPOFOL;  Surgeon: Pasty Spillers, MD;  Location: [facility] ENDOSCOPY;  Service: Endoscopy;  Laterality: N/A;  . ESOPHAGOGASTRODUODENOSCOPY (EGD) WITH PROPOFOL N/A 10/17/2017   Procedure: ESOPHAGOGASTRODUODENOSCOPY  (EGD) WITH PROPOFOL;  Surgeon: Pasty Spillers, MD;  Location: [facility] ENDOSCOPY;  Service: Endoscopy;  Laterality: N/A;  . GANGLION CYST EXCISION Right    wrist  . HERNIA REPAIR     2011  . KNEE ARTHROSCOPY W/ ACL RECONSTRUCTION Right 2008  . KNEE SURGERY  08/05/2007  . LASIK    . NISSEN FUNDOPLICATION  2012    Medical History: Past Medical History:  Diagnosis Date  . Coronary artery disease   . GERD (gastroesophageal reflux disease)   . Glaucoma   . Headache    migraines  . History of shingles   . Hyperlipidemia   . Hypertension   . Pre-diabetes   . Pulmonary embolism on right [facility]) 2007   right lung  . Sjogren's syndrome (HCC)   . Stroke [facility]) 1998    Family History: Family History  Problem Relation Age of Onset  . Congestive Heart Failure Mother   . Parkinson's disease Mother   . Diabetes Father   . Scleroderma Sister   . Cancer Brother     Social History   Socioeconomic History  . Marital status: Married    Spouse name: Not on file  . Number of children: 1  . Years of education: Not on file  . Highest education level: Some college, no degree  Occupational History  . Occupation: retired  Tobacco Use  . Smoking status: Never  . Smokeless tobacco: Never  Vaping Use  . Vaping status: Never Used  Substance and Sexual Activity  . Alcohol use: No    Alcohol/week: 0.0 standard drinks of alcohol  . Drug use: No  . Sexual activity: Not on file  Other Topics Concern  . Not on file  Social History Narrative  . Not on file   Social Determinants of Health   Financial Resource Strain: Low Risk  (06/14/2017)   Overall Financial Resource Strain (CARDIA)   . Difficulty of Paying Living Expenses: Not hard at all  Food Insecurity: No Food Insecurity (12/05/2021)   Hunger Vital Sign   . Worried About Programme researcher, broadcasting/film/video in the Last Year: Never true   . Ran Out of Food in the Last Year: Never true  Transportation Needs: No Transportation Needs (12/05/2021)    PRAPARE - Transportation   . Lack of Transportation (Medical): No   . Lack of Transportation (Non-Medical): No  Physical Activity: Not on file  Stress: No Stress Concern Present (06/14/2017)   [employer] of Occupational Health - Occupational Stress Questionnaire   . Feeling of Stress : Only a little  Social Connections: Not on file  Intimate Partner Violence: Not At Risk (12/05/2021)   Humiliation, Afraid, Rape, and Kick questionnaire   . Fear of Current or Ex-Partner: No   . Emotionally Abused: No   . Physically Abused: No   . Sexually Abused: No  Review of Systems  Vital Signs: BP 135/73   Pulse 85   Temp 98.1 F (36.7 C)   Resp 16   Ht 5\' 6"  (1.676 m)   Wt 186 lb 6.4 oz (84.6 kg)   SpO2 95%   BMI 30.09 kg/m    Physical Exam     Assessment/Plan:   General Counseling: Oceanna verbalizes understanding of the findings of todays visit and agrees with plan of treatment. I have discussed any further diagnostic evaluation that may be needed or ordered today. We also reviewed her medications today. she has been encouraged to call the office with any questions or concerns that should arise related to todays visit.    Orders Placed This Encounter  Procedures  . Lipid Profile  . CBC with Differential/Platelet  . CMP14+EGFR  . B12 and Folate Panel  . Hgb A1C w/o eAG  . Iron, TIBC and Ferritin Panel    Meds ordered this encounter  Medications  . venlafaxine XR (EFFEXOR-XR) 75 MG 24 hr capsule    Sig: TAKE 1 CAPSULE BY MOUTH DAILY AT 2 PM    Dispense:  90 capsule    Refill:  1    For future refills    No follow-ups on file.   Total time spent:*** Minutes Time spent includes review of chart, medications, test results, and follow up plan with the patient.   [place] Controlled Substance Database was reviewed by me.  This patient was seen by [name], FNP-C in collaboration with Dr. Beverely Risen as a part of collaborative care agreement.   Gizelle Whetsel R.  Tedd Sias, MSN, FNP-C Internal medicine

## 2022-11-27 DIAGNOSIS — E538 Deficiency of other specified B group vitamins: Secondary | ICD-10-CM | POA: Diagnosis not present

## 2022-11-27 DIAGNOSIS — E782 Mixed hyperlipidemia: Secondary | ICD-10-CM | POA: Diagnosis not present

## 2022-11-27 DIAGNOSIS — D500 Iron deficiency anemia secondary to blood loss (chronic): Secondary | ICD-10-CM | POA: Diagnosis not present

## 2022-11-27 DIAGNOSIS — E611 Iron deficiency: Secondary | ICD-10-CM | POA: Diagnosis not present

## 2022-11-27 DIAGNOSIS — R7303 Prediabetes: Secondary | ICD-10-CM | POA: Diagnosis not present

## 2022-11-27 NOTE — Progress Notes (Signed)
Carelink Summary Report / Loop Recorder 

## 2022-11-28 LAB — HGB A1C W/O EAG: Hgb A1c MFr Bld: 6.4 % — ABNORMAL HIGH (ref 4.8–5.6)

## 2022-11-28 LAB — CMP14+EGFR
ALT: 23 [IU]/L (ref 0–32)
AST: 23 [IU]/L (ref 0–40)
Albumin: 4.4 g/dL (ref 3.8–4.8)
Alkaline Phosphatase: 62 [IU]/L (ref 44–121)
BUN/Creatinine Ratio: 12 (ref 12–28)
BUN: 11 mg/dL (ref 8–27)
Bilirubin Total: 0.3 mg/dL (ref 0.0–1.2)
CO2: 25 mmol/L (ref 20–29)
Calcium: 9.8 mg/dL (ref 8.7–10.3)
Chloride: 101 mmol/L (ref 96–106)
Creatinine, Ser: 0.95 mg/dL (ref 0.57–1.00)
Globulin, Total: 2.4 g/dL (ref 1.5–4.5)
Glucose: 115 mg/dL — ABNORMAL HIGH (ref 70–99)
Potassium: 4.6 mmol/L (ref 3.5–5.2)
Sodium: 140 mmol/L (ref 134–144)
Total Protein: 6.8 g/dL (ref 6.0–8.5)
eGFR: 62 mL/min/{1.73_m2} (ref >59)

## 2022-11-28 LAB — CBC WITH DIFFERENTIAL/PLATELET
Basophils Absolute: 0.1 10*3/uL (ref 0.0–0.2)
Basos: 1 %
EOS (ABSOLUTE): 0.2 10*3/uL (ref 0.0–0.4)
Eos: 3 %
Hematocrit: 45.8 % (ref 34.0–46.6)
Hemoglobin: 14.8 g/dL (ref 11.1–15.9)
Immature Grans (Abs): 0 10*3/uL (ref 0.0–0.1)
Immature Granulocytes: 0 %
Lymphocytes Absolute: 2.5 10*3/uL (ref 0.7–3.1)
Lymphs: 32 %
MCH: 31.1 pg (ref 26.6–33.0)
MCHC: 32.3 g/dL (ref 31.5–35.7)
MCV: 96 fL (ref 79–97)
Monocytes Absolute: 0.7 10*3/uL (ref 0.1–0.9)
Monocytes: 9 %
Neutrophils Absolute: 4.2 10*3/uL (ref 1.4–7.0)
Neutrophils: 55 %
Platelets: 264 10*3/uL (ref 150–450)
RBC: 4.76 x10E6/uL (ref 3.77–5.28)
RDW: 12.1 % (ref 11.7–15.4)
WBC: 7.7 10*3/uL (ref 3.4–10.8)

## 2022-11-28 LAB — LIPID PANEL
Chol/HDL Ratio: 3.3 {ratio} (ref 0.0–4.4)
Cholesterol, Total: 195 mg/dL (ref 100–199)
HDL: 59 mg/dL (ref >39)
LDL Chol Calc (NIH): 99 mg/dL (ref 0–99)
Triglycerides: 218 mg/dL — ABNORMAL HIGH (ref 0–149)
VLDL Cholesterol Cal: 37 mg/dL (ref 5–40)

## 2022-11-28 LAB — B12 AND FOLATE PANEL
Folate: 14.2 ng/mL (ref >3.0)
Vitamin B-12: 894 pg/mL (ref 232–1245)

## 2022-11-28 LAB — IRON,TIBC AND FERRITIN PANEL
Ferritin: 60 ng/mL (ref 15–150)
Iron Saturation: 32 % (ref 15–55)
Iron: 92 ug/dL (ref 27–139)
Total Iron Binding Capacity: 287 ug/dL (ref 250–450)
UIBC: 195 ug/dL (ref 118–369)

## 2022-12-11 ENCOUNTER — Other Ambulatory Visit: Payer: Self-pay | Admitting: Cardiovascular Disease

## 2022-12-18 ENCOUNTER — Ambulatory Visit: Payer: Medicare Other

## 2022-12-18 DIAGNOSIS — I639 Cerebral infarction, unspecified: Secondary | ICD-10-CM

## 2022-12-19 LAB — CUP PACEART REMOTE DEVICE CHECK
Date Time Interrogation Session: 20241020231854
Implantable Pulse Generator Implant Date: 20240605

## 2022-12-25 DIAGNOSIS — Z8673 Personal history of transient ischemic attack (TIA), and cerebral infarction without residual deficits: Secondary | ICD-10-CM | POA: Diagnosis not present

## 2022-12-25 DIAGNOSIS — R519 Headache, unspecified: Secondary | ICD-10-CM | POA: Diagnosis not present

## 2022-12-25 DIAGNOSIS — R4189 Other symptoms and signs involving cognitive functions and awareness: Secondary | ICD-10-CM | POA: Diagnosis not present

## 2022-12-25 DIAGNOSIS — G44209 Tension-type headache, unspecified, not intractable: Secondary | ICD-10-CM | POA: Diagnosis not present

## 2022-12-25 DIAGNOSIS — Q2112 Patent foramen ovale: Secondary | ICD-10-CM | POA: Diagnosis not present

## 2022-12-25 DIAGNOSIS — R29898 Other symptoms and signs involving the musculoskeletal system: Secondary | ICD-10-CM | POA: Diagnosis not present

## 2022-12-25 DIAGNOSIS — R42 Dizziness and giddiness: Secondary | ICD-10-CM | POA: Diagnosis not present

## 2022-12-26 ENCOUNTER — Other Ambulatory Visit: Payer: Self-pay | Admitting: Neurology

## 2022-12-26 DIAGNOSIS — R413 Other amnesia: Secondary | ICD-10-CM

## 2022-12-29 ENCOUNTER — Ambulatory Visit
Admission: RE | Admit: 2022-12-29 | Discharge: 2022-12-29 | Disposition: A | Discharge status: Home / Self Care | Payer: Medicare Other | Source: Ambulatory Visit | Attending: Neurology | Admitting: Neurology

## 2022-12-29 DIAGNOSIS — R413 Other amnesia: Secondary | ICD-10-CM | POA: Diagnosis not present

## 2022-12-29 DIAGNOSIS — I6782 Cerebral ischemia: Secondary | ICD-10-CM | POA: Diagnosis not present

## 2022-12-29 DIAGNOSIS — G319 Degenerative disease of nervous system, unspecified: Secondary | ICD-10-CM | POA: Diagnosis not present

## 2023-01-02 DIAGNOSIS — H401111 Primary open-angle glaucoma, right eye, mild stage: Secondary | ICD-10-CM | POA: Diagnosis not present

## 2023-01-02 DIAGNOSIS — H524 Presbyopia: Secondary | ICD-10-CM | POA: Diagnosis not present

## 2023-01-02 DIAGNOSIS — H43813 Vitreous degeneration, bilateral: Secondary | ICD-10-CM | POA: Diagnosis not present

## 2023-01-02 DIAGNOSIS — H401123 Primary open-angle glaucoma, left eye, severe stage: Secondary | ICD-10-CM | POA: Diagnosis not present

## 2023-01-03 NOTE — Progress Notes (Signed)
Carelink Summary Report / Loop Recorder 

## 2023-01-08 ENCOUNTER — Telehealth: Payer: Self-pay | Admitting: Internal Medicine

## 2023-01-08 ENCOUNTER — Telehealth: Payer: Self-pay | Admitting: Cardiology

## 2023-01-08 NOTE — Telephone Encounter (Signed)
Lvm to schedule lab review follow up appointment with [email or web address]-Toni

## 2023-01-08 NOTE — Telephone Encounter (Signed)
Patient states that periodically she feels like something is sticking her on her left side around her breast area. She is not sure if it is her device or not. She states this feeling happens at least once a day. Please call to discuss. Patient denies any other symptoms.

## 2023-01-09 ENCOUNTER — Encounter: Payer: Self-pay | Admitting: Nurse Practitioner

## 2023-01-09 ENCOUNTER — Ambulatory Visit (INDEPENDENT_AMBULATORY_CARE_PROVIDER_SITE_OTHER): Payer: Medicare Other | Admitting: Nurse Practitioner

## 2023-01-09 VITALS — BP 110/60 | HR 94 | Temp 98.1°F | Resp 16 | Ht 66.0 in | Wt 187.4 lb

## 2023-01-09 DIAGNOSIS — E782 Mixed hyperlipidemia: Secondary | ICD-10-CM | POA: Diagnosis not present

## 2023-01-09 DIAGNOSIS — E538 Deficiency of other specified B group vitamins: Secondary | ICD-10-CM | POA: Diagnosis not present

## 2023-01-09 DIAGNOSIS — M25511 Pain in right shoulder: Secondary | ICD-10-CM

## 2023-01-09 DIAGNOSIS — I10 Essential (primary) hypertension: Secondary | ICD-10-CM | POA: Diagnosis not present

## 2023-01-09 DIAGNOSIS — R7303 Prediabetes: Secondary | ICD-10-CM

## 2023-01-09 MED ORDER — IBUPROFEN 800 MG PO TABS: 800.0000 mg | ORAL_TABLET | Freq: Three times a day (TID) | ORAL | 0 refills | Status: AC | PRN

## 2023-01-09 NOTE — Telephone Encounter (Signed)
Normal healing process. LMTCB

## 2023-01-09 NOTE — Progress Notes (Signed)
[facility] [address] [place], [place] [address]  Internal MEDICINE  Office Visit Note  Patient Name: Virginia Phillips  [id number]  [id number]  Date of Service: 01/09/2023  Chief Complaint  Patient presents with   Follow-up   Gastroesophageal Reflux   Hypertension   Hyperlipidemia   Shoulder Pain    Right shoulder pain    HPI Diannah presents for a follow-up visit for right shoulder pain, prediabetes, high cholesterol Right shoulder pain -- declined orthopedic, has appt with her massage therapist coming up this week.  A1c is continuing to creep up to 6.4 now, discuss diet changes.  High triglycerides -- taking simvastatin and ezetimibe.  CBC, B12, folate, vitamin D, iron panel and metabolic panel are normal     Current Medication: Outpatient Encounter Medications as of 01/09/2023  Medication Sig   acetaminophen (TYLENOL) 650 MG CR tablet Take 650 mg by mouth every 8 (eight) hours as needed for pain.   aspirin 325 MG tablet Take 325 mg by mouth daily.   aspirin-acetaminophen-caffeine (EXCEDRIN MIGRAINE) 250-250-65 MG tablet Take 2 tablets by mouth daily as needed for headache or migraine.   aspirin-sod bicarb-citric acid (ALKA-SELTZER) 325 MG TBEF tablet Take 650 mg by mouth every 6 (six) hours as needed (indigestion).   Biotin 5 MG CAPS Take 5,000 mg by mouth daily at 2 PM.   Cholecalciferol (VITAMIN D) 2000 UNITS CAPS Take 2,000 Units by mouth daily at 2 PM.    Coenzyme Q10 (CO Q10) 200 MG CAPS Take 200 mg by mouth daily at 2 PM.    ezetimibe (ZETIA) 10 MG tablet TAKE 1 TABLET(10 MG) BY MOUTH DAILY   ibuprofen (ADVIL) 800 MG tablet Take 1 tablet (800 mg total) by mouth every 8 (eight) hours as needed (joint pains).   ibuprofen (ADVIL,MOTRIN) 200 MG tablet Take 400 mg by mouth daily as needed for headache or moderate pain.   Lancets (ONETOUCH ULTRASOFT) lancets Check sugar once daily DX 73.9 needs for one touch ultra 2 lancets   lubiprostone (AMITIZA) 8 MCG  capsule TAKE ONE CAPSULE BY MOUTH TWICE DAILY WITH A MEAL   Magnesium 400 MG TABS Take by mouth daily.   Misc Natural Products (PETADOLEX 50) 50 MG CAPS Take 100 mg by mouth daily.   multivitamin-lutein (OCUVITE-LUTEIN) CAPS capsule Take 1 capsule by mouth daily.   omeprazole (PRILOSEC OTC) 20 MG tablet Take 20 mg by mouth daily.   ONE TOUCH ULTRA TEST test strip CHECK BLOOD SUGAR ONCE DAILY AS DIRECTED   pilocarpine (SALAGEN) 5 MG tablet Take 5 mg by mouth 2 (two) times daily.   polyethylene glycol (MIRALAX / GLYCOLAX) packet Take 17 g by mouth daily.    Polyvinyl Alcohol-Povidone (REFRESH OP) Place 1 drop into both eyes daily as needed (dry eyes).   pyridOXINE (VITAMIN B-6) 100 MG tablet Take 100 mg by mouth daily at 2 PM.   Riboflavin (B2) 100 MG TABS Take 100 mg by mouth 2 (two) times daily.   Thiamine HCl (VITAMIN B-1) 250 MG tablet Take 250 mg by mouth daily at 2 PM.   valsartan (DIOVAN) 80 MG tablet TAKE 1 TABLET(80 MG) BY MOUTH EVERY DAY   venlafaxine XR (EFFEXOR-XR) 75 MG 24 hr capsule TAKE 1 CAPSULE BY MOUTH DAILY AT 2 PM   vitamin B-12 (CYANOCOBALAMIN) 1000 MCG tablet Take 1,000 mcg by mouth daily at 2 PM.   vitamin C (ASCORBIC ACID) 500 MG tablet Take 500 mg by mouth daily at 2 PM.  simvastatin (ZOCOR) 20 MG tablet Take 1 tablet (20 mg total) by mouth at bedtime.   Facility-Administered Encounter Medications as of 01/09/2023  Medication   mupirocin ointment (BACTROBAN) 2 %    Surgical History: Past Surgical History:  Procedure Laterality Date   ANKLE FRACTURE SURGERY Right 2007   COLONOSCOPY WITH PROPOFOL N/A 05/10/2017   Procedure: COLONOSCOPY WITH PROPOFOL;  Surgeon: Pasty Spillers, MD;  Location: [facility];  Service: Endoscopy;  Laterality: N/A;   ESOPHAGOGASTRODUODENOSCOPY (EGD) WITH PROPOFOL N/A 10/17/2017   Procedure: ESOPHAGOGASTRODUODENOSCOPY (EGD) WITH PROPOFOL;  Surgeon: Pasty Spillers, MD;  Location: [facility] ENDOSCOPY;  Service: Endoscopy;   Laterality: N/A;   GANGLION CYST EXCISION Right    wrist   HERNIA REPAIR     2011   KNEE ARTHROSCOPY W/ ACL RECONSTRUCTION Right 2008   KNEE SURGERY  08/05/2007   LASIK     NISSEN FUNDOPLICATION  2012    Medical History: Past Medical History:  Diagnosis Date   Coronary artery disease    GERD (gastroesophageal reflux disease)    Glaucoma    Headache    migraines   History of shingles    Hyperlipidemia    Hypertension    Pre-diabetes    Pulmonary embolism on right [facility]) 2007   right lung   Sjogren's syndrome (HCC)    Stroke (HCC) 1998    Family History: Family History  Problem Relation Age of Onset   Congestive Heart Failure Mother    Parkinson's disease Mother    Diabetes Father    Scleroderma Sister    Cancer Brother     Social History   Socioeconomic History   Marital status: Married    Spouse name: Not on file   Number of children: 1   Years of education: Not on file   Highest education level: Some college, no degree  Occupational History   Occupation: retired  Tobacco Use   Smoking status: Never   Smokeless tobacco: Never  Vaping Use   Vaping status: Never Used  Substance and Sexual Activity   Alcohol use: No    Alcohol/week: 0.0 standard drinks of alcohol   Drug use: No   Sexual activity: Not on file  Other Topics Concern   Not on file  Social History Narrative   Not on file   Social Determinants of Health   Financial Resource Strain: Low Risk  (06/14/2017)   Overall Financial Resource Strain (CARDIA)    Difficulty of Paying Living Expenses: Not hard at all  Food Insecurity: No Food Insecurity (12/05/2021)   Hunger Vital Sign    Worried About Running Out of Food in the Last Year: Never true    Ran Out of Food in the Last Year: Never true  Transportation Needs: No Transportation Needs (12/05/2021)   PRAPARE - Administrator, Civil Service (Medical): No    Lack of Transportation (Non-Medical): No  Physical Activity: Not on file   Stress: No Stress Concern Present (06/14/2017)   [employer] of Occupational Health - Occupational Stress Questionnaire    Feeling of Stress : Only a little  Social Connections: Not on file  Intimate Partner Violence: Not At Risk (12/05/2021)   Humiliation, Afraid, Rape, and Kick questionnaire    Fear of Current or Ex-Partner: No    Emotionally Abused: No    Physically Abused: No    Sexually Abused: No      Review of Systems  Constitutional:  Negative for chills, fatigue and unexpected  weight change.  HENT:  Negative for congestion, postnasal drip, rhinorrhea, sneezing and sore throat.   Eyes:  Negative for redness.  Respiratory:  Negative for cough, chest tightness and shortness of breath.   Cardiovascular:  Negative for chest pain, palpitations and leg swelling.  Gastrointestinal:  Negative for abdominal pain, constipation, diarrhea, nausea and vomiting.  Genitourinary:  Negative for dysuria and frequency.  Musculoskeletal:  Positive for arthralgias (right shoulder) and myalgias. Negative for back pain, joint swelling and neck pain.  Skin:  Negative for rash.  Neurological: Negative.  Negative for tremors and numbness.  Hematological:  Negative for adenopathy. Does not bruise/bleed easily.  Psychiatric/Behavioral:  Negative for behavioral problems (Depression), sleep disturbance and suicidal ideas. The patient is not nervous/anxious.     Vital Signs: BP 110/60   Pulse 94   Temp 98.1 F (36.7 C)   Resp 16   Ht 5\' 6"  (1.676 m)   Wt 187 lb 6.4 oz (85 kg)   SpO2 95%   BMI 30.25 kg/m    Physical Exam Vitals reviewed.  Constitutional:      General: She is not in acute distress.    Appearance: Normal appearance. She is not ill-appearing.  HENT:     Head: Normocephalic and atraumatic.  Eyes:     Pupils: Pupils are equal, round, and reactive to light.  Cardiovascular:     Rate and Rhythm: Normal rate and regular rhythm.  Pulmonary:     Effort: Pulmonary effort is  normal. No respiratory distress.  Neurological:     Mental Status: She is alert and oriented to person, place, and time.  Psychiatric:        Mood and Affect: Mood normal.        Behavior: Behavior normal.        Assessment/Plan: 1. Prediabetes Discussed low carbohydrate and low sugar diet and increasing physical activity. Repeat A1c in  months.   2. Mixed hyperlipidemia Prescription fish oil supplement prescribed, will nee PA. Continue simvastatin and ezetimibe as prescribed.  - omega-3 acid ethyl esters (LOVAZA) 1 g capsule; Take 1 capsule (1 g total) by mouth 2 (two) times daily.  Dispense: 60 capsule; Refill: 5  3. Essential hypertension Stable, continue medications as prescribed.   4. B12 deficiency (Primary) B12 level is normal, may take otc supplement if desired.   5. Acute pain of right shoulder Prn ibuprofen ordered, take as needed.  - ibuprofen (ADVIL) 800 MG tablet; Take 1 tablet (800 mg total) by mouth every 8 (eight) hours as needed (joint pains).  Dispense: 30 tablet; Refill: 0   General Counseling: Mirtie verbalizes understanding of the findings of todays visit and agrees with plan of treatment. I have discussed any further diagnostic evaluation that may be needed or ordered today. We also reviewed her medications today. she has been encouraged to call the office with any questions or concerns that should arise related to todays visit.    No orders of the defined types were placed in this encounter.   Meds ordered this encounter  Medications   ibuprofen (ADVIL) 800 MG tablet    Sig: Take 1 tablet (800 mg total) by mouth every 8 (eight) hours as needed (joint pains).    Dispense:  30 tablet    Refill:  0    Return in about 3 months (around 04/11/2023) for F/U, Valerye Kobus PCP lipid panel recheck .   Total time spent:30 Minutes Time spent includes review of chart, medications, test results, and follow  up plan with the patient.   [place] Controlled Substance  Database was reviewed by me.  This patient was seen by [name], FNP-C in collaboration with Dr. Beverely Risen as a part of collaborative care agreement.   Daysie Helf R. Tedd Sias, MSN, FNP-C Internal medicine

## 2023-01-10 NOTE — Telephone Encounter (Signed)
Attempted 2nd outreach attempt.  LMTCB.

## 2023-01-11 NOTE — Telephone Encounter (Signed)
Patient reports of a "sticking" sensation in the side of her lower breast and when she raises her breast she no longer feels it. States the sensation is not near the device. Patient advised this does not sound related to her device and to follow up with her pcp. Pt voiced understanding and agreeable to plan.

## 2023-01-15 ENCOUNTER — Encounter: Payer: Self-pay | Admitting: Nurse Practitioner

## 2023-01-16 NOTE — Progress Notes (Signed)
Left without being seen.

## 2023-01-22 ENCOUNTER — Ambulatory Visit: Payer: Medicare Other

## 2023-01-22 DIAGNOSIS — R413 Other amnesia: Secondary | ICD-10-CM | POA: Diagnosis not present

## 2023-01-22 DIAGNOSIS — Z8673 Personal history of transient ischemic attack (TIA), and cerebral infarction without residual deficits: Secondary | ICD-10-CM | POA: Diagnosis not present

## 2023-01-22 DIAGNOSIS — R4189 Other symptoms and signs involving cognitive functions and awareness: Secondary | ICD-10-CM | POA: Diagnosis not present

## 2023-01-22 DIAGNOSIS — R29898 Other symptoms and signs involving the musculoskeletal system: Secondary | ICD-10-CM | POA: Diagnosis not present

## 2023-01-22 DIAGNOSIS — R519 Headache, unspecified: Secondary | ICD-10-CM | POA: Diagnosis not present

## 2023-01-22 DIAGNOSIS — G44209 Tension-type headache, unspecified, not intractable: Secondary | ICD-10-CM | POA: Diagnosis not present

## 2023-01-22 DIAGNOSIS — R42 Dizziness and giddiness: Secondary | ICD-10-CM | POA: Diagnosis not present

## 2023-01-22 DIAGNOSIS — I639 Cerebral infarction, unspecified: Secondary | ICD-10-CM

## 2023-01-22 DIAGNOSIS — R002 Palpitations: Secondary | ICD-10-CM | POA: Diagnosis not present

## 2023-01-22 DIAGNOSIS — Q2112 Patent foramen ovale: Secondary | ICD-10-CM | POA: Diagnosis not present

## 2023-01-22 LAB — CUP PACEART REMOTE DEVICE CHECK
Date Time Interrogation Session: 20241122230710
Implantable Pulse Generator Implant Date: 20240605

## 2023-02-07 ENCOUNTER — Telehealth: Payer: Self-pay

## 2023-02-07 NOTE — Telephone Encounter (Signed)
Called in [facility] billing for pt bill and update ICD 10 code they will submit  and disregard bill they send new invoice its take 30 to 40 days and also lmom to pt that disregard bill

## 2023-02-19 NOTE — Progress Notes (Signed)
Carelink Summary Report / Loop Recorder 

## 2023-02-26 ENCOUNTER — Ambulatory Visit (INDEPENDENT_AMBULATORY_CARE_PROVIDER_SITE_OTHER): Payer: Medicare Other

## 2023-02-26 DIAGNOSIS — I639 Cerebral infarction, unspecified: Secondary | ICD-10-CM

## 2023-03-07 ENCOUNTER — Telehealth: Payer: Self-pay | Admitting: Nurse Practitioner

## 2023-03-12 MED ORDER — OMEGA-3-ACID ETHYL ESTERS 1 G PO CAPS: 1.0000 g | ORAL_CAPSULE | Freq: Two times a day (BID) | ORAL | 5 refills | Status: DC

## 2023-03-23 NOTE — Telephone Encounter (Signed)
error

## 2023-03-31 ENCOUNTER — Encounter: Payer: Self-pay | Admitting: Nurse Practitioner

## 2023-04-02 ENCOUNTER — Ambulatory Visit (INDEPENDENT_AMBULATORY_CARE_PROVIDER_SITE_OTHER): Payer: Medicare Other

## 2023-04-02 DIAGNOSIS — I639 Cerebral infarction, unspecified: Secondary | ICD-10-CM | POA: Diagnosis not present

## 2023-04-02 LAB — CUP PACEART REMOTE DEVICE CHECK
Date Time Interrogation Session: 20250202232137
Implantable Pulse Generator Implant Date: 20240605

## 2023-04-06 LAB — CUP PACEART REMOTE DEVICE CHECK
Date Time Interrogation Session: 20241229231804
Implantable Pulse Generator Implant Date: 20240605

## 2023-04-07 ENCOUNTER — Encounter: Payer: Self-pay | Admitting: Cardiology

## 2023-04-10 ENCOUNTER — Ambulatory Visit (INDEPENDENT_AMBULATORY_CARE_PROVIDER_SITE_OTHER): Payer: Medicare Other | Admitting: Nurse Practitioner

## 2023-04-10 ENCOUNTER — Encounter: Payer: Self-pay | Admitting: Nurse Practitioner

## 2023-04-10 VITALS — BP 133/82 | HR 78 | Temp 97.9°F | Resp 16 | Ht 66.0 in | Wt 186.6 lb

## 2023-04-10 DIAGNOSIS — R7303 Prediabetes: Secondary | ICD-10-CM

## 2023-04-10 DIAGNOSIS — I10 Essential (primary) hypertension: Secondary | ICD-10-CM

## 2023-04-10 DIAGNOSIS — Z8673 Personal history of transient ischemic attack (TIA), and cerebral infarction without residual deficits: Secondary | ICD-10-CM

## 2023-04-10 DIAGNOSIS — E782 Mixed hyperlipidemia: Secondary | ICD-10-CM

## 2023-04-10 DIAGNOSIS — H1033 Unspecified acute conjunctivitis, bilateral: Secondary | ICD-10-CM | POA: Diagnosis not present

## 2023-04-10 MED ORDER — OMEGA-3-ACID ETHYL ESTERS 1 G PO CAPS: 1.0000 g | ORAL_CAPSULE | Freq: Two times a day (BID) | ORAL | 5 refills | Status: AC

## 2023-04-10 MED ORDER — OFLOXACIN 0.3 % OP SOLN: 1.0000 [drp] | Freq: Four times a day (QID) | OPHTHALMIC | 0 refills | Status: AC

## 2023-04-10 NOTE — Progress Notes (Deleted)
 [facility] [address] [place], [place] [address]  Internal MEDICINE  Office Visit Note  Patient Name: Virginia Phillips  [id number]  [id number]  Date of Service: 04/10/2023  Chief Complaint  Patient presents with  . Hyperlipidemia  . Hypertension  . Gastroesophageal Reflux  . Follow-up    HPI Patryce presents for a follow-up visit for      Current Medication: Outpatient Encounter Medications as of 04/10/2023  Medication Sig  . acetaminophen (TYLENOL) 650 MG CR tablet Take 650 mg by mouth every 8 (eight) hours as needed for pain.  Marland Kitchen aspirin 325 MG tablet Take 325 mg by mouth daily.  Marland Kitchen aspirin-acetaminophen-caffeine (EXCEDRIN MIGRAINE) 250-250-65 MG tablet Take 2 tablets by mouth daily as needed for headache or migraine.  Marland Kitchen aspirin-sod bicarb-citric acid (ALKA-SELTZER) 325 MG TBEF tablet Take 650 mg by mouth every 6 (six) hours as needed (indigestion).  . Biotin 5 MG CAPS Take 5,000 mg by mouth daily at 2 PM.  . Cholecalciferol (VITAMIN D) 2000 UNITS CAPS Take 2,000 Units by mouth daily at 2 PM.   . Coenzyme Q10 (CO Q10) 200 MG CAPS Take 200 mg by mouth daily at 2 PM.   . ezetimibe (ZETIA) 10 MG tablet TAKE 1 TABLET(10 MG) BY MOUTH DAILY  . ibuprofen (ADVIL) 800 MG tablet Take 1 tablet (800 mg total) by mouth every 8 (eight) hours as needed (joint pains).  Marland Kitchen ibuprofen (ADVIL,MOTRIN) 200 MG tablet Take 400 mg by mouth daily as needed for headache or moderate pain.  . Lancets (ONETOUCH ULTRASOFT) lancets Check sugar once daily DX 73.9 needs for one touch ultra 2 lancets  . lubiprostone (AMITIZA) 8 MCG capsule TAKE ONE CAPSULE BY MOUTH TWICE DAILY WITH A MEAL  . Magnesium 400 MG TABS Take by mouth daily.  . Misc Natural Products (PETADOLEX 50) 50 MG CAPS Take 100 mg by mouth daily.  . multivitamin-lutein (OCUVITE-LUTEIN) CAPS capsule Take 1 capsule by mouth daily.  Marland Kitchen omega-3 acid ethyl esters (LOVAZA) 1 g capsule Take 1 capsule (1 g total) by mouth 2 (two) times daily.   Marland Kitchen omeprazole (PRILOSEC OTC) 20 MG tablet Take 20 mg by mouth daily.  . ONE TOUCH ULTRA TEST test strip CHECK BLOOD SUGAR ONCE DAILY AS DIRECTED  . pilocarpine (SALAGEN) 5 MG tablet Take 5 mg by mouth 2 (two) times daily.  . polyethylene glycol (MIRALAX / GLYCOLAX) packet Take 17 g by mouth daily.   . Polyvinyl Alcohol-Povidone (REFRESH OP) Place 1 drop into both eyes daily as needed (dry eyes).  . pyridOXINE (VITAMIN B-6) 100 MG tablet Take 100 mg by mouth daily at 2 PM.  . Riboflavin (B2) 100 MG TABS Take 100 mg by mouth 2 (two) times daily.  . Thiamine HCl (VITAMIN B-1) 250 MG tablet Take 250 mg by mouth daily at 2 PM.  . valsartan (DIOVAN) 80 MG tablet TAKE 1 TABLET(80 MG) BY MOUTH EVERY DAY  . venlafaxine XR (EFFEXOR-XR) 75 MG 24 hr capsule TAKE 1 CAPSULE BY MOUTH DAILY AT 2 PM  . vitamin B-12 (CYANOCOBALAMIN) 1000 MCG tablet Take 1,000 mcg by mouth daily at 2 PM.  . vitamin C (ASCORBIC ACID) 500 MG tablet Take 500 mg by mouth daily at 2 PM.  . simvastatin (ZOCOR) 20 MG tablet Take 1 tablet (20 mg total) by mouth at bedtime.   Facility-Administered Encounter Medications as of 04/10/2023  Medication  . mupirocin ointment (BACTROBAN) 2 %    Surgical History: Past Surgical History:  Procedure Laterality Date  . ANKLE FRACTURE SURGERY Right 2007  . COLONOSCOPY WITH PROPOFOL N/A 05/10/2017   Procedure: COLONOSCOPY WITH PROPOFOL;  Surgeon: Pasty Spillers, MD;  Location: [facility] END[facility]COPY;  Service: Endoscopy;  Laterality: N/A;  . ESOPHAGOGASTRODUODENOSCOPY (EGD) WITH PROPOFOL N/A 10/17/2017   Procedure: ESOPHAGOGASTRODUODENOSCOPY (EGD) WITH PROPOFOL;  Surgeon: Pasty Spillers, MD;  Location: [facility] END[facility];  Service: Endoscopy;  Laterality: N/A;  . GANGLION CYST EXCISION Right    wrist  . HERNIA REPAIR     2011  . KNEE ARTHROSCOPY W/ ACL RECONSTRUCTION Right 2008  . KNEE SURGERY  08/05/2007  . LASIK    . NISSEN FUNDOPLICATION  2012    Medical History: Past Medical History:   Diagnosis Date  . Coronary artery disease   . GERD (gastroesophageal reflux disease)   . Glaucoma   . Headache    migraines  . History of shingles   . Hyperlipidemia   . Hypertension   . Pre-diabetes   . Pulmonary embolism on right [facility]) 2007   right lung  . Sjogren's syndrome (HCC)   . Stroke [facility]) 1998    Family History: Family History  Problem Relation Age of Onset  . Congestive Heart Failure Mother   . Parkinson's disease Mother   . Diabetes Father   . Scleroderma Sister   . Cancer Brother     Social History   Socioeconomic History  . Marital status: Married    Spouse name: Not on file  . Number of children: 1  . Years of education: Not on file  . Highest education level: Some college, no degree  Occupational History  . Occupation: retired  Tobacco Use  . Smoking status: Never  . Smokeless tobacco: Never  Vaping Use  . Vaping status: Never Used  Substance and Sexual Activity  . Alcohol use: No    Alcohol/week: 0.0 standard drinks of alcohol  . Drug use: No  . Sexual activity: Not on file  Other Topics Concern  . Not on file  Social History Narrative  . Not on file   Social Drivers of Health   Financial Resource Strain: Low Risk  (06/14/2017)   Overall Financial Resource Strain (CARDIA)   . Difficulty of Paying Living Expenses: Not hard at all  Food Insecurity: No Food Insecurity (12/05/2021)   Hunger Vital Sign   . Worried About Programme researcher, broadcasting/film/video in the Last Year: Never true   . Ran Out of Food in the Last Year: Never true  Transportation Needs: No Transportation Needs (12/05/2021)   PRAPARE - Transportation   . Lack of Transportation (Medical): No   . Lack of Transportation (Non-Medical): No  Physical Activity: Not on file  Stress: No Stress Concern Present (06/14/2017)   [employer] of Occupational Health - Occupational Stress Questionnaire   . Feeling of Stress : Only a little  Social Connections: Not on file  Intimate Partner  Violence: Not At Risk (12/05/2021)   Humiliation, Afraid, Rape, and Kick questionnaire   . Fear of Current or Ex-Partner: No   . Emotionally Abused: No   . Physically Abused: No   . Sexually Abused: No      Review of Systems  Vital Signs: BP 133/82   Pulse 78   Temp 97.9 F (36.6 C)   Resp 16   Ht 5\' 6"  (1.676 m)   Wt 186 lb 9.6 oz (84.6 kg)   SpO2 93%   BMI 30.12 kg/m    Physical Exam  Assessment/Plan:   General Counseling: Nateisha verbalizes understanding of the findings of todays visit and agrees with plan of treatment. I have discussed any further diagnostic evaluation that may be needed or ordered today. We also reviewed her medications today. she has been encouraged to call the office with any questions or concerns that should arise related to todays visit.    No orders of the defined types were placed in this encounter.   No orders of the defined types were placed in this encounter.   No follow-ups on file.   Total time spent:*** Minutes Time spent includes review of chart, medications, test results, and follow up plan with the patient.   [place] Controlled Substance Database was reviewed by me.  This patient was seen by [name], FNP-C in collaboration with Dr. Beverely Risen as a part of collaborative care agreement.   Curvin Hunger R. Tedd Sias, MSN, FNP-C Internal medicine

## 2023-04-10 NOTE — Progress Notes (Signed)
 [facility] [address] [place], [place] [address]  Internal MEDICINE  Office Visit Note  Patient Name: Virginia Phillips  [id number]  [id number]  Date of Service: 04/10/2023  Chief Complaint  Patient presents with   Hyperlipidemia   Hypertension   Gastroesophageal Reflux   Follow-up    HPI [place] presents for a follow-up visit for eye infection, high cholesterol, prediabetes, and hypertension Eye infection -- reports green drainage, crusting, itching, eye pain, and feeling like there is sand in her eyes.  Hyperlipidemia -- tried to prescribed lovaza but did not get approved. Last lipid panel was September 2024. Has history of stroke as well.  Prediabetes -- having some episodes of low blood glucose sometimes recently.  Hypertension -- controlled with current medication. History of stroke last year, this was her second one.     Current Medication: Outpatient Encounter Medications as of 04/10/2023  Medication Sig   acetaminophen (TYLENOL) 650 MG CR tablet Take 650 mg by mouth every 8 (eight) hours as needed for pain.   aspirin 325 MG tablet Take 325 mg by mouth daily.   aspirin-acetaminophen-caffeine (EXCEDRIN MIGRAINE) 250-250-65 MG tablet Take 2 tablets by mouth daily as needed for headache or migraine.   aspirin-sod bicarb-citric acid (ALKA-SELTZER) 325 MG TBEF tablet Take 650 mg by mouth every 6 (six) hours as needed (indigestion).   Biotin 5 MG CAPS Take 5,000 mg by mouth daily at 2 PM.   Cholecalciferol (VITAMIN D) 2000 UNITS CAPS Take 2,000 Units by mouth daily at 2 PM.    Coenzyme Q10 (CO Q10) 200 MG CAPS Take 200 mg by mouth daily at 2 PM.    ezetimibe (ZETIA) 10 MG tablet TAKE 1 TABLET(10 MG) BY MOUTH DAILY   ibuprofen (ADVIL) 800 MG tablet Take 1 tablet (800 mg total) by mouth every 8 (eight) hours as needed (joint pains).   ibuprofen (ADVIL,MOTRIN) 200 MG tablet Take 400 mg by mouth daily as needed for headache or moderate pain.   Lancets (ONETOUCH  ULTRASOFT) lancets Check sugar once daily DX 73.9 needs for one touch ultra 2 lancets   lubiprostone (AMITIZA) 8 MCG capsule TAKE ONE CAPSULE BY MOUTH TWICE DAILY WITH A MEAL   Magnesium 400 MG TABS Take by mouth daily.   Misc Natural Products (PETADOLEX 50) 50 MG CAPS Take 100 mg by mouth daily.   multivitamin-lutein (OCUVITE-LUTEIN) CAPS capsule Take 1 capsule by mouth daily.   [EXPIRED] ofloxacin (OCUFLOX) 0.3 % ophthalmic solution Place 1 drop into both eyes 4 (four) times daily for 5 days.   omeprazole (PRILOSEC OTC) 20 MG tablet Take 20 mg by mouth daily.   ONE TOUCH ULTRA TEST test strip CHECK BLOOD SUGAR ONCE DAILY AS DIRECTED   pilocarpine (SALAGEN) 5 MG tablet Take 5 mg by mouth 2 (two) times daily.   polyethylene glycol (MIRALAX / GLYCOLAX) packet Take 17 g by mouth daily.    Polyvinyl Alcohol-Povidone (REFRESH OP) Place 1 drop into both eyes daily as needed (dry eyes).   pyridOXINE (VITAMIN B-6) 100 MG tablet Take 100 mg by mouth daily at 2 PM.   Riboflavin (B2) 100 MG TABS Take 100 mg by mouth 2 (two) times daily.   Thiamine HCl (VITAMIN B-1) 250 MG tablet Take 250 mg by mouth daily at 2 PM.   valsartan (DIOVAN) 80 MG tablet TAKE 1 TABLET(80 MG) BY MOUTH EVERY DAY   venlafaxine XR (EFFEXOR-XR) 75 MG 24 hr capsule TAKE 1 CAPSULE BY MOUTH DAILY AT 2 PM  vitamin B-12 (CYANOCOBALAMIN) 1000 MCG tablet Take 1,000 mcg by mouth daily at 2 PM.   vitamin C (ASCORBIC ACID) 500 MG tablet Take 500 mg by mouth daily at 2 PM.   [DISCONTINUED] omega-3 acid ethyl esters (LOVAZA) 1 g capsule Take 1 capsule (1 g total) by mouth 2 (two) times daily.   omega-3 acid ethyl esters (LOVAZA) 1 g capsule Take 1 capsule (1 g total) by mouth 2 (two) times daily.   simvastatin (ZOCOR) 20 MG tablet Take 1 tablet (20 mg total) by mouth at bedtime.   Facility-Administered Encounter Medications as of 04/10/2023  Medication   mupirocin ointment (BACTROBAN) 2 %    Surgical History: Past Surgical History:   Procedure Laterality Date   ANKLE FRACTURE SURGERY Right 2007   COLONOSCOPY WITH PROPOFOL N/A 05/10/2017   Procedure: COLONOSCOPY WITH PROPOFOL;  Surgeon: Pasty Spillers, MD;  Location: [facility] ENDOSCOPY;  Service: Endoscopy;  Laterality: N/A;   ESOPHAGOGASTRODUODENOSCOPY (EGD) WITH PROPOFOL N/A 10/17/2017   Procedure: ESOPHAGOGASTRODUODENOSCOPY (EGD) WITH PROPOFOL;  Surgeon: Pasty Spillers, MD;  Location: [facility] ENDOSCOPY;  Service: Endoscopy;  Laterality: N/A;   GANGLION CYST EXCISION Right    wrist   HERNIA REPAIR     2011   KNEE ARTHROSCOPY W/ ACL RECONSTRUCTION Right 2008   KNEE SURGERY  08/05/2007   LASIK     NISSEN FUNDOPLICATION  2012    Medical History: Past Medical History:  Diagnosis Date   Coronary artery disease    GERD (gastroesophageal reflux disease)    Glaucoma    Headache    migraines   History of shingles    Hyperlipidemia    Hypertension    Pre-diabetes    Pulmonary embolism on right [facility]) 2007   right lung   Sjogren's syndrome (HCC)    Stroke (HCC) 1998    Family History: Family History  Problem Relation Age of Onset   Congestive Heart Failure Mother    Parkinson's disease Mother    Diabetes Father    Scleroderma Sister    Cancer Brother     Social History   Socioeconomic History   Marital status: Married    Spouse name: Not on file   Number of children: 1   Years of education: Not on file   Highest education level: Some college, no degree  Occupational History   Occupation: retired  Tobacco Use   Smoking status: Never   Smokeless tobacco: Never  Vaping Use   Vaping status: Never Used  Substance and Sexual Activity   Alcohol use: No    Alcohol/week: 0.0 standard drinks of alcohol   Drug use: No   Sexual activity: Not on file  Other Topics Concern   Not on file  Social History Narrative   Not on file   Social Drivers of Health   Financial Resource Strain: Low Risk  (06/14/2017)   Overall Financial Resource Strain (CARDIA)     Difficulty of Paying Living Expenses: Not hard at all  Food Insecurity: No Food Insecurity (12/05/2021)   Hunger Vital Sign    Worried About Running Out of Food in the Last Year: Never true    Ran Out of Food in the Last Year: Never true  Transportation Needs: No Transportation Needs (12/05/2021)   PRAPARE - Administrator, Civil Service (Medical): No    Lack of Transportation (Non-Medical): No  Physical Activity: Not on file  Stress: No Stress Concern Present (06/14/2017)   [employer] of Occupational Health -  Occupational Stress Questionnaire    Feeling of Stress : Only a little  Social Connections: Not on file  Intimate Partner Violence: Not At Risk (12/05/2021)   Humiliation, Afraid, Rape, and Kick questionnaire    Fear of Current or Ex-Partner: No    Emotionally Abused: No    Physically Abused: No    Sexually Abused: No      Review of Systems  Constitutional:  Negative for chills, fatigue and unexpected weight change.  HENT:  Negative for congestion, postnasal drip, rhinorrhea, sneezing and sore throat.   Eyes:  Positive for discharge, redness and itching.  Respiratory: Negative.  Negative for cough, chest tightness, shortness of breath and wheezing.   Cardiovascular: Negative.  Negative for chest pain, palpitations and leg swelling.  Gastrointestinal:  Negative for abdominal pain, constipation, diarrhea, nausea and vomiting.  Genitourinary:  Negative for dysuria and frequency.  Musculoskeletal:  Positive for arthralgias and myalgias. Negative for back pain, joint swelling and neck pain.  Skin:  Negative for rash.  Neurological: Negative.  Negative for tremors and numbness.  Hematological:  Negative for adenopathy. Does not bruise/bleed easily.  Psychiatric/Behavioral:  Negative for behavioral problems (Depression), sleep disturbance and suicidal ideas. The patient is not nervous/anxious.     Vital Signs: BP 133/82   Pulse 78   Temp 97.9 F (36.6 C)    Resp 16   Ht 5\' 6"  (1.676 m)   Wt 186 lb 9.6 oz (84.6 kg)   SpO2 93%   BMI 30.12 kg/m    Physical Exam Vitals reviewed.  Constitutional:      General: She is not in acute distress.    Appearance: Normal appearance. She is not ill-appearing.  HENT:     Head: Normocephalic and atraumatic.  Eyes:     Conjunctiva/sclera:     Right eye: Right conjunctiva is injected.     Left eye: Left conjunctiva is injected.     Pupils: Pupils are equal, round, and reactive to light.  Cardiovascular:     Rate and Rhythm: Normal rate and regular rhythm.  Pulmonary:     Effort: Pulmonary effort is normal. No respiratory distress.  Neurological:     Mental Status: She is alert and oriented to person, place, and time.  Psychiatric:        Mood and Affect: Mood normal.        Behavior: Behavior normal.        Assessment/Plan: 1. Acute bacterial conjunctivitis of both eyes Antibiotic eye drops prescribed.  - ofloxacin (OCUFLOX) 0.3 % ophthalmic solution; Place 1 drop into both eyes 4 (four) times daily for 5 days.  Dispense: 5 mL; Refill: 0  2. Prediabetes (Primary) Routine lipid panel ordered  - Lipid Profile  3. Mixed hyperlipidemia Repeat lipid panel  - Lipid Profile  4. Essential hypertension Stable, continue valsartan as prescribed.   5. History of stroke Repeat lipid panel ordered  - Lipid Profile   General Counseling: Donesha verbalizes understanding of the findings of todays visit and agrees with plan of treatment. I have discussed any further diagnostic evaluation that may be needed or ordered today. We also reviewed her medications today. she has been encouraged to call the office with any questions or concerns that should arise related to todays visit.    Orders Placed This Encounter  Procedures   Lipid Profile    Meds ordered this encounter  Medications   ofloxacin (OCUFLOX) 0.3 % ophthalmic solution    Sig: Place 1 drop into  both eyes 4 (four) times daily for 5  days.    Dispense:  5 mL    Refill:  0    No allergy issue, already reviewed.   omega-3 acid ethyl esters (LOVAZA) 1 g capsule    Sig: Take 1 capsule (1 g total) by mouth 2 (two) times daily.    Dispense:  60 capsule    Refill:  5    Fill new script today, please send prior authorization request asap so we can get this approved, thanks.    Return in about 4 months (around 08/08/2023) for F/U, Sumiko Ceasar PCP.   Total time spent:30 Minutes Time spent includes review of chart, medications, test results, and follow up plan with the patient.    Controlled Substance Database was reviewed by me.  This patient was seen by [name], FNP-C in collaboration with Dr. Beverely Risen as a part of collaborative care agreement.   Cadell Gabrielson R. Tedd Sias, MSN, FNP-C Internal medicine

## 2023-04-12 ENCOUNTER — Telehealth: Payer: Self-pay

## 2023-04-12 NOTE — Telephone Encounter (Signed)
Left message for patient to let her know that Omega 3 acid is not cover by her insurance and that she can buy it over the counter

## 2023-04-17 DIAGNOSIS — H43813 Vitreous degeneration, bilateral: Secondary | ICD-10-CM | POA: Diagnosis not present

## 2023-04-17 DIAGNOSIS — Z9842 Cataract extraction status, left eye: Secondary | ICD-10-CM | POA: Diagnosis not present

## 2023-04-17 DIAGNOSIS — H401111 Primary open-angle glaucoma, right eye, mild stage: Secondary | ICD-10-CM | POA: Diagnosis not present

## 2023-04-17 DIAGNOSIS — Z9841 Cataract extraction status, right eye: Secondary | ICD-10-CM | POA: Diagnosis not present

## 2023-04-17 DIAGNOSIS — H401123 Primary open-angle glaucoma, left eye, severe stage: Secondary | ICD-10-CM | POA: Diagnosis not present

## 2023-05-07 ENCOUNTER — Ambulatory Visit (INDEPENDENT_AMBULATORY_CARE_PROVIDER_SITE_OTHER): Payer: Medicare Other

## 2023-05-07 DIAGNOSIS — I639 Cerebral infarction, unspecified: Secondary | ICD-10-CM | POA: Diagnosis not present

## 2023-05-08 LAB — CUP PACEART REMOTE DEVICE CHECK
Date Time Interrogation Session: 20250309232116
Implantable Pulse Generator Implant Date: 20240605

## 2023-05-09 NOTE — Progress Notes (Signed)
 [facility] Summary Report / Loop Recorder

## 2023-05-10 ENCOUNTER — Encounter: Payer: Self-pay | Admitting: Cardiology

## 2023-05-13 ENCOUNTER — Encounter: Payer: Self-pay | Admitting: Nurse Practitioner

## 2023-06-11 ENCOUNTER — Ambulatory Visit (INDEPENDENT_AMBULATORY_CARE_PROVIDER_SITE_OTHER): Payer: Medicare Other

## 2023-06-11 DIAGNOSIS — I639 Cerebral infarction, unspecified: Secondary | ICD-10-CM

## 2023-06-11 NOTE — Addendum Note (Signed)
 Addended by: Edra Govern D on: 06/11/2023 12:07 PM   Modules accepted: Orders

## 2023-06-11 NOTE — Progress Notes (Signed)
 [facility] Summary Report / Loop Recorder

## 2023-06-12 ENCOUNTER — Encounter: Payer: Self-pay | Admitting: Cardiology

## 2023-06-12 LAB — CUP PACEART REMOTE DEVICE CHECK
Date Time Interrogation Session: 20250413231945
Implantable Pulse Generator Implant Date: 20240605

## 2023-06-13 DIAGNOSIS — R5383 Other fatigue: Secondary | ICD-10-CM | POA: Diagnosis not present

## 2023-06-13 DIAGNOSIS — R519 Headache, unspecified: Secondary | ICD-10-CM | POA: Diagnosis not present

## 2023-06-13 DIAGNOSIS — R413 Other amnesia: Secondary | ICD-10-CM | POA: Diagnosis not present

## 2023-06-13 DIAGNOSIS — R200 Anesthesia of skin: Secondary | ICD-10-CM | POA: Diagnosis not present

## 2023-06-13 DIAGNOSIS — R202 Paresthesia of skin: Secondary | ICD-10-CM | POA: Diagnosis not present

## 2023-06-13 DIAGNOSIS — N951 Menopausal and female climacteric states: Secondary | ICD-10-CM | POA: Diagnosis not present

## 2023-06-13 DIAGNOSIS — R4189 Other symptoms and signs involving cognitive functions and awareness: Secondary | ICD-10-CM | POA: Diagnosis not present

## 2023-06-13 DIAGNOSIS — R2689 Other abnormalities of gait and mobility: Secondary | ICD-10-CM | POA: Diagnosis not present

## 2023-06-13 DIAGNOSIS — Z8673 Personal history of transient ischemic attack (TIA), and cerebral infarction without residual deficits: Secondary | ICD-10-CM | POA: Diagnosis not present

## 2023-06-13 DIAGNOSIS — R42 Dizziness and giddiness: Secondary | ICD-10-CM | POA: Diagnosis not present

## 2023-06-13 DIAGNOSIS — R0683 Snoring: Secondary | ICD-10-CM | POA: Diagnosis not present

## 2023-06-13 DIAGNOSIS — R531 Weakness: Secondary | ICD-10-CM | POA: Diagnosis not present

## 2023-06-26 ENCOUNTER — Encounter: Payer: Self-pay | Admitting: Nurse Practitioner

## 2023-06-26 ENCOUNTER — Ambulatory Visit (INDEPENDENT_AMBULATORY_CARE_PROVIDER_SITE_OTHER): Admitting: Nurse Practitioner

## 2023-06-26 VITALS — BP 125/65 | HR 90 | Temp 97.3°F | Resp 16 | Ht 66.0 in

## 2023-06-26 DIAGNOSIS — Z8673 Personal history of transient ischemic attack (TIA), and cerebral infarction without residual deficits: Secondary | ICD-10-CM | POA: Diagnosis not present

## 2023-06-26 DIAGNOSIS — B078 Other viral warts: Secondary | ICD-10-CM

## 2023-06-26 DIAGNOSIS — E782 Mixed hyperlipidemia: Secondary | ICD-10-CM | POA: Diagnosis not present

## 2023-06-26 DIAGNOSIS — I10 Essential (primary) hypertension: Secondary | ICD-10-CM

## 2023-06-26 NOTE — Progress Notes (Signed)
 [facility] [address] [place], [place] [address]  Internal MEDICINE  Office Visit Note  Patient Name: Virginia Phillips  [id number]  [id number]  Date of Service: 06/26/2023  Chief Complaint  Patient presents with   Gastroesophageal Reflux   Hypertension   Hyperlipidemia   Follow-up    Requesting dermatology referral     HPI Virginia Phillips presents for a follow-up visit for warts on her back Warts on her back -- wants dermatology referral. Warts on low back and 1 on her left buttocks, and 1 spot on her face she wants to have looked at.  Went to dentist yesterday, was told she needs a few teeth pulled.  Hypertension -- controlled with valsartan  High cholesterol -- taking simvastatin  and ezetimibe .    Current Medication: Outpatient Encounter Medications as of 06/26/2023  Medication Sig   acetaminophen  (TYLENOL ) 650 MG CR tablet Take 650 mg by mouth every 8 (eight) hours as needed for pain.   aspirin  325 MG tablet Take 325 mg by mouth daily.   aspirin -acetaminophen -caffeine (EXCEDRIN MIGRAINE) 250-250-65 MG tablet Take 2 tablets by mouth daily as needed for headache or migraine.   aspirin -sod bicarb-citric acid (ALKA-SELTZER) 325 MG TBEF tablet Take 650 mg by mouth every 6 (six) hours as needed (indigestion).   Biotin 5 MG CAPS Take 5,000 mg by mouth daily at 2 PM.   Cholecalciferol (VITAMIN D ) 2000 UNITS CAPS Take 2,000 Units by mouth daily at 2 PM.    Coenzyme Q10 (CO Q10) 200 MG CAPS Take 200 mg by mouth daily at 2 PM.    ezetimibe  (ZETIA ) 10 MG tablet TAKE 1 TABLET(10 MG) BY MOUTH DAILY   ibuprofen  (ADVIL ) 800 MG tablet Take 1 tablet (800 mg total) by mouth every 8 (eight) hours as needed (joint pains).   ibuprofen  (ADVIL ,MOTRIN ) 200 MG tablet Take 400 mg by mouth daily as needed for headache or moderate pain.   Lancets (ONETOUCH ULTRASOFT) lancets Check sugar once daily DX 73.9 needs for one touch ultra 2 lancets   lubiprostone  (AMITIZA ) 8 MCG capsule TAKE ONE CAPSULE BY  MOUTH TWICE DAILY WITH A MEAL   Magnesium 400 MG TABS Take by mouth daily.   Misc Natural Products (PETADOLEX 50) 50 MG CAPS Take 100 mg by mouth daily.   multivitamin-lutein (OCUVITE-LUTEIN) CAPS capsule Take 1 capsule by mouth daily.   omega-3 acid ethyl esters (LOVAZA ) 1 g capsule Take 1 capsule (1 g total) by mouth 2 (two) times daily.   omeprazole (PRILOSEC OTC) 20 MG tablet Take 20 mg by mouth daily.   ONE TOUCH ULTRA TEST test strip CHECK BLOOD SUGAR ONCE DAILY AS DIRECTED   pilocarpine  (SALAGEN ) 5 MG tablet Take 5 mg by mouth 2 (two) times daily.   polyethylene glycol (MIRALAX  / GLYCOLAX ) packet Take 17 g by mouth daily.    Polyvinyl Alcohol-Povidone (REFRESH OP) Place 1 drop into both eyes daily as needed (dry eyes).   pyridOXINE (VITAMIN B-6) 100 MG tablet Take 100 mg by mouth daily at 2 PM.   Riboflavin (B2) 100 MG TABS Take 100 mg by mouth 2 (two) times daily.   Thiamine HCl (VITAMIN B-1) 250 MG tablet Take 250 mg by mouth daily at 2 PM.   valsartan  (DIOVAN ) 80 MG tablet TAKE 1 TABLET(80 MG) BY MOUTH EVERY DAY   venlafaxine  XR (EFFEXOR -XR) 75 MG 24 hr capsule TAKE 1 CAPSULE BY MOUTH DAILY AT 2 PM   vitamin B-12 (CYANOCOBALAMIN ) 1000 MCG tablet Take 1,000 mcg by mouth  daily at 2 PM.   vitamin C (ASCORBIC ACID) 500 MG tablet Take 500 mg by mouth daily at 2 PM.   simvastatin  (ZOCOR ) 20 MG tablet Take 1 tablet (20 mg total) by mouth at bedtime.   Facility-Administered Encounter Medications as of 06/26/2023  Medication   mupirocin  ointment (BACTROBAN ) 2 %    Surgical History: Past Surgical History:  Procedure Laterality Date   ANKLE FRACTURE SURGERY Right 2007   COLONOSCOPY WITH PROPOFOL  N/A 05/10/2017   Procedure: COLONOSCOPY WITH PROPOFOL ;  Surgeon: Irby Mannan, MD;  Location: [facility] ENDOSCOPY;  Service: Endoscopy;  Laterality: N/A;   ESOPHAGOGASTRODUODENOSCOPY (EGD) WITH PROPOFOL  N/A 10/17/2017   Procedure: ESOPHAGOGASTRODUODENOSCOPY (EGD) WITH PROPOFOL ;  Surgeon:  Irby Mannan, MD;  Location: [facility] ENDOSCOPY;  Service: Endoscopy;  Laterality: N/A;   GANGLION CYST EXCISION Right    wrist   HERNIA REPAIR     2011   KNEE ARTHROSCOPY W/ ACL RECONSTRUCTION Right 2008   KNEE SURGERY  08/05/2007   LASIK     NISSEN FUNDOPLICATION  2012    Medical History: Past Medical History:  Diagnosis Date   Coronary artery disease    GERD (gastroesophageal reflux disease)    Glaucoma    Headache    migraines   History of shingles    Hyperlipidemia    Hypertension    Pre-diabetes    Pulmonary embolism on right [facility]) 2007   right lung   Sjogren's syndrome (HCC)    Stroke (HCC) 1998    Family History: Family History  Problem Relation Age of Onset   Congestive Heart Failure Mother    Parkinson's disease Mother    Diabetes Father    Scleroderma Sister    Cancer Brother     Social History   Socioeconomic History   Marital status: Married    Spouse name: Not on file   Number of children: 1   Years of education: Not on file   Highest education level: Some college, no degree  Occupational History   Occupation: retired  Tobacco Use   Smoking status: Never   Smokeless tobacco: Never  Vaping Use   Vaping status: Never Used  Substance and Sexual Activity   Alcohol use: No    Alcohol/week: 0.0 standard drinks of alcohol   Drug use: No   Sexual activity: Not on file  Other Topics Concern   Not on file  Social History Narrative   Not on file   Social Drivers of Health   Financial Resource Strain: Low Risk  (06/14/2017)   Overall Financial Resource Strain (CARDIA)    Difficulty of Paying Living Expenses: Not hard at all  Food Insecurity: No Food Insecurity (12/05/2021)   Hunger Vital Sign    Worried About Running Out of Food in the Last Year: Never true    Ran Out of Food in the Last Year: Never true  Transportation Needs: No Transportation Needs (12/05/2021)   PRAPARE - Administrator, Civil Service (Medical): No    Lack of  Transportation (Non-Medical): No  Physical Activity: Not on file  Stress: No Stress Concern Present (06/14/2017)   [employer] of Occupational Health - Occupational Stress Questionnaire    Feeling of Stress : Only a little  Social Connections: Not on file  Intimate Partner Violence: Not At Risk (12/05/2021)   Humiliation, Afraid, Rape, and Kick questionnaire    Fear of Current or Ex-Partner: No    Emotionally Abused: No    Physically Abused:  No    Sexually Abused: No      Review of Systems  Constitutional:  Positive for fatigue.  HENT: Negative.    Respiratory: Negative.  Negative for cough, chest tightness, shortness of breath and wheezing.   Cardiovascular: Negative.  Negative for chest pain and palpitations.  Musculoskeletal:  Positive for arthralgias and back pain.  Skin:        Warts on back, left buttocks and spot on her left cheek of her face.     Vital Signs: BP 125/65   Pulse 90   Temp (!) 97.3 F (36.3 C)   Resp 16   Ht 5\' 6"  (1.676 m)   SpO2 97%   BMI 30.12 kg/m    Physical Exam Vitals reviewed.  Constitutional:      General: She is not in acute distress.    Appearance: Normal appearance. She is not ill-appearing.  HENT:     Head: Normocephalic and atraumatic.  Eyes:     Pupils: Pupils are equal, round, and reactive to light.  Cardiovascular:     Rate and Rhythm: Normal rate and regular rhythm.  Skin:    General: Skin is warm and dry.     Capillary Refill: Capillary refill takes less than 2 seconds.     Findings: Lesion (3 warts on her lower back, 1 wart on her left buttocks and a small spot on her left cheek of her face.) present.  Neurological:     Mental Status: She is alert and oriented to person, place, and time.  Psychiatric:        Mood and Affect: Mood normal.        Behavior: Behavior normal.        Assessment/Plan: 1. Other viral warts (Primary) Referred to dermatology with Dr. Cathryn Cobb per patient request.  -  Ambulatory referral to Dermatology  2. Essential hypertension Stable, continue valsartan  as prescribed.   3. Mixed hyperlipidemia Continue simvastatin  and ezetimibe  as prescribed.   4. History of stroke Continue medications as prescribed.    General Counseling: Khai verbalizes understanding of the findings of todays visit and agrees with plan of treatment. I have discussed any further diagnostic evaluation that may be needed or ordered today. We also reviewed her medications today. she has been encouraged to call the office with any questions or concerns that should arise related to todays visit.    Orders Placed This Encounter  Procedures   Ambulatory referral to Dermatology    No orders of the defined types were placed in this encounter.   Return if symptoms worsen or fail to improve.   Total time spent:30 Minutes Time spent includes review of chart, medications, test results, and follow up plan with the patient.   [place] Controlled Substance Database was reviewed by me.  This patient was seen by Laurence Pons, FNP-C in collaboration with Dr. Verneta Gone as a part of collaborative care agreement.   Arcadio Cope R. Bobbi Burow, MSN, FNP-C Internal medicine

## 2023-06-29 ENCOUNTER — Telehealth: Payer: Self-pay | Admitting: Nurse Practitioner

## 2023-06-29 NOTE — Telephone Encounter (Signed)
 Dermatology referral sent via [facility] to [facility]/[place].  Notified patient. Gave pt telephone# [phone]

## 2023-07-16 ENCOUNTER — Ambulatory Visit (INDEPENDENT_AMBULATORY_CARE_PROVIDER_SITE_OTHER): Payer: Medicare Other

## 2023-07-16 DIAGNOSIS — I639 Cerebral infarction, unspecified: Secondary | ICD-10-CM | POA: Diagnosis not present

## 2023-07-16 LAB — CUP PACEART REMOTE DEVICE CHECK
Date Time Interrogation Session: 20250518233050
Implantable Pulse Generator Implant Date: 20240605

## 2023-07-17 ENCOUNTER — Ambulatory Visit: Payer: Self-pay | Admitting: Cardiology

## 2023-07-26 ENCOUNTER — Telehealth: Payer: Self-pay | Admitting: Nurse Practitioner

## 2023-07-26 NOTE — Telephone Encounter (Signed)
 Dermatology appointment 08/22/2023 @ [facility]

## 2023-07-31 NOTE — Addendum Note (Signed)
 Addended by: Edra Govern D on: 07/31/2023 09:42 AM   Modules accepted: Orders

## 2023-07-31 NOTE — Progress Notes (Signed)
 [facility] Summary Report / Loop Recorder

## 2023-08-01 DIAGNOSIS — L821 Other seborrheic keratosis: Secondary | ICD-10-CM | POA: Diagnosis not present

## 2023-08-01 DIAGNOSIS — L570 Actinic keratosis: Secondary | ICD-10-CM | POA: Diagnosis not present

## 2023-08-06 ENCOUNTER — Telehealth: Payer: Self-pay | Admitting: Nurse Practitioner

## 2023-08-06 NOTE — Telephone Encounter (Signed)
 Left vm and sent mychart message to confirm 08/13/23 appointment-[place]

## 2023-08-13 ENCOUNTER — Other Ambulatory Visit: Payer: Self-pay | Admitting: Cardiovascular Disease

## 2023-08-13 ENCOUNTER — Ambulatory Visit: Payer: Medicare Other | Admitting: Nurse Practitioner

## 2023-08-13 ENCOUNTER — Encounter: Payer: Self-pay | Admitting: Nurse Practitioner

## 2023-08-13 VITALS — BP 103/60 | HR 67 | Temp 97.4°F | Resp 16 | Ht 66.0 in | Wt 184.0 lb

## 2023-08-13 DIAGNOSIS — E782 Mixed hyperlipidemia: Secondary | ICD-10-CM

## 2023-08-13 DIAGNOSIS — E2839 Other primary ovarian failure: Secondary | ICD-10-CM | POA: Diagnosis not present

## 2023-08-13 DIAGNOSIS — Z86711 Personal history of pulmonary embolism: Secondary | ICD-10-CM

## 2023-08-13 DIAGNOSIS — K581 Irritable bowel syndrome with constipation: Secondary | ICD-10-CM

## 2023-08-13 DIAGNOSIS — Z1231 Encounter for screening mammogram for malignant neoplasm of breast: Secondary | ICD-10-CM | POA: Diagnosis not present

## 2023-08-13 DIAGNOSIS — I10 Essential (primary) hypertension: Secondary | ICD-10-CM | POA: Diagnosis not present

## 2023-08-13 DIAGNOSIS — Z1212 Encounter for screening for malignant neoplasm of rectum: Secondary | ICD-10-CM | POA: Diagnosis not present

## 2023-08-13 DIAGNOSIS — Z1211 Encounter for screening for malignant neoplasm of colon: Secondary | ICD-10-CM | POA: Diagnosis not present

## 2023-08-13 DIAGNOSIS — Z0000 Encounter for general adult medical examination without abnormal findings: Secondary | ICD-10-CM

## 2023-08-13 DIAGNOSIS — Z8673 Personal history of transient ischemic attack (TIA), and cerebral infarction without residual deficits: Secondary | ICD-10-CM

## 2023-08-13 DIAGNOSIS — R7303 Prediabetes: Secondary | ICD-10-CM | POA: Diagnosis not present

## 2023-08-13 MED ORDER — VENLAFAXINE HCL ER 75 MG PO CP24
ORAL_CAPSULE | ORAL | 1 refills | Status: DC
Start: 2023-08-13 — End: 2023-12-04

## 2023-08-13 MED ORDER — LUBIPROSTONE 8 MCG PO CAPS: 8.0000 ug | ORAL_CAPSULE | Freq: Two times a day (BID) | ORAL | 3 refills | Status: DC

## 2023-08-13 MED ORDER — SIMVASTATIN 20 MG PO TABS: 20.0000 mg | ORAL_TABLET | Freq: Every day | ORAL | 2 refills | Status: DC

## 2023-08-13 MED ORDER — VALSARTAN 80 MG PO TABS: ORAL_TABLET | ORAL | 2 refills | Status: DC

## 2023-08-13 NOTE — Progress Notes (Signed)
 [facility] [address] [place], [place] [address]  Internal MEDICINE  Office Visit Note  Patient Name: Virginia Phillips  [id number]  [id number]  Date of Service: 08/13/2023  Chief Complaint  Patient presents with   Gastroesophageal Reflux   Hypertension   Hyperlipidemia   Medicare Wellness    HPI Ewa presents for a medicare annual wellness visit..  Well-appearing 78 y.o. female with hypertension, migraines, allergic rhinitis, IBS, prediabetes, and history of stroke and pulmonary embolism.  Routine CRC screening: opt for cologuard  Routine mammogram: due now  DEXA scan: done in 2015 Labs: due for routine labs  New or worsening pain: none Other concerns: none She sees an eye doctor in [place]. Amitiza  is too expensive, need alternative.  Has regular checks on the implanted loop recorder with cardiology.      08/13/2023    1:38 PM 08/10/2022    3:18 PM 08/08/2021    3:12 PM  MMSE - Mini Mental State Exam  Orientation to time 5 5 5   Orientation to Place 5 5 5   Registration 3 3 3   Attention/ Calculation 5 5 5   Recall 3 3 3   Language- name 2 objects 2 2 2   Language- repeat 1 1 1   Language- follow 3 step command 3 3 3   Language- read & follow direction 1 1 1   Write a sentence 1 1 1   Copy design 1 1 1   Total score 30 30 30     Functional Status Survey: Is the patient deaf or have difficulty hearing?: No Does the patient have difficulty seeing, even when wearing glasses/contacts?: Yes Does the patient have difficulty concentrating, remembering, or making decisions?: Yes Does the patient have difficulty walking or climbing stairs?: Yes Does the patient have difficulty dressing or bathing?: No Does the patient have difficulty doing errands alone such as visiting a doctor's office or shopping?: No     12/06/2021   10:00 AM 04/05/2022   10:32 AM 08/10/2022    3:13 PM 01/09/2023    2:14 PM 08/13/2023    1:37 PM  Fall Risk  Falls in the past year?  0 0 0 0   Was there an injury with Fall?  0 0  0  Fall Risk Category Calculator  0 0  0  (RETIRED) Patient Fall Risk Level Low fall risk       Patient at Risk for Falls Due to  No Fall Risks No Fall Risks  No Fall Risks  Fall risk Follow up  Falls evaluation completed Falls evaluation completed  Falls evaluation completed     Data saved with a previous flowsheet row definition       08/13/2023    1:37 PM  Depression screen PHQ 2/9  Decreased Interest 0  Down, Depressed, Hopeless 0  PHQ - 2 Score 0       05/17/2017    3:26 PM  GAD 7 : Generalized Anxiety Score  Nervous, Anxious, on Edge 0  Control/stop worrying 0  Worry too much - different things 0  Trouble relaxing 2  Restless 1  Easily annoyed or irritable 0  Afraid - awful might happen 0  Total GAD 7 Score 3  Anxiety Difficulty Not difficult at all      Current Medication: Outpatient Encounter Medications as of 08/13/2023  Medication Sig   acetaminophen  (TYLENOL ) 650 MG CR tablet Take 650 mg by mouth every 8 (eight) hours as needed for pain.   aspirin  325 MG tablet Take  325 mg by mouth daily.   aspirin -acetaminophen -caffeine (EXCEDRIN MIGRAINE) 250-250-65 MG tablet Take 2 tablets by mouth daily as needed for headache or migraine.   aspirin -sod bicarb-citric acid (ALKA-SELTZER) 325 MG TBEF tablet Take 650 mg by mouth every 6 (six) hours as needed (indigestion).   Biotin 5 MG CAPS Take 5,000 mg by mouth daily at 2 PM.   Cholecalciferol (VITAMIN D ) 2000 UNITS CAPS Take 2,000 Units by mouth daily at 2 PM.    Coenzyme Q10 (CO Q10) 200 MG CAPS Take 200 mg by mouth daily at 2 PM.    ezetimibe  (ZETIA ) 10 MG tablet TAKE 1 TABLET(10 MG) BY MOUTH DAILY   ibuprofen  (ADVIL ) 800 MG tablet Take 1 tablet (800 mg total) by mouth every 8 (eight) hours as needed (joint pains).   ibuprofen  (ADVIL ,MOTRIN ) 200 MG tablet Take 400 mg by mouth daily as needed for headache or moderate pain.   Lancets (ONETOUCH ULTRASOFT) lancets Check sugar once daily  DX 73.9 needs for one touch ultra 2 lancets   lubiprostone  (AMITIZA ) 8 MCG capsule Take 1 capsule (8 mcg total) by mouth 2 (two) times daily with a meal.   Magnesium 400 MG TABS Take by mouth daily.   Misc Natural Products (PETADOLEX 50) 50 MG CAPS Take 100 mg by mouth daily.   multivitamin-lutein (OCUVITE-LUTEIN) CAPS capsule Take 1 capsule by mouth daily.   omega-3 acid ethyl esters (LOVAZA ) 1 g capsule Take 1 capsule (1 g total) by mouth 2 (two) times daily.   omeprazole (PRILOSEC OTC) 20 MG tablet Take 20 mg by mouth daily.   ONE TOUCH ULTRA TEST test strip CHECK BLOOD SUGAR ONCE DAILY AS DIRECTED   pilocarpine  (SALAGEN ) 5 MG tablet Take 5 mg by mouth 2 (two) times daily.   polyethylene glycol (MIRALAX  / GLYCOLAX ) packet Take 17 g by mouth daily.    Polyvinyl Alcohol-Povidone (REFRESH OP) Place 1 drop into both eyes daily as needed (dry eyes).   pyridOXINE (VITAMIN B-6) 100 MG tablet Take 100 mg by mouth daily at 2 PM.   Riboflavin (B2) 100 MG TABS Take 100 mg by mouth 2 (two) times daily.   simvastatin  (ZOCOR ) 20 MG tablet Take 1 tablet (20 mg total) by mouth at bedtime.   Thiamine HCl (VITAMIN B-1) 250 MG tablet Take 250 mg by mouth daily at 2 PM.   valsartan  (DIOVAN ) 80 MG tablet TAKE 1 TABLET(80 MG) BY MOUTH EVERY DAY   venlafaxine  XR (EFFEXOR -XR) 75 MG 24 hr capsule TAKE 1 CAPSULE BY MOUTH DAILY AT 2 PM   vitamin B-12 (CYANOCOBALAMIN ) 1000 MCG tablet Take 1,000 mcg by mouth daily at 2 PM.   vitamin C (ASCORBIC ACID) 500 MG tablet Take 500 mg by mouth daily at 2 PM.   [DISCONTINUED] lubiprostone  (AMITIZA ) 8 MCG capsule TAKE ONE CAPSULE BY MOUTH TWICE DAILY WITH A MEAL   [DISCONTINUED] simvastatin  (ZOCOR ) 20 MG tablet Take 1 tablet (20 mg total) by mouth at bedtime.   [DISCONTINUED] valsartan  (DIOVAN ) 80 MG tablet TAKE 1 TABLET(80 MG) BY MOUTH EVERY DAY   [DISCONTINUED] venlafaxine  XR (EFFEXOR -XR) 75 MG 24 hr capsule TAKE 1 CAPSULE BY MOUTH DAILY AT 2 PM   Facility-Administered  Encounter Medications as of 08/13/2023  Medication   mupirocin  ointment (BACTROBAN ) 2 %    Surgical History: Past Surgical History:  Procedure Laterality Date   ANKLE FRACTURE SURGERY Right 2007   COLONOSCOPY WITH PROPOFOL  N/A 05/10/2017   Procedure: COLONOSCOPY WITH PROPOFOL ;  Surgeon: Janalyn Keene NOVAK, MD;  Location: [facility] ENDOSCOPY;  Service: Endoscopy;  Laterality: N/A;   ESOPHAGOGASTRODUODENOSCOPY (EGD) WITH PROPOFOL  N/A 10/17/2017   Procedure: ESOPHAGOGASTRODUODENOSCOPY (EGD) WITH PROPOFOL ;  Surgeon: Janalyn Keene NOVAK, MD;  Location: [facility] ENDOSCOPY;  Service: Endoscopy;  Laterality: N/A;   GANGLION CYST EXCISION Right    wrist   HERNIA REPAIR     2011   KNEE ARTHROSCOPY W/ ACL RECONSTRUCTION Right 2008   KNEE SURGERY  08/05/2007   LASIK     NISSEN FUNDOPLICATION  2012    Medical History: Past Medical History:  Diagnosis Date   Coronary artery disease    GERD (gastroesophageal reflux disease)    Glaucoma    Headache    migraines   History of shingles    Hyperlipidemia    Hypertension    Pre-diabetes    Pulmonary embolism on right [facility]) 2007   right lung   Sjogren's syndrome (HCC)    Stroke (HCC) 1998    Family History: Family History  Problem Relation Age of Onset   Congestive Heart Failure Mother    Parkinson's disease Mother    Diabetes Father    Scleroderma Sister    Cancer Brother     Social History   Socioeconomic History   Marital status: Married    Spouse name: Not on file   Number of children: 1   Years of education: Not on file   Highest education level: Some college, no degree  Occupational History   Occupation: retired  Tobacco Use   Smoking status: Never   Smokeless tobacco: Never  Vaping Use   Vaping status: Never Used  Substance and Sexual Activity   Alcohol use: No    Alcohol/week: 0.0 standard drinks of alcohol   Drug use: No   Sexual activity: Not on file  Other Topics Concern   Not on file  Social History Narrative    Not on file   Social Drivers of Health   Financial Resource Strain: Low Risk  (06/14/2017)   Overall Financial Resource Strain (CARDIA)    Difficulty of Paying Living Expenses: Not hard at all  Food Insecurity: No Food Insecurity (12/05/2021)   Hunger Vital Sign    Worried About Running Out of Food in the Last Year: Never true    Ran Out of Food in the Last Year: Never true  Transportation Needs: No Transportation Needs (12/05/2021)   PRAPARE - Administrator, Civil Service (Medical): No    Lack of Transportation (Non-Medical): No  Physical Activity: Not on file  Stress: No Stress Concern Present (06/14/2017)   [employer] of Occupational Health - Occupational Stress Questionnaire    Feeling of Stress : Only a little  Social Connections: Not on file  Intimate Partner Violence: Not At Risk (12/05/2021)   Humiliation, Afraid, Rape, and Kick questionnaire    Fear of Current or Ex-Partner: No    Emotionally Abused: No    Physically Abused: No    Sexually Abused: No      Review of Systems  Constitutional:  Positive for fatigue. Negative for activity change, appetite change, chills, fever and unexpected weight change.  HENT: Negative.  Negative for congestion, ear pain, rhinorrhea, sore throat and trouble swallowing.   Eyes: Negative.   Respiratory: Negative.  Negative for cough, chest tightness, shortness of breath and wheezing.   Cardiovascular: Negative.  Negative for chest pain and palpitations.  Gastrointestinal: Negative.  Negative for abdominal pain, blood in stool, constipation, diarrhea, nausea and vomiting.  Endocrine:  Negative.   Genitourinary: Negative.  Negative for difficulty urinating, dysuria, frequency, hematuria and urgency.  Musculoskeletal: Negative.  Negative for arthralgias, back pain, joint swelling, myalgias and neck pain.  Skin: Negative.  Negative for rash and wound.  Allergic/Immunologic: Negative.  Negative for immunocompromised state.   Neurological: Negative.  Negative for dizziness, seizures, numbness and headaches.  Hematological: Negative.   Psychiatric/Behavioral:  Positive for confusion and decreased concentration. Negative for behavioral problems, self-injury, sleep disturbance and suicidal ideas. The patient is not nervous/anxious.        Memory issues    Vital Signs: BP 103/60   Pulse 67   Temp (!) 97.4 F (36.3 C)   Resp 16   Ht 5' 6 (1.676 m)   Wt 184 lb (83.5 kg)   SpO2 98%   BMI 29.70 kg/m    Physical Exam Vitals reviewed.  Constitutional:      General: She is awake. She is not in acute distress.    Appearance: Normal appearance. She is well-developed and well-groomed. She is not ill-appearing or diaphoretic.  HENT:     Head: Normocephalic and atraumatic.     Right Ear: Tympanic membrane, ear canal and external ear normal.     Left Ear: Tympanic membrane, ear canal and external ear normal.     Nose: Nose normal. No congestion or rhinorrhea.     Mouth/Throat:     Lips: Pink.     Mouth: Mucous membranes are moist.     Pharynx: Oropharynx is clear. Uvula midline. No oropharyngeal exudate or posterior oropharyngeal erythema.  Eyes:     General: Lids are normal. Vision grossly intact. Gaze aligned appropriately. No scleral icterus.       Right eye: No discharge.        Left eye: No discharge.     Extraocular Movements: Extraocular movements intact.     Conjunctiva/sclera: Conjunctivae normal.     Pupils: Pupils are equal, round, and reactive to light.     Funduscopic exam:    Right eye: Red reflex present.        Left eye: Red reflex present. Neck:     Thyroid : No thyromegaly.     Vascular: No JVD.     Trachea: Trachea and phonation normal. No tracheal deviation.  Cardiovascular:     Rate and Rhythm: Normal rate and regular rhythm.     Heart sounds: Normal heart sounds, S1 normal and S2 normal. No murmur heard.    No friction rub. No gallop.  Pulmonary:     Effort: Pulmonary effort is  normal. No accessory muscle usage or respiratory distress.     Breath sounds: Normal breath sounds and air entry. No stridor. No wheezing or rales.  Chest:     Chest wall: No swelling or tenderness.  Breasts:    Right: Normal. No swelling, bleeding, inverted nipple, mass, nipple discharge, skin change or tenderness.     Left: Normal. No swelling, bleeding, inverted nipple, mass, nipple discharge, skin change or tenderness.  Abdominal:     General: Bowel sounds are normal. There is no distension.     Palpations: Abdomen is soft. There is no mass.     Tenderness: There is no abdominal tenderness. There is no guarding or rebound.  Musculoskeletal:        General: No tenderness or deformity. Normal range of motion.     Cervical back: Normal range of motion and neck supple.     Right lower leg: 1+ Pitting Edema  present.     Left lower leg: 1+ Pitting Edema present.  Lymphadenopathy:     Cervical: No cervical adenopathy.     Upper Body:     Right upper body: No supraclavicular, axillary or pectoral adenopathy.     Left upper body: No supraclavicular, axillary or pectoral adenopathy.  Skin:    General: Skin is warm and dry.     Capillary Refill: Capillary refill takes less than 2 seconds.     Coloration: Skin is not pale.     Findings: No erythema or rash.  Neurological:     Mental Status: She is alert and oriented to person, place, and time.     Cranial Nerves: No cranial nerve deficit.     Motor: No abnormal muscle tone.     Coordination: Coordination normal.     Gait: Gait normal.     Deep Tendon Reflexes: Reflexes are normal and symmetric.  Psychiatric:        Mood and Affect: Mood and affect normal.        Speech: Speech is delayed.        Behavior: Behavior is slowed. Behavior is cooperative.        Thought Content: Thought content normal. Thought content is not paranoid or delusional. Thought content does not include homicidal or suicidal ideation.        Cognition and Memory:  Memory is impaired. She exhibits impaired recent memory.        Judgment: Judgment normal.        Assessment/Plan: 1. Encounter for subsequent annual wellness visit (AWV) in Medicare patient (Primary) Age-appropriate preventive screenings and vaccinations discussed. Routine labs for health maintenance will be ordered. PHM updated.    2. Essential hypertension Stable continue medications as prescribed.   3. Mixed hyperlipidemia Continue zetia , simvastatin  and OTC fish oil supplement as prescribed.  4. Prediabetes Due for labs, will check a1c  5. Irritable bowel syndrome with constipation Amitiza  is too expensive. Continue miralax  as needed. Continue magnesium supplement.    6. 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 History of stroke Followed by neurology and cardiology   8. History of pulmonary embolism Noted, followed by cardiology   9. Encounter for screening mammogram for malignant neoplasm of breast Routine mammogram ordered  - MM 3D SCREENING MAMMOGRAM BILATERAL BREAST; Future  10. Screening for colorectal cancer Cologuard test ordered  - Cologuard - Cologuard    General Counseling: Icesis verbalizes understanding of the findings of todays visit and agrees with plan of treatment. I have discussed any further diagnostic evaluation that may be needed or ordered today. We also reviewed her medications today. she has been encouraged to call the office with any questions or concerns that should arise related to todays visit.    Orders Placed This Encounter  Procedures   MM 3D SCREENING MAMMOGRAM BILATERAL BREAST   Cologuard    Meds ordered this encounter  Medications   lubiprostone  (AMITIZA ) 8 MCG capsule    Sig: Take 1 capsule (8 mcg total) by mouth 2 (two) times daily with a meal.     Dispense:  180 capsule    Refill:  3    **Patient requests 90 days supply**   simvastatin  (ZOCOR ) 20 MG tablet    Sig: Take 1 tablet (20 mg total)  by mouth at bedtime.    Dispense:  90 tablet    Refill:  2   venlafaxine  XR (EFFEXOR -XR) 75 MG 24 hr capsule    Sig: TAKE 1 CAPSULE BY MOUTH DAILY AT 2 PM    Dispense:  90 capsule    Refill:  1    For future refills   valsartan  (DIOVAN ) 80 MG tablet    Sig: TAKE 1 TABLET(80 MG) BY MOUTH EVERY DAY    Dispense:  90 tablet    Refill:  2    Return for f/u lab results with Jennett Tarbell pcp in 2-3 weeks..   Total time spent:30 Minutes Time spent includes review of chart, medications, test results, and follow up plan with the patient.   [place] Controlled Substance Database was reviewed by me.  This patient was seen by Mardy Maxin, FNP-C in collaboration with Dr. Sigrid Bathe as a part of collaborative care agreement.  Garret Teale R. Maxin, MSN, FNP-C Internal medicine

## 2023-08-15 ENCOUNTER — Other Ambulatory Visit: Payer: Self-pay | Admitting: Nurse Practitioner

## 2023-08-15 DIAGNOSIS — E538 Deficiency of other specified B group vitamins: Secondary | ICD-10-CM | POA: Diagnosis not present

## 2023-08-15 DIAGNOSIS — E782 Mixed hyperlipidemia: Secondary | ICD-10-CM | POA: Diagnosis not present

## 2023-08-15 DIAGNOSIS — R7303 Prediabetes: Secondary | ICD-10-CM | POA: Diagnosis not present

## 2023-08-15 DIAGNOSIS — E559 Vitamin D deficiency, unspecified: Secondary | ICD-10-CM | POA: Diagnosis not present

## 2023-08-16 ENCOUNTER — Ambulatory Visit (INDEPENDENT_AMBULATORY_CARE_PROVIDER_SITE_OTHER)

## 2023-08-16 DIAGNOSIS — I639 Cerebral infarction, unspecified: Secondary | ICD-10-CM

## 2023-08-16 LAB — B12 AND FOLATE PANEL
Folate: 14.8 ng/mL (ref >3.0)
Vitamin B-12: 1713 pg/mL — ABNORMAL HIGH (ref 232–1245)

## 2023-08-16 LAB — CBC WITH DIFFERENTIAL/PLATELET
Basophils Absolute: 0.1 10*3/uL (ref 0.0–0.2)
Basos: 1 %
EOS (ABSOLUTE): 0.2 10*3/uL (ref 0.0–0.4)
Eos: 3 %
Hematocrit: 43.8 % (ref 34.0–46.6)
Hemoglobin: 14.3 g/dL (ref 11.1–15.9)
Immature Grans (Abs): 0 10*3/uL (ref 0.0–0.1)
Immature Granulocytes: 0 %
Lymphocytes Absolute: 2.2 10*3/uL (ref 0.7–3.1)
Lymphs: 36 %
MCH: 31.8 pg (ref 26.6–33.0)
MCHC: 32.6 g/dL (ref 31.5–35.7)
MCV: 97 fL (ref 79–97)
Monocytes Absolute: 0.6 10*3/uL (ref 0.1–0.9)
Monocytes: 9 %
Neutrophils Absolute: 3.1 10*3/uL (ref 1.4–7.0)
Neutrophils: 51 %
Platelets: 280 10*3/uL (ref 150–450)
RBC: 4.5 x10E6/uL (ref 3.77–5.28)
RDW: 12.1 % (ref 11.7–15.4)
WBC: 6.2 10*3/uL (ref 3.4–10.8)

## 2023-08-16 LAB — COMPREHENSIVE METABOLIC PANEL WITH GFR
ALT: 24 IU/L (ref 0–32)
AST: 22 IU/L (ref 0–40)
Albumin: 4.6 g/dL (ref 3.8–4.8)
Alkaline Phosphatase: 55 IU/L (ref 44–121)
BUN/Creatinine Ratio: 16 (ref 12–28)
BUN: 15 mg/dL (ref 8–27)
Bilirubin Total: <0.2 mg/dL (ref 0.0–1.2)
CO2: 22 mmol/L (ref 20–29)
Calcium: 9.9 mg/dL (ref 8.7–10.3)
Chloride: 101 mmol/L (ref 96–106)
Creatinine, Ser: 0.91 mg/dL (ref 0.57–1.00)
Globulin, Total: 2.2 g/dL (ref 1.5–4.5)
Glucose: 128 mg/dL — ABNORMAL HIGH (ref 70–99)
Potassium: 5.1 mmol/L (ref 3.5–5.2)
Sodium: 141 mmol/L (ref 134–144)
Total Protein: 6.8 g/dL (ref 6.0–8.5)
eGFR: 65 mL/min/{1.73_m2} (ref >59)

## 2023-08-16 LAB — LIPID PANEL
Chol/HDL Ratio: 2.5 ratio (ref 0.0–4.4)
Cholesterol, Total: 155 mg/dL (ref 100–199)
HDL: 61 mg/dL (ref >39)
LDL Chol Calc (NIH): 74 mg/dL (ref 0–99)
Triglycerides: 112 mg/dL (ref 0–149)
VLDL Cholesterol Cal: 20 mg/dL (ref 5–40)

## 2023-08-16 LAB — CUP PACEART REMOTE DEVICE CHECK
Date Time Interrogation Session: 20250618232530
Implantable Pulse Generator Implant Date: 20240605

## 2023-08-16 LAB — HGB A1C W/O EAG: Hgb A1c MFr Bld: 6.3 % — ABNORMAL HIGH (ref 4.8–5.6)

## 2023-08-16 LAB — VITAMIN D 25 HYDROXY (VIT D DEFICIENCY, FRACTURES): Vit D, 25-Hydroxy: 55.3 ng/mL (ref 30.0–100.0)

## 2023-08-17 ENCOUNTER — Ambulatory Visit: Payer: Self-pay | Admitting: Cardiology

## 2023-08-30 LAB — COLOGUARD

## 2023-09-04 NOTE — Progress Notes (Signed)
 [facility] Summary Report / Loop Recorder

## 2023-09-05 ENCOUNTER — Ambulatory Visit: Admitting: Nurse Practitioner

## 2023-09-06 DIAGNOSIS — Z1211 Encounter for screening for malignant neoplasm of colon: Secondary | ICD-10-CM | POA: Diagnosis not present

## 2023-09-06 DIAGNOSIS — Z1212 Encounter for screening for malignant neoplasm of rectum: Secondary | ICD-10-CM | POA: Diagnosis not present

## 2023-09-14 ENCOUNTER — Encounter: Payer: Self-pay | Admitting: Nurse Practitioner

## 2023-09-14 DIAGNOSIS — Z86711 Personal history of pulmonary embolism: Secondary | ICD-10-CM | POA: Insufficient documentation

## 2023-09-17 ENCOUNTER — Ambulatory Visit

## 2023-09-17 DIAGNOSIS — I639 Cerebral infarction, unspecified: Secondary | ICD-10-CM

## 2023-09-18 LAB — CUP PACEART REMOTE DEVICE CHECK
Date Time Interrogation Session: 20250720234019
Implantable Pulse Generator Implant Date: 20240605

## 2023-09-18 LAB — COLOGUARD: COLOGUARD: NEGATIVE

## 2023-09-20 ENCOUNTER — Ambulatory Visit: Payer: Self-pay | Admitting: Cardiology

## 2023-10-04 ENCOUNTER — Ambulatory Visit: Admitting: Nurse Practitioner

## 2023-10-09 ENCOUNTER — Ambulatory Visit
Admission: RE | Admit: 2023-10-09 | Discharge: 2023-10-09 | Disposition: A | Discharge status: Home / Self Care | Payer: Self-pay | Source: Ambulatory Visit | Attending: Nurse Practitioner | Admitting: Nurse Practitioner

## 2023-10-09 ENCOUNTER — Other Ambulatory Visit: Payer: Self-pay | Admitting: *Deleted

## 2023-10-09 ENCOUNTER — Ambulatory Visit
Admission: RE | Admit: 2023-10-09 | Discharge: 2023-10-09 | Disposition: A | Discharge status: Home / Self Care | Source: Ambulatory Visit | Attending: Nurse Practitioner | Admitting: Nurse Practitioner

## 2023-10-09 DIAGNOSIS — Z1231 Encounter for screening mammogram for malignant neoplasm of breast: Secondary | ICD-10-CM

## 2023-10-12 DIAGNOSIS — Z961 Presence of intraocular lens: Secondary | ICD-10-CM | POA: Diagnosis not present

## 2023-10-12 DIAGNOSIS — H02889 Meibomian gland dysfunction of unspecified eye, unspecified eyelid: Secondary | ICD-10-CM | POA: Diagnosis not present

## 2023-10-12 DIAGNOSIS — H1013 Acute atopic conjunctivitis, bilateral: Secondary | ICD-10-CM | POA: Diagnosis not present

## 2023-10-12 DIAGNOSIS — M3501 Sicca syndrome with keratoconjunctivitis: Secondary | ICD-10-CM | POA: Diagnosis not present

## 2023-10-17 ENCOUNTER — Emergency Department

## 2023-10-17 ENCOUNTER — Emergency Department
Admission: EM | Admit: 2023-10-17 | Discharge: 2023-10-17 | Disposition: A | Discharge status: Home / Self Care | Attending: Emergency Medicine | Admitting: Emergency Medicine

## 2023-10-17 ENCOUNTER — Other Ambulatory Visit: Payer: Self-pay

## 2023-10-17 DIAGNOSIS — R9431 Abnormal electrocardiogram [ECG] [EKG]: Secondary | ICD-10-CM | POA: Insufficient documentation

## 2023-10-17 DIAGNOSIS — R519 Headache, unspecified: Secondary | ICD-10-CM | POA: Diagnosis not present

## 2023-10-17 DIAGNOSIS — I10 Essential (primary) hypertension: Secondary | ICD-10-CM | POA: Insufficient documentation

## 2023-10-17 DIAGNOSIS — R9089 Other abnormal findings on diagnostic imaging of central nervous system: Secondary | ICD-10-CM | POA: Diagnosis not present

## 2023-10-17 DIAGNOSIS — R202 Paresthesia of skin: Secondary | ICD-10-CM | POA: Diagnosis not present

## 2023-10-17 DIAGNOSIS — Z7982 Long term (current) use of aspirin: Secondary | ICD-10-CM | POA: Insufficient documentation

## 2023-10-17 DIAGNOSIS — I6782 Cerebral ischemia: Secondary | ICD-10-CM | POA: Insufficient documentation

## 2023-10-17 DIAGNOSIS — Z8673 Personal history of transient ischemic attack (TIA), and cerebral infarction without residual deficits: Secondary | ICD-10-CM | POA: Insufficient documentation

## 2023-10-17 DIAGNOSIS — R448 Other symptoms and signs involving general sensations and perceptions: Secondary | ICD-10-CM | POA: Insufficient documentation

## 2023-10-17 LAB — COMPREHENSIVE METABOLIC PANEL WITH GFR
ALT: 26 U/L (ref 0–44)
AST: 26 U/L (ref 15–41)
Albumin: 3.9 g/dL (ref 3.5–5.0)
Alkaline Phosphatase: 39 U/L (ref 38–126)
Anion gap: 12 (ref 5–15)
BUN: 13 mg/dL (ref 8–23)
CO2: 26 mmol/L (ref 22–32)
Calcium: 9.1 mg/dL (ref 8.9–10.3)
Chloride: 103 mmol/L (ref 98–111)
Creatinine, Ser: 0.83 mg/dL (ref 0.44–1.00)
GFR, Estimated: >60 mL/min (ref >60)
Glucose, Bld: 128 mg/dL — ABNORMAL HIGH (ref 70–99)
Potassium: 4.5 mmol/L (ref 3.5–5.1)
Sodium: 141 mmol/L (ref 135–145)
Total Bilirubin: 0.3 mg/dL (ref 0.0–1.2)
Total Protein: 6.8 g/dL (ref 6.5–8.1)

## 2023-10-17 LAB — CBC
HCT: 44.1 % (ref 36.0–46.0)
Hemoglobin: 14.6 g/dL (ref 12.0–15.0)
MCH: 32 pg (ref 26.0–34.0)
MCHC: 33.1 g/dL (ref 30.0–36.0)
MCV: 96.7 fL (ref 80.0–100.0)
Platelets: 262 K/uL (ref 150–400)
RBC: 4.56 MIL/uL (ref 3.87–5.11)
RDW: 12.8 % (ref 11.5–15.5)
WBC: 5.7 K/uL (ref 4.0–10.5)
nRBC: 0 % (ref 0.0–0.2)

## 2023-10-17 LAB — DIFFERENTIAL
Abs Immature Granulocytes: 0.01 K/uL (ref 0.00–0.07)
Basophils Absolute: 0.1 K/uL (ref 0.0–0.1)
Basophils Relative: 1 %
Eosinophils Absolute: 0.1 K/uL (ref 0.0–0.5)
Eosinophils Relative: 2 %
Immature Granulocytes: 0 %
Lymphocytes Relative: 39 %
Lymphs Abs: 2.2 K/uL (ref 0.7–4.0)
Monocytes Absolute: 0.5 K/uL (ref 0.1–1.0)
Monocytes Relative: 9 %
Neutro Abs: 2.8 K/uL (ref 1.7–7.7)
Neutrophils Relative %: 49 %

## 2023-10-17 LAB — TROPONIN I (HIGH SENSITIVITY): Troponin I (High Sensitivity): 4 ng/L (ref <18)

## 2023-10-17 LAB — PROTIME-INR
INR: 1 (ref 0.8–1.2)
Prothrombin Time: 13.2 s (ref 11.4–15.2)

## 2023-10-17 LAB — APTT: aPTT: 29 s (ref 24–36)

## 2023-10-17 LAB — ETHANOL: Alcohol, Ethyl (B): <15 mg/dL (ref <15)

## 2023-10-17 MED ORDER — ACETAMINOPHEN 500 MG PO TABS
ORAL_TABLET | ORAL | Status: AC
  Filled 2023-10-17: qty 1

## 2023-10-17 MED ORDER — ACETAMINOPHEN 500 MG PO TABS
1000.0000 mg | ORAL_TABLET | Freq: Once | ORAL | Status: AC
  Administered 2023-10-17: 1000 mg via ORAL
  Filled 2023-10-17: qty 2

## 2023-10-17 NOTE — ED Notes (Signed)
 Patient transported to MRI

## 2023-10-17 NOTE — Discharge Instructions (Addendum)
 Follow-up with neurology and return to the ER for worsening symptoms, fevers or any other concerns  IMPRESSION: 1. No acute intracranial abnormality. 2. Early confluent hyperintense T2-weighted signal within the cerebral white matter, most commonly due to chronic small vessel disease. 3. Mild volume loss.

## 2023-10-17 NOTE — Progress Notes (Signed)
 [facility] Summary Report / Loop Recorder

## 2023-10-17 NOTE — ED Triage Notes (Signed)
 Pt comes in via pov with complaints of facial tingling and intermittent headaches that started 4 days ago. Pt has no complaints of pain at this time. Pt states that she has a history of hypertension and stroke. Pt reports that she hasn't taken any of her medications today. Pt with no reports of dizziness, weakness, or any other deficits at this time.

## 2023-10-17 NOTE — ED Provider Notes (Addendum)
 [facility] Provider Note    Event Date/Time   First MD Initiated Contact with Patient 10/17/23 1331     (approximate)   History   facial tingling   HPI  Virginia Phillips is a 78 y.o. female with history of loop recorder who comes in with concerns for facial tingling.  Patient reports having a headache that started 4 days ago.  This has since gotten improved.  She does have a history of hypertension, stroke but is not taking her medications.  She reports some facial tingling.  She states that is not really a headache she reports is just more like a pressure that goes across her sinuses into her head.  She reports that is on both sides of her head not focused on one side.  She does report a little bit of tingling in her left hand and a little bit of dizziness even at rest.  She denies any changes in her vision.   She reports the pain is mild and gradually increasing does not sound severe.  Ask about a headache she really denies having a headache she just reports a strange sensation over her face.  Does report a history of multiple strokes and does take aspirin .   Physical Exam   Triage Vital Signs: ED Triage Vitals  Encounter Vitals Group     BP 10/17/23 1246 130/82     Girls Systolic BP Percentile --      Girls Diastolic BP Percentile --      Boys Systolic BP Percentile --      Boys Diastolic BP Percentile --      Pulse Rate 10/17/23 1246 79     Resp 10/17/23 1246 19     Temp 10/17/23 1246 97.7 F (36.5 C)     Temp src --      SpO2 10/17/23 1246 96 %     Weight 10/17/23 1247 184 lb 1.4 oz (83.5 kg)     Height 10/17/23 1247 5' 6 (1.676 m)     Head Circumference --      Peak Flow --      Pain Score 10/17/23 1247 0     Pain Loc --      Pain Education --      Exclude from Growth Chart --     Most recent vital signs: Vitals:   10/17/23 1246  BP: 130/82  Pulse: 79  Resp: 19  Temp: 97.7 F (36.5 C)  SpO2: 96%     General: Awake, no distress.   CV:  Good peripheral perfusion.  Resp:  Normal effort.  Abd:  No distention.  Other:  Patient has no tenderness along her temporal arteries she denies any blurred vision.  She reports that just a pressure along her forehead.  TMs are clear bilaterally.  She reports a little bit of dizziness with eyes closing.  Does report a little bit of tingling in her hand.    ED Results / Procedures / Treatments   Labs (all labs ordered are listed, but only abnormal results are displayed) Labs Reviewed  COMPREHENSIVE METABOLIC PANEL WITH GFR - Abnormal; Notable for the following components:      Result Value   Glucose, Bld 128 (*)    All other components within normal limits  PROTIME-INR  APTT  CBC  DIFFERENTIAL  ETHANOL     EKG  My interpretation of EKG:  Normal sinus rate of 71 without any ST elevation, T wave version  in lead III, normal intervals  RADIOLOGY I have reviewed the ct personally and interpreted no evidence of intracranial hemorrhage, some remote infarct   PROCEDURES:  Critical Care performed: No  Procedures   MEDICATIONS ORDERED IN ED: Medications - No data to display   IMPRESSION / MDM / ASSESSMENT AND PLAN / ED COURSE  I reviewed the triage vital signs and the nursing notes.   Patient's presentation is most consistent with acute presentation with potential threat to life or bodily function.   Patient comes in with some head discomfort with some mild dizziness and mild pressure in her sinus area.  She denies really a lot of significant nasal congestion but I wonder if this could be like a sinusitis picture.  Does not sound consistent with aneurysm was not sudden severe in onset.  Consider the possibility of temporal arteritis but she reports that it is a sensation of this on both sides of her head.  She denies it being focal to one side.  She also denies any changes in vision and has no pain over her temporal arteries.  It seems not to be consistent with stroke  given she reports some tingling on the hand as well as some dizziness this would be different areas of the brain however she does report history of multiple strokes in the past that have been missed and so we will do CT imaging to rule out intercranial hemorrhage and MRI to rule out any type of stroke.  Cardiac marker was ordered no evidence of ACS has been ongoing for a few days.  Coags are normal CBC reassuring CMP reassuring.  When I went to go reevaluate patient she states that can she take her medication that she normally takes for this.  Some supplement that her neurologist prescribed her.  I asked patient and she stated that she actually has had this happen before and she has seen neurology for previously contrary to her telling me before that she had never had this before when I first saw her.  It could also still be some mild sinusitis but given no fever no white count no findings on CT imaging I think we should hold off on starting antibiotics.  She reports having follow-up with Dr. Lane next week and she states that she was mostly here just to rule out any type of stroke.  If MRI is negative I suspect patient can be discharged home and follow-up outpatient with neurology  IMPRESSION: 1. No acute intracranial abnormality. 2. Early confluent hyperintense T2-weighted signal within the cerebral white matter, most commonly due to chronic small vessel disease. 3. Mild volume loss.  Patient MRI is as above.  Did want to discuss the case with neurology just given this weird hyperintense lesion I am never seen something being called a early lesion but then being called chronic so I just want to run it by neurology to ensure they do not think this represents a new stroke.  Did message Dr. Arora but he is currently in a thrombectomy case.  I added on Dr. Jossie to chat and as long as neurology gives the all clear plan we will discharge patient.  Patient's nurse is also aware that patient is on a  discharge hold just pending confirming with neurology that this is nothing acute and then pt can be dc and followed up outpatient.   FINAL CLINICAL IMPRESSION(S) / ED DIAGNOSES   Final diagnoses:  Facial pressure     Rx / DC Orders  ED Discharge Orders     None        Note:  This document was prepared using Dragon voice recognition software and may include unintentional dictation errors.   Ernest Ronal BRAVO, MD 10/17/23 1512    Ernest Ronal BRAVO, MD 10/17/23 1536    Ernest Ronal BRAVO, MD 10/17/23 [phone]

## 2023-10-18 ENCOUNTER — Ambulatory Visit (INDEPENDENT_AMBULATORY_CARE_PROVIDER_SITE_OTHER)

## 2023-10-18 DIAGNOSIS — I639 Cerebral infarction, unspecified: Secondary | ICD-10-CM

## 2023-10-18 LAB — CUP PACEART REMOTE DEVICE CHECK
Date Time Interrogation Session: 20250820234912
Implantable Pulse Generator Implant Date: 20240605

## 2023-10-19 ENCOUNTER — Ambulatory Visit: Payer: Self-pay | Admitting: Cardiology

## 2023-10-23 DIAGNOSIS — R0683 Snoring: Secondary | ICD-10-CM | POA: Diagnosis not present

## 2023-10-23 DIAGNOSIS — N951 Menopausal and female climacteric states: Secondary | ICD-10-CM | POA: Diagnosis not present

## 2023-10-23 DIAGNOSIS — R531 Weakness: Secondary | ICD-10-CM | POA: Diagnosis not present

## 2023-10-23 DIAGNOSIS — R42 Dizziness and giddiness: Secondary | ICD-10-CM | POA: Diagnosis not present

## 2023-10-23 DIAGNOSIS — R2689 Other abnormalities of gait and mobility: Secondary | ICD-10-CM | POA: Diagnosis not present

## 2023-10-23 DIAGNOSIS — R4189 Other symptoms and signs involving cognitive functions and awareness: Secondary | ICD-10-CM | POA: Diagnosis not present

## 2023-10-23 DIAGNOSIS — Z9889 Other specified postprocedural states: Secondary | ICD-10-CM | POA: Diagnosis not present

## 2023-10-23 DIAGNOSIS — Z8673 Personal history of transient ischemic attack (TIA), and cerebral infarction without residual deficits: Secondary | ICD-10-CM | POA: Diagnosis not present

## 2023-10-23 DIAGNOSIS — R519 Headache, unspecified: Secondary | ICD-10-CM | POA: Diagnosis not present

## 2023-10-23 NOTE — Progress Notes (Signed)
 Today the history is gathered from: 100% - patient  0% - husband  GOES BY Virginia Phillips  RECORDS SUMMARY: I have reviewed the note dated 12/20/2021 from Cadence Furth who has indicated:  Patient reported to ER for left arm weakness. MRI showed acute stroke.   Given these abnormal neurologic findings, a referral to neurology has been recommended.  REFERRING PHYSICIAN: Pcp PRIMARY CARE PHYSICIAN:  [facility]  IMPRESSION/PLAN  Virginia Phillips is a 78 y.o. female presenting for evaluation of  HISTORY OF CVA / LEFT ARM WEAKNESS/ HEADACHES/ LIGHTHEADEDNESS/ KNEE PAIN/ FEET PAIN/ IMBALANCE/ PRESENCE OF LOOP RECORDER - Ongoing. - Patient with recent ER visit on 10/17/2023 for headache lasting 4 days with associated facial tingling, lightheadedness and pressure. Residual tingling at bilateral buccal region and lightheadedness. Reduced brain fog, head pain and pressure. Unchanged imbalance, unstable gait. Occasionally using cane. - Reviewed MRI Brain findings from 10/17/2023, overall unremarkable with findings of age related changes. - Continue Maxalt 10 mg as needed for headache rescue. Patient will call for refills. - Continue Aspirin  325 mg daily for secondary stroke prevention as instructed by other provider. - Continue Effexor  XR 75 mg daily. Refill.  - If experiencing stroke like symptoms instructed patient to call 911 as soon as symptoms start to be considered a possible candidate for tPA. Common stroke like symptoms and signs to look out for are one sided weakness, facial droop, slurred speech, dizziness, imbalance, vision changes, confusion. Patient acknowledged understanding.   Medications previously tried: Nortriptyline (made face red, swelling, and worsened migraines) Plavix  Gabapentin   Follow-up with Dr. Lane in 4-6 months.  p=4  CHIEF COMPLAINT & HPI  Virginia Phillips is a 78 y.o. female presenting for evaluation of: Chief Complaint  Patient presents with  . HISTORY OF CVA / LEFT  ARM WEAKNESS/ HEADACHES  . LIGHTHEADEDNESS/ KNEE PAIN/ FEET PAIN/ IMBALANCE  . PRESENCE OF LOOP RECORDER    HISTORY OF CVA / LEFT ARM WEAKNESS/ HEADACHES/ LIGHTHEADEDNESS/ KNEE PAIN/ FEET PAIN/ IMBALANCE/ PRESENCE OF LOOP RECORDER Patient is here today to discuss MRI findings from 10/17/2023. ER visit on 10/17/2023 for bilateral temporal headache lasting 4 days with pressure sensation, lightheadedness and tingling about face. Mild dizziness and tingling in left hand. No vision change. Was compliant blood thinner and anti-hypertensive and cholesterol medication medication. Ongoing tingling at bilateral buccal region and lightheadedness. Reduced brain fog, head pain, pressure. Unchanged imbalance and unstable gait. Occasional use of cane to ambulate. No recent knee pain. No recent falls. Taking Effexor  XR 75 mg daily, seldomly taking Maxalt 10 mg as needed, Aspirin  325 mg daily, OTC Butterbur root extract 50 mg daily.  DATA SUMMARY: 10/17/2023 MR BRAIN WO IMPRESSION:  1. No acute intracranial abnormality.  2. Early confluent hyperintense T2-weighted signal within the cerebral white  matter, most commonly due to chronic small vessel disease.  3. Mild volume loss.   10/17/2023 CT HEAD WO IMPRESSION:  1. No acute intracranial abnormality.  2. Extensive chronic microvascular ischemic changes and mild generalized volume  loss.  3. Remote cortical infarct in the right occipital lobe and small remote infarct  in the left cerebellum.   12/29/2022 MR BRAIN WO CONTRAST IMPRESSION:  1. No evidence of an acute intracranial abnormality.  2. Small chronic cortical infarct within the right occipital lobe  (PCA vascular territory), new from the prior brain MRI of  12/05/2021.  3. Chronic small vessel ischemic changes within the cerebral white  matter and pons, overall moderate-to-advanced. These findings are  similar  to the prior MRI.  4. Small chronic infarct within the left cerebellar hemisphere,   unchanged.  5. Mild-to-moderate generalized cerebral atrophy.   08/14/2022 CT HEAD WO CONTRAST IMPRESSION:  1. No acute intracranial process.  2. Stable mild diffuse atrophy and mild periventricular white matter  hypodensity, likely chronic microvascular disease.  3. Old lacunar infarct in the right basal ganglia.   06/16/2022 CT HEAD WO CONTRAST IMPRESSION: 1. No acute intracranial abnormality. 2. Interval progression of severe chronic small-vessel disease.    12/05/2021 MR BRAIN WO/ MR ANGIO HEAD WO/ MR ANGIO NECK WWO IMPRESSION:  1. Punctate acute infarct in the right parietal lobe. No evidence of  hemorrhage.  2. No intracranial occlusion or significant stenosis in the neck.   12/05/2021 MR CERVICAL SPINE WO IMPRESSION:  Cervical spondylosis, as outlined. No more than mild spinal canal  narrowing. Multifactorial foraminal stenosis bilaterally at C6-C7  (mild right, moderate left). Disc degeneration is greatest at C6-C7  (moderate-to-advanced with associated mild degenerative endplate  edema at this level).   Grade 1 anterolisthesis at C3-C4, C4-C5 C5-C6, C7-T1, T1-T2 and  T2-T3.   VISIT SUMMARIES: 12/25/2022: Patient with recent pressure about the entire head, lightheadedness, and brain fog. Pain noted about bilateral temporal lobes. Ongoing memory concerns. Taking Aspirin  325 mg daily, Effexor  75 mg daily.  09/06/2022: Patient is reporting with episodes of changes of vision on 07/30/2022 and 08/18/2022. Notes that these episodes lasted for a couple of days. Had gray vision that appeared to be cut. Vision changes were secondary to a headache. Believes that this was a stroke. Denies going to the ER. Denies any slurred speech, confusion, facial drooping and one sided weakness. Patient is worried about memory loss. Difficulty recognizing faces and locations. Denies repeating words and conversations. Denies misplacing items. No changes in mood. Taking Aspirin  325 mg daily  and Effexor  75 mg at night per PCP.    MEDICATIONS Current Outpatient Medications  Medication Sig Dispense Refill  . acetaminophen  (TYLENOL ) 500 MG tablet Take 500 mg by mouth every 6 (six) hours as needed for Pain    . aspirin  325 MG tablet Take 325 mg by mouth once daily    . biotin 10 mg Tab Take 10 mg by mouth once daily       . butterbur root extract 50 mg Cap Take 1 capsule by mouth once daily as needed    . cyanocobalamin  (VITAMIN B12) 100 MCG tablet Take 1,000 mcg by mouth once daily    . ibuprofen  (MOTRIN ) 200 MG tablet Take 200 mg by mouth at bedtime    . magnesium oxide (MAG-OX) 400 mg (241.3 mg magnesium) tablet Take 400 mg by mouth once daily    . polyethylene glycol (MIRALAX ) powder Take 17 g by mouth once daily Mix in 4-8ounces of fluid prior to taking.    . pyridoxine, vitamin B6, (B-6) 100 MG tablet Take 100 mg by mouth once daily       . riboflavin (VITAMIN B-2) 100 mg tablet Take 1 tablet by mouth once daily      . rizatriptan (MAXALT) 10 MG tablet Take 10 mg at headache onset May take a second dose after 2 hours if needed.  No more than 2 doses in 24 hours 9 tablet 2  . simvastatin  (ZOCOR ) 5 MG tablet at bedtime    . valsartan  (DIOVAN ) 80 MG tablet Take 1 tablet (80 mg total) by mouth once daily 90 tablet 1  . venlafaxine  (EFFEXOR -XR) 75 MG XR  capsule Take 1 capsule (75 mg total) by mouth once daily 90 capsule 3  . amLODIPine  (NORVASC ) 5 MG tablet Take 1 tablet (5 mg total) by mouth nightly (Patient not taking: Reported on 06/13/2023) 30 tablet 11  . apixaban (ELIQUIS) 5 mg tablet Take 1 tablet (5 mg total) by mouth once daily for 15 days (Patient not taking: Reported on 06/22/2022) 15 tablet 0  . ascorbic acid, vitamin C, (VITAMIN C) 500 MG tablet Take 500 mg by mouth once daily. (Patient not taking: Reported on 10/23/2023)    . aspirin /acetaminophen /caffeine (EXCEDRIN MIGRAINE ORAL) Take by mouth (Patient not taking: Reported on 06/22/2022)    . beta-carotene,A,-vits  C,E/mins (OCUVITE ORAL) Take by mouth once daily (Patient not taking: Reported on 10/23/2023)    . blood glucose diagnostic test strip Check sugar once daily , DX R73.9-needs strips for One touch ultra 2 (Patient not taking: Reported on 10/23/2023)    . cholecalciferol, vitamin D3, 10 mcg (400 unit) Cap Take 400 Units by mouth once daily    (Patient not taking: Reported on 09/06/2022)    . clopidogreL  (PLAVIX ) 75 mg tablet Take 75 mg by mouth once daily (Patient not taking: Reported on 06/22/2022)    . coenzyme Q10-vitamin E 100-100 mg-unit Cap Take 100 mg by mouth once daily. (Patient not taking: Reported on 09/06/2022)    . HYDROcodone -acetaminophen  (NORCO) 5-325 mg tablet Take 1-2 tablets by mouth every 6 (six) hours as needed for Pain Can take up to 2 tabs every 6 hours for pain. (Patient not taking: Reported on 09/06/2022) 20 tablet 0  . Lactobacillus acidophilus 10 billion cell Cap Take 1 tablet by mouth once daily    (Patient not taking: Reported on 09/06/2022)    . lancets Check sugar once daily DX 73.9 needs for one touch ultra 2 lancets (Patient not taking: Reported on 10/23/2023)    . olopatadine (PATADAY) 0.2 % ophthalmic solution Place 1 drop into both eyes as directed for Allergies. (Patient not taking: Reported on 09/06/2022)    . omega 3-dha-epa-fish oil 1,000 mg (120 mg-180 mg) Cap Take 1 capsule by mouth once daily    (Patient not taking: Reported on 06/22/2022)    . pravastatin  (PRAVACHOL ) 10 MG tablet Take 1 tablet (10 mg total) by mouth nightly (Patient not taking: Reported on 10/23/2023) 90 tablet 3  . rosuvastatin  (CRESTOR ) 20 MG tablet Take 20 mg by mouth once daily (Patient not taking: Reported on 10/23/2023)    . venlafaxine  (EFFEXOR  XR) 150 MG XR capsule Take 1 capsule (150 mg total) by mouth once daily (Patient not taking: Reported on 10/23/2023) 30 capsule 2   No current facility-administered medications for this visit.    ALLERGIES Allergies  Allergen Reactions  . Linaclotide  Other (See Comments)    Pt had bad dehydration  . Amoxicillin Unknown  . Baby Oil [Lanolin-Mineral Oil] Unknown  . Bactrim [Sulfamethoxazole-Trimethoprim] Unknown  . Blue Dye Unknown  . Cortisone Rash and Headache  . Dye Other (See Comments)  . Green Dye Unknown and Other (See Comments)    Other reaction(s): Unknown  . Levaquin [Levofloxacin] Rash  . Nexium [Esomeprazole Magnesium] Headache  . Nortriptyline Headache  . Nortriptyline Hcl Other (See Comments)    Migraine  . Other Rash    Ceruem (eardrops) caused rash when it was spilled on back  . Prednisone Headache    RED SPLOTCHY FACE  . Tetracycline Headache  . Vibramycin [Doxycycline Calcium ] Unknown  . Diphenhydramine Hcl Unknown and  Rash    As long as no dyes in it. SHE USES CLEAR ONLY  . Sulfa (Sulfonamide Antibiotics) Unknown and Rash     EXAM   Vitals:   10/23/23 1340  BP: 134/82  Weight: 83.5 kg (184 lb)  Height: 167.6 cm (5' 6)  PainSc: 0-No pain      Body mass index is 29.7 kg/m.  GENERAL: Pleasant female, in no acute distress. Normocephalic and atraumatic.  EYES: PERRLA EOM's intact  MUSCULOSKELETAL: Bulk - Normal Tone - Normal Pronator Drift - Absent bilaterally. Ambulation - patient ambulating with cane in the office today   Romberg - moderately positive.  R/L 5/5    Shoulder abduction (deltoid/supraspinatus, axillary/suprascapular n, C5) 5/5    Elbow flexion (biceps brachii, musculoskeletal n, C5-6) 5/5    Elbow extension (triceps, radial n, C7) 5/5    Finger adduction (interossei, ulnar n, T1)  5/5    Hip flexion (iliopsoas, L1/L2) 4/5    Knee flexion (hamstrings, sciatic n, L5/S1)  4/5    Knee extension (quadriceps, femoral n, L3/4) 5/5    Ankle dorsiflexion (tibialis anterior, deep fibular n, L4/5) 5/5    Ankle plantarflexion (gastroc, tibial n, S1)   NEUROLOGICAL: MENTAL STATUS: Patient is oriented to person, place and time.   Short-term memory is intact Long-term memory is  intact.   Attention span and concentration are intact.   Naming and repetition are intact. Comprehension is intact.   Expressive speech is intact.   Patient's fund of knowledge is within normal limits for educational level.  CRANIAL NERVES: Visual acuity and visual fields are intact         Extraocular muscles are intact                        Facial sensation is intact bilaterally                Facial strength is intact bilaterally                   Hearing is intact bilaterally                              Palate elevates midline, normal phonation     Shoulder shrug strength is intact                    Tongue protrudes midline                       SENSATION: Pain and temperature (spinothalamic tracts) is normal. Position and vibration (dorsal columns) is normal.  COORDINATION/CEREBELLAR: Finger to nose testing is normal.   PAST MEDICAL HISTORY Past Medical History:  Diagnosis Date  . Allergic state   . Ataxia   . Concussion   . DVT (deep vein thrombosis) in pregnancy (HHS-HCC) 12/2005   with PE, was on Coumadin for 6 months  . GERD (gastroesophageal reflux disease)   . Glaucoma   . Hammer toe   . History of loop recorder   . History of migraine headaches    severe, after having anesthesia  . History of shingles   . Hypercalcemia   . Hypercholesterolemia   . Hyperglycemia   . Liver cyst   . Schatzki's ring   . Seasonal allergies   . Sleep apnea   . Sliding hiatal hernia   . Stroke (CMS/HHS-HCC)   . TIA (  transient ischemic attack) 1996   with no residual effects  . Vitamin D  deficiency     PAST SURGICAL HISTORY Past Surgical History:  Procedure Laterality Date  . Right ankle fracture repair  11/2005  . Right knee ligament repair  June 2008  . UPPER GASTROINTESTINAL ENDOSCOPY  01/03/2011   03/09/2009, 03/01/2004  . COLONOSCOPY  03/22/2011   04/28/2003  . cataract surgery Bilateral 11/10/19 & 11/25/19  . HERNIA REPAIR      FAMILY HISTORY Family History   Problem Relation Name Age of Onset  . Parkinsonism Mother    . Heart failure Mother    . Diabetes Father    . Heart failure Father    . Scleroderma Sister    . Hernia Sister    . Colon cancer Brother      SOCIAL HISTORY  Social History   Tobacco Use  . Smoking status: Never  . Smokeless tobacco: Never  Vaping Use  . Vaping status: Never Used  Substance Use Topics  . Alcohol use: No  . Drug use: No     REVIEW OF SYSTEMS:  13 system ROS was verbally reviewed with patient. Pertinent positives and negatives are mentioned above in the HPI and all other systems are negative.   DATA  I have personally reviewed all of the data outlined below both prior to the appointment and during the appointment with the patient as appropriate.  No visits with results within 6 Month(s) from this visit.  Latest known visit with results is:  Office Visit on 07/21/2020  Component Date Value Ref Range Status  . Vent Rate (bpm) 07/21/2020 80   Final  . PR Interval (msec) 07/21/2020 144   Final  . QRS Interval (msec) 07/21/2020 72   Final  . QT Interval (msec) 07/21/2020 356   Final  . QTc (msec) 07/21/2020 410   Final      No follow-ups on file.  Payor: MEDICARE / Plan: MEDICARE A AND B / Product Type: Medicare /   This note is partially written by Greig Pouch, scribe, in the presence of and acting as the scribe of Dr. Arthea Farrow.  I have reviewed, edited and added to the note as needed to reflect my best personal medical judgment.    Dr. Arthea Farrow, MD [facility] [place], [place] Ph:  [phone] Fax:  [phone]

## 2023-10-25 ENCOUNTER — Ambulatory Visit: Admitting: Nurse Practitioner

## 2023-10-30 ENCOUNTER — Other Ambulatory Visit: Payer: Self-pay | Admitting: Cardiovascular Disease

## 2023-11-08 NOTE — Progress Notes (Signed)
 Stroke code orderset ordered which included alcohol/drug and aptt lab

## 2023-11-19 ENCOUNTER — Ambulatory Visit (INDEPENDENT_AMBULATORY_CARE_PROVIDER_SITE_OTHER)

## 2023-11-19 DIAGNOSIS — I639 Cerebral infarction, unspecified: Secondary | ICD-10-CM

## 2023-11-19 LAB — CUP PACEART REMOTE DEVICE CHECK
Date Time Interrogation Session: 20250921233200
Implantable Pulse Generator Implant Date: 20240605

## 2023-11-20 NOTE — Progress Notes (Signed)
 Remote Loop Recorder Transmission

## 2023-11-21 ENCOUNTER — Ambulatory Visit: Payer: Self-pay | Admitting: Cardiology

## 2023-12-03 NOTE — Progress Notes (Signed)
 Remote Loop Recorder Transmission

## 2023-12-04 ENCOUNTER — Ambulatory Visit (INDEPENDENT_AMBULATORY_CARE_PROVIDER_SITE_OTHER): Admitting: Nurse Practitioner

## 2023-12-04 ENCOUNTER — Encounter: Payer: Self-pay | Admitting: Nurse Practitioner

## 2023-12-04 VITALS — BP 136/81 | HR 81 | Temp 97.0°F | Resp 16 | Ht 66.0 in | Wt 188.2 lb

## 2023-12-04 DIAGNOSIS — E782 Mixed hyperlipidemia: Secondary | ICD-10-CM

## 2023-12-04 DIAGNOSIS — E2839 Other primary ovarian failure: Secondary | ICD-10-CM

## 2023-12-04 DIAGNOSIS — I479 Paroxysmal tachycardia, unspecified: Secondary | ICD-10-CM | POA: Diagnosis not present

## 2023-12-04 DIAGNOSIS — Z8673 Personal history of transient ischemic attack (TIA), and cerebral infarction without residual deficits: Secondary | ICD-10-CM | POA: Diagnosis not present

## 2023-12-04 DIAGNOSIS — F068 Other specified mental disorders due to known physiological condition: Secondary | ICD-10-CM | POA: Diagnosis not present

## 2023-12-04 MED ORDER — EZETIMIBE 10 MG PO TABS: 10.0000 mg | ORAL_TABLET | Freq: Every day | ORAL | 1 refills | Status: DC

## 2023-12-04 MED ORDER — VENLAFAXINE HCL ER 75 MG PO CP24
ORAL_CAPSULE | ORAL | 1 refills | Status: AC
Start: 2023-12-04 — End: ?

## 2023-12-04 NOTE — Progress Notes (Signed)
 [facility] [address] [place], [place] [address]  Internal MEDICINE  Office Visit Note  Patient Name: Virginia Phillips  [id number]  [id number]  Date of Service: 12/04/2023  Chief Complaint  Patient presents with   Gastroesophageal Reflux   Hypertension   Hyperlipidemia   Follow-up    HPI [place] presents for a follow-up visit for recent ED visit, screening and lab results, and imaging results from ED  ED visit -- labs were ok and nothing was found. The ED visit was in late August. She had an appointment scheduled a few days after the visit but then rescheduled it to today.  Cologuard was negative.  CT head and MRI brain showed 2 old strokes but no new strokes. Acute stroke was ruled out and she did have a follow up visit with her neurologist the week after her ED visit.  Increase anxiety due to medical problems, esp history of multiple strokes.     Current Medication: Outpatient Encounter Medications as of 12/04/2023  Medication Sig   acetaminophen  (TYLENOL ) 650 MG CR tablet Take 650 mg by mouth every 8 (eight) hours as needed for pain.   aspirin  325 MG tablet Take 325 mg by mouth daily.   aspirin -acetaminophen -caffeine (EXCEDRIN MIGRAINE) 250-250-65 MG tablet Take 2 tablets by mouth daily as needed for headache or migraine.   aspirin -sod bicarb-citric acid (ALKA-SELTZER) 325 MG TBEF tablet Take 650 mg by mouth every 6 (six) hours as needed (indigestion).   Biotin 5 MG CAPS Take 5,000 mg by mouth daily at 2 PM.   Cholecalciferol (VITAMIN D ) 2000 UNITS CAPS Take 2,000 Units by mouth daily at 2 PM.    Coenzyme Q10 (CO Q10) 200 MG CAPS Take 200 mg by mouth daily at 2 PM.    ezetimibe  (ZETIA ) 10 MG tablet Take 1 tablet (10 mg total) by mouth daily. Please call [phone] to schedule an overdue appointment with Dr. Timothy Gollan for future refills. Thank you. 1st attempt.   ibuprofen  (ADVIL ) 800 MG tablet Take 1 tablet (800 mg total) by mouth every 8 (eight) hours as  needed (joint pains).   Lancets (ONETOUCH ULTRASOFT) lancets Check sugar once daily DX 73.9 needs for one touch ultra 2 lancets   Magnesium 400 MG TABS Take by mouth daily.   Misc Natural Products (PETADOLEX 50) 50 MG CAPS Take 100 mg by mouth daily.   multivitamin-lutein (OCUVITE-LUTEIN) CAPS capsule Take 1 capsule by mouth daily.   omega-3 acid ethyl esters (LOVAZA ) 1 g capsule Take 1 capsule (1 g total) by mouth 2 (two) times daily.   omeprazole (PRILOSEC OTC) 20 MG tablet Take 20 mg by mouth daily.   ONE TOUCH ULTRA TEST test strip CHECK BLOOD SUGAR ONCE DAILY AS DIRECTED   pilocarpine  (SALAGEN ) 5 MG tablet Take 5 mg by mouth 2 (two) times daily.   polyethylene glycol (MIRALAX  / GLYCOLAX ) packet Take 17 g by mouth daily.    Polyvinyl Alcohol-Povidone (REFRESH OP) Place 1 drop into both eyes daily as needed (dry eyes).   pyridOXINE (VITAMIN B-6) 100 MG tablet Take 100 mg by mouth daily at 2 PM.   Riboflavin (B2) 100 MG TABS Take 100 mg by mouth 2 (two) times daily.   simvastatin  (ZOCOR ) 20 MG tablet Take 1 tablet (20 mg total) by mouth at bedtime.   Thiamine HCl (VITAMIN B-1) 250 MG tablet Take 250 mg by mouth daily at 2 PM.   valsartan  (DIOVAN ) 80 MG tablet TAKE 1 TABLET(80 MG) BY MOUTH  EVERY DAY   venlafaxine  XR (EFFEXOR -XR) 75 MG 24 hr capsule TAKE 1 CAPSULE BY MOUTH DAILY AT 2 PM   vitamin B-12 (CYANOCOBALAMIN ) 1000 MCG tablet Take 1,000 mcg by mouth daily at 2 PM.   vitamin C (ASCORBIC ACID) 500 MG tablet Take 500 mg by mouth daily at 2 PM.   [DISCONTINUED] ezetimibe  (ZETIA ) 10 MG tablet Take 1 tablet (10 mg total) by mouth daily. Please call [phone] to schedule an overdue appointment with Dr. Timothy Gollan for future refills. Thank you. 1st attempt.   [DISCONTINUED] venlafaxine  XR (EFFEXOR -XR) 75 MG 24 hr capsule TAKE 1 CAPSULE BY MOUTH DAILY AT 2 PM   No facility-administered encounter medications on file as of 12/04/2023.    Surgical History: Past Surgical History:   Procedure Laterality Date   ANKLE FRACTURE SURGERY Right 2007   COLONOSCOPY WITH PROPOFOL  N/A 05/10/2017   Procedure: COLONOSCOPY WITH PROPOFOL ;  Surgeon: Janalyn Keene NOVAK, MD;  Location: [facility] ENDOSCOPY;  Service: Endoscopy;  Laterality: N/A;   ESOPHAGOGASTRODUODENOSCOPY (EGD) WITH PROPOFOL  N/A 10/17/2017   Procedure: ESOPHAGOGASTRODUODENOSCOPY (EGD) WITH PROPOFOL ;  Surgeon: Janalyn Keene NOVAK, MD;  Location: [facility] ENDOSCOPY;  Service: Endoscopy;  Laterality: N/A;   GANGLION CYST EXCISION Right    wrist   HERNIA REPAIR     2011   KNEE ARTHROSCOPY W/ ACL RECONSTRUCTION Right 2008   KNEE SURGERY  08/05/2007   LASIK     NISSEN FUNDOPLICATION  2012    Medical History: Past Medical History:  Diagnosis Date   Coronary artery disease    GERD (gastroesophageal reflux disease)    Glaucoma    Headache    migraines   History of shingles    Hyperlipidemia    Hypertension    Pre-diabetes    Pulmonary embolism on right 2007   right lung   Sjogren's syndrome    Stroke (HCC) 1998    Family History: Family History  Problem Relation Age of Onset   Congestive Heart Failure Mother    Parkinson's disease Mother    Diabetes Father    Scleroderma Sister    Cancer Brother    Breast cancer Neg Hx     Social History   Socioeconomic History   Marital status: Married    Spouse name: Not on file   Number of children: 1   Years of education: Not on file   Highest education level: Some college, no degree  Occupational History   Occupation: retired  Tobacco Use   Smoking status: Never   Smokeless tobacco: Never  Vaping Use   Vaping status: Never Used  Substance and Sexual Activity   Alcohol use: No    Alcohol/week: 0.0 standard drinks of alcohol   Drug use: No   Sexual activity: Not on file  Other Topics Concern   Not on file  Social History Narrative   Not on file   Social Drivers of Health   Financial Resource Strain: Low Risk  (06/14/2017)   Overall Financial Resource  Strain (CARDIA)    Difficulty of Paying Living Expenses: Not hard at all  Food Insecurity: No Food Insecurity (12/05/2021)   Hunger Vital Sign    Worried About Running Out of Food in the Last Year: Never true    Ran Out of Food in the Last Year: Never true  Transportation Needs: No Transportation Needs (12/05/2021)   PRAPARE - Administrator, Civil Service (Medical): No    Lack of Transportation (Non-Medical): No  Physical Activity: Not  on file  Stress: No Stress Concern Present (06/14/2017)   [employer] of Occupational Health - Occupational Stress Questionnaire    Feeling of Stress : Only a little  Social Connections: Not on file  Intimate Partner Violence: Not At Risk (12/05/2021)   Humiliation, Afraid, Rape, and Kick questionnaire    Fear of Current or Ex-Partner: No    Emotionally Abused: No    Physically Abused: No    Sexually Abused: No      Review of Systems  Constitutional:  Negative for chills, fatigue and unexpected weight change.  HENT:  Negative for congestion, postnasal drip, rhinorrhea, sneezing and sore throat.   Eyes:  Negative for redness.  Respiratory:  Negative for cough, chest tightness and shortness of breath.   Cardiovascular:  Negative for chest pain, palpitations and leg swelling.  Gastrointestinal:  Negative for abdominal pain, constipation, diarrhea, nausea and vomiting.  Genitourinary:  Negative for dysuria and frequency.  Musculoskeletal:  Positive for arthralgias and myalgias. Negative for back pain, joint swelling and neck pain.  Skin:  Negative for rash.  Neurological: Negative.  Negative for tremors and numbness.  Hematological:  Negative for adenopathy. Does not bruise/bleed easily.  Psychiatric/Behavioral:  Negative for behavioral problems (Depression), sleep disturbance and suicidal ideas. The patient is not nervous/anxious.     Vital Signs: BP 136/81   Pulse 81   Temp (!) 97 F (36.1 C)   Resp 16   Ht 5' 6 (1.676 m)    Wt 188 lb 3.2 oz (85.4 kg)   SpO2 93%   BMI 30.38 kg/m    Physical Exam Vitals reviewed.  Constitutional:      General: She is not in acute distress.    Appearance: Normal appearance. She is not ill-appearing.  HENT:     Head: Normocephalic and atraumatic.  Eyes:     Pupils: Pupils are equal, round, and reactive to light.  Cardiovascular:     Rate and Rhythm: Normal rate and regular rhythm.  Pulmonary:     Effort: Pulmonary effort is normal. No respiratory distress.  Neurological:     Mental Status: She is alert and oriented to person, place, and time.  Psychiatric:        Mood and Affect: Mood normal.        Behavior: Behavior normal.        Assessment/Plan: 1. Paroxysmal tachycardia (HCC) (Primary) No recent episodes at this time. Continue to monitor.   2. 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  - venlafaxine  XR (EFFEXOR -XR) 75 MG 24 hr capsule; TAKE 1 CAPSULE BY MOUTH DAILY AT 2 PM  Dispense: 90 capsule; Refill: 1  3. Mixed hyperlipidemia Continue simvastatin  and zetia  as prescribed  - ezetimibe  (ZETIA ) 10 MG tablet; Take 1 tablet (10 mg total) by mouth daily.  Dispense: 90 tablet; Refill: 1  4. History of stroke Continue simvastatin  and zetia  as prescribed.  - ezetimibe  (ZETIA ) 10 MG tablet; Take 1 tablet (10 mg total) by mouth daily.   Dispense: 90 tablet; Refill: 1  5. 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  - venlafaxine  XR (EFFEXOR -XR) 75 MG 24 hr capsule; TAKE 1 CAPSULE BY MOUTH DAILY AT 2 PM  Dispense: 90 capsule; Refill: 1   General Counseling: Bailea verbalizes understanding of the findings of todays visit and agrees with plan of treatment. I have discussed any further diagnostic evaluation that may be needed or ordered today. We also reviewed her medications today.  she has been encouraged to call the office with any questions or concerns that should arise related to todays  visit.    No orders of the defined types were placed in this encounter.   Meds ordered this encounter  Medications   ezetimibe  (ZETIA ) 10 MG tablet    Sig: Take 1 tablet (10 mg total) by mouth daily. Please call [phone] to schedule an overdue appointment with Dr. Timothy Gollan for future refills. Thank you. 1st attempt.    Dispense:  90 tablet    Refill:  1    Appointment needed for additional refills. Thank you.   venlafaxine  XR (EFFEXOR -XR) 75 MG 24 hr capsule    Sig: TAKE 1 CAPSULE BY MOUTH DAILY AT 2 PM    Dispense:  90 capsule    Refill:  1    For future refills    Return in about 4 months (around 04/05/2024) for F/U, Zyniah Ferraiolo PCP.   Total time spent:30 Minutes Time spent includes review of chart, medications, test results, and follow up plan with the patient.   [place] Controlled Substance Database was reviewed by me.  This patient was seen by Mardy Maxin, FNP-C in collaboration with Dr. Sigrid Bathe as a part of collaborative care agreement.   Ademola Vert R. Maxin, MSN, FNP-C Internal medicine

## 2023-12-07 DIAGNOSIS — H1013 Acute atopic conjunctivitis, bilateral: Secondary | ICD-10-CM | POA: Diagnosis not present

## 2023-12-07 DIAGNOSIS — H40003 Preglaucoma, unspecified, bilateral: Secondary | ICD-10-CM | POA: Diagnosis not present

## 2023-12-07 DIAGNOSIS — H01003 Unspecified blepharitis right eye, unspecified eyelid: Secondary | ICD-10-CM | POA: Diagnosis not present

## 2023-12-07 DIAGNOSIS — H02889 Meibomian gland dysfunction of unspecified eye, unspecified eyelid: Secondary | ICD-10-CM | POA: Diagnosis not present

## 2023-12-19 ENCOUNTER — Encounter

## 2023-12-20 ENCOUNTER — Encounter

## 2023-12-20 ENCOUNTER — Ambulatory Visit

## 2023-12-20 DIAGNOSIS — I639 Cerebral infarction, unspecified: Secondary | ICD-10-CM

## 2023-12-20 LAB — CUP PACEART REMOTE DEVICE CHECK
Date Time Interrogation Session: 20251022232649
Implantable Pulse Generator Implant Date: 20240605

## 2023-12-22 NOTE — Progress Notes (Signed)
 Remote Loop Recorder Transmission

## 2023-12-24 ENCOUNTER — Ambulatory Visit: Payer: Self-pay | Admitting: Cardiology

## 2024-01-06 DIAGNOSIS — F068 Other specified mental disorders due to known physiological condition: Secondary | ICD-10-CM | POA: Insufficient documentation

## 2024-01-06 DIAGNOSIS — E2839 Other primary ovarian failure: Secondary | ICD-10-CM | POA: Insufficient documentation

## 2024-01-09 ENCOUNTER — Other Ambulatory Visit: Payer: Self-pay | Admitting: Cardiovascular Disease

## 2024-01-09 DIAGNOSIS — E782 Mixed hyperlipidemia: Secondary | ICD-10-CM

## 2024-01-09 DIAGNOSIS — Z8673 Personal history of transient ischemic attack (TIA), and cerebral infarction without residual deficits: Secondary | ICD-10-CM

## 2024-01-19 ENCOUNTER — Encounter

## 2024-01-20 ENCOUNTER — Ambulatory Visit

## 2024-01-21 ENCOUNTER — Encounter

## 2024-01-23 ENCOUNTER — Ambulatory Visit

## 2024-01-23 DIAGNOSIS — I639 Cerebral infarction, unspecified: Secondary | ICD-10-CM

## 2024-01-23 LAB — CUP PACEART REMOTE DEVICE CHECK
Date Time Interrogation Session: 20251125232855
Implantable Pulse Generator Implant Date: 20240605

## 2024-01-25 NOTE — Progress Notes (Signed)
 Remote Loop Recorder Transmission

## 2024-01-28 ENCOUNTER — Ambulatory Visit: Payer: Self-pay | Admitting: Cardiology

## 2024-01-28 ENCOUNTER — Telehealth: Payer: Self-pay | Admitting: Cardiovascular Disease

## 2024-01-28 DIAGNOSIS — Z8673 Personal history of transient ischemic attack (TIA), and cerebral infarction without residual deficits: Secondary | ICD-10-CM

## 2024-01-28 DIAGNOSIS — E782 Mixed hyperlipidemia: Secondary | ICD-10-CM

## 2024-01-28 NOTE — Telephone Encounter (Signed)
*  STAT* If patient is at the pharmacy, call can be transferred to refill team.   1. Which medications need to be refilled? (please list name of each medication and dose if known)   ezetimibe  (ZETIA ) 10 MG tablet  simvastatin  (ZOCOR ) 20 MG tablet   2. Would you like to learn more about the convenience, safety, & potential cost savings by using the [facility]? No      3. Are you open to using the [facility] (Type [facility]. ). No    4. Which pharmacy/location (including street and city if local pharmacy) is medication to be sent to?  [id number] - [place], [place] - [address] OF [address]     5. Do they need a 30 day or 90 day supply? 90 days   Patient made an appt with Cadence Furth on 12/04 at 2:20 pm. Patient is almost out of meds

## 2024-01-29 ENCOUNTER — Other Ambulatory Visit: Payer: Self-pay | Admitting: Cardiovascular Disease

## 2024-01-29 DIAGNOSIS — E782 Mixed hyperlipidemia: Secondary | ICD-10-CM

## 2024-01-29 DIAGNOSIS — Z8673 Personal history of transient ischemic attack (TIA), and cerebral infarction without residual deficits: Secondary | ICD-10-CM

## 2024-01-29 MED ORDER — EZETIMIBE 10 MG PO TABS: 10.0000 mg | ORAL_TABLET | Freq: Every day | ORAL | 0 refills | Status: DC

## 2024-01-29 MED ORDER — SIMVASTATIN 20 MG PO TABS: 20.0000 mg | ORAL_TABLET | Freq: Every day | ORAL | 0 refills | Status: AC

## 2024-01-29 NOTE — Telephone Encounter (Signed)
 Requested Prescriptions   Signed Prescriptions Disp Refills   ezetimibe  (ZETIA ) 10 MG tablet 30 tablet 0    Sig: Take 1 tablet (10 mg total) by mouth daily.    Authorizing Provider: PERLA EVALENE PARAS    Ordering User: Daeshaun Specht  C   simvastatin  (ZOCOR ) 20 MG tablet 30 tablet 0    Sig: Take 1 tablet (20 mg total) by mouth at bedtime.    Authorizing Provider: PERLA EVALENE PARAS    Ordering User: WILFRED, Edwin Cherian  C

## 2024-01-31 ENCOUNTER — Ambulatory Visit: Attending: Medical | Admitting: Medical

## 2024-01-31 ENCOUNTER — Encounter: Payer: Self-pay | Admitting: Medical

## 2024-01-31 ENCOUNTER — Other Ambulatory Visit: Payer: Self-pay | Admitting: Nurse Practitioner

## 2024-01-31 VITALS — BP 130/77 | HR 70 | Ht 66.0 in | Wt 187.6 lb

## 2024-01-31 DIAGNOSIS — Z79899 Other long term (current) drug therapy: Secondary | ICD-10-CM | POA: Insufficient documentation

## 2024-01-31 DIAGNOSIS — I25118 Atherosclerotic heart disease of native coronary artery with other forms of angina pectoris: Secondary | ICD-10-CM | POA: Diagnosis not present

## 2024-01-31 DIAGNOSIS — I10 Essential (primary) hypertension: Secondary | ICD-10-CM | POA: Insufficient documentation

## 2024-01-31 DIAGNOSIS — E782 Mixed hyperlipidemia: Secondary | ICD-10-CM | POA: Insufficient documentation

## 2024-01-31 DIAGNOSIS — R0602 Shortness of breath: Secondary | ICD-10-CM | POA: Insufficient documentation

## 2024-01-31 DIAGNOSIS — R42 Dizziness and giddiness: Secondary | ICD-10-CM | POA: Insufficient documentation

## 2024-01-31 DIAGNOSIS — I639 Cerebral infarction, unspecified: Secondary | ICD-10-CM | POA: Insufficient documentation

## 2024-01-31 NOTE — Progress Notes (Signed)
 Cardiology Office Note   Date:  01/31/2024  ID:  Hadlee, Burback [birth year], MRN [id number] PCP: Liana Fish, NP  [facility] Providers Cardiologist:  Evalene Lunger, MD Electrophysiologist:  OLE ONEIDA HOLTS, MD   History of Present Illness Virginia Phillips is a 78 y.o. female with a hx of nonobstructive CAD by noninvasive imaging, prediabetes, HTN, HLD, carotid artery disease with further details unclear, OSA, hiatal hernia status post prior surgery, multiple medication intolerances, and secondhand tobacco exposure who presents for follow-up of CAD.   She was previously followed by Dr. Hester, though transitioned to Dr. Gollan in 07/2020.  Prior echo at [facility] in 03/2018 demonstrated an EF greater than 55%, normal wall motion, mild LVH, normal RV systolic function, and trivial mitral/tricuspid regurgitation.  She has previously undergone carotid artery ultrasound in 12/2019, though results are unavailable for review.   She established care with Dr. Gollan on 08/12/2020 for a second opinion regarding episodes of chest discomfort that would wake her from sleep and took several hours to improve with stuttering chest pain afterwards.  She did not feel like symptoms were related to her prior hiatal hernia.  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  Given symptoms, she underwent coronary CTA on 08/26/2020 showed a calcium  score of 231 which was the 76 percentile.  There was minimal nonobstructive CAD.  Incidentally, there was left to right intra-arterial communication noted.  Given calcium  score, pravastatin  was titrated to 20 mg.   She notified our office in 08/2020 she felt like she was off balance following titration of pravastatin  from 10 mg to 20 mg.  She was subsequently transition to rosuvastatin  5 mg.   Echo with bubble study on 10/05/2020 demonstrated an EF of 60 to 65%, mild LVH, grade 1  diastolic dysfunction, normal RV systolic function and ventricular cavity size, no significant valvular abnormalities, and estimated right atrial pressure of 3 mmHg, and agitated saline contrast bubble study was negative without evidence of any intra-arterial shunting.   The patient was seen 03/2021 reporting atypical chest pain and palpitations Heart monitor showed predominantly normal sinus rhythm, 7 runs of SVT, the run with the fastest interval lasting 18 beats with a max rate of 160 was also the longest. Isolated ectopy.   Patient was recently admitted in October 2023 for acute stroke.  She presented with left arm weakness.  MRI showed small punctate acute infarct.  Neurology saw the patient.  Many of the work-up was unrevealing and reassuring. Echo bubble study showed LVEF 55-60%, G1DD, positive with shunting observed within 3-6 cardiac cycles suggestive of interatiral shunt. Patient was discharged on DAPT with aspirin  and Plavix  x3 weeks followed by Plavix  monotherapy indefinitely. Statin was added to home medications.   The patient was last seen 09/18/22 and was stable from a cardiac perspective. She wanted to be on ASA 325 due to h/o stroke. She did not want to be on Plavix  due to dizziness.   Today, the patient is doing well from a cardiac perspective. The patient reports episodes of of dizziness, lightheadedness, heart fluttering, chest pains and shortness of breath. Symptoms mostly occur at night. Symptoms have waken her up at night, they improved with eating. Chest pain and SOB are not worse on exertion. She denies lower leg edema.   Studies Reviewed EKG Interpretation Date/Time:  Thursday January 31 2024 15:07:02 EST Ventricular Rate:  70 PR Interval:  160 QRS Duration:  86 QT Interval:  398 QTC Calculation: 429 R Axis:   -5  Text Interpretation: Normal sinus rhythm Cannot rule out Inferior infarct (cited on or before 16-Jun-2022) When compared with ECG of 17-Oct-2023 12:54, Criteria  for Anterior infarct are no longer Present T wave inversion no longer evident in Inferior leads Nonspecific T wave abnormality no longer evident in Anterior leads Confirmed by Franchester, Latica Hohmann ([id number]) on 01/31/2024 3:09:53 PM    Echo limited 07/2022  1. Left ventricular ejection fraction, by estimation, is 55 to 60%. The  left ventricle has normal function. The left ventricle has no regional  wall motion abnormalities.   2. Right ventricular systolic function is normal. The right ventricular  size is normal. Tricuspid regurgitation signal is inadequate for assessing  PA pressure.   3. The mitral valve is normal in structure. Mild mitral valve  regurgitation. No evidence of mitral stenosis.   4. The aortic valve is tricuspid. Aortic valve regurgitation is not  visualized. No aortic stenosis is present.   5. The inferior vena cava is normal in size with greater than 50%  respiratory variability, suggesting right atrial pressure of 3 mmHg.   Comparison(s): 12/05/21 55-60, + bubble.   Echo 11/2021  1. Left ventricular ejection fraction, by estimation, is 55 to 60%. The  left ventricle has normal function. The left ventricle has no regional  wall motion abnormalities. Left ventricular diastolic parameters are  consistent with Grade I diastolic  dysfunction (impaired relaxation).   2. Right ventricular systolic function is normal. The right ventricular  size is normal. Tricuspid regurgitation signal is inadequate for assessing  PA pressure.   3. The mitral valve is normal in structure. No evidence of mitral valve  regurgitation. No evidence of mitral stenosis.   4. The aortic valve is normal in structure. Aortic valve regurgitation is  not visualized. No aortic stenosis is present.   5. Agitated saline contrast bubble study was positive with shunting  observed within 3-6 cardiac cycles suggestive of interatrial shunt.   Heart monitor 01/2022 Normal sinus rhythm Patient had a min HR of 52  bpm, max HR of 171 bpm, and avg HR of 76 bpm.    5 Supraventricular Tachycardia runs occurred, the run with the fastest interval lasting 5 beats with a max rate of 171 bpm, the longest lasting 5 beats with an avg rate of 125 bpm.    Isolated SVEs were rare (<1.0%), SVE Couplets were rare (<1.0%), and SVE Triplets were rare (<1.0%).  Isolated VEs were rare (<1.0%), VE Couplets were rare (<1.0%), and no VE Triplets were present.    Triggered events associated with normal sinus rhythm  Echo 11/2021 1. Left ventricular ejection fraction, by estimation, is 55 to 60%. The  left ventricle has normal function. The left ventricle has no regional  wall motion abnormalities. Left ventricular diastolic parameters are  consistent with Grade I diastolic  dysfunction (impaired relaxation).   2. Right ventricular systolic function is normal. The right ventricular  size is normal. Tricuspid regurgitation signal is inadequate for assessing  PA pressure.   3. The mitral valve is normal in structure. No evidence of mitral valve  regurgitation. No evidence of mitral stenosis.   4. The aortic valve is normal in structure. Aortic valve regurgitation is  not visualized. No aortic stenosis is present.   5. Agitated saline contrast bubble study was positive with shunting  observed within 3-6 cardiac cycles suggestive of interatrial shunt.  Heart monitor 04/2021 Normal sinus rhythm Patient had a min HR of 52 bpm, max HR of 160 bpm, and avg HR of 78 bpm.    7 Supraventricular Tachycardia runs occurred, the run with the fastest interval lasting 18 beats with a max rate of 160 bpm (avg 142  bpm); the run with the fastest interval was also the longest.   Isolated SVEs were rare (<1.0%), SVE Couplets were rare (<1.0%), and SVE Triplets were rare (<1.0%).  Isolated VEs were rare (<1.0%), VE Couplets were rare (<1.0%), and no VE Triplets were  present.    1 patient triggered event associated with sinus  rhythm  Echo 2022  1. Left ventricular ejection fraction, by estimation, is 60 to 65%. The  left ventricle has normal function. Left ventricular endocardial border  not optimally defined to evaluate regional wall motion. There is mild left  ventricular hypertrophy. Left  ventricular diastolic parameters are consistent with Grade I diastolic  dysfunction (impaired relaxation).   2. Right ventricular systolic function is normal. The right ventricular  size is normal.   3. The mitral valve is grossly normal. No evidence of mitral valve  regurgitation. No evidence of mitral stenosis.   4. The aortic valve is normal in structure. Aortic valve regurgitation is  not visualized. No aortic stenosis is present.   5. The inferior vena cava is normal in size with greater than 50%  respiratory variability, suggesting right atrial pressure of 3 mmHg.   6. Agitated saline contrast bubble study was negative, with no evidence  of any interatrial shunt.   Cardiac CT 2022 Narrative & Impression    IMPRESSION: 1. Coronary calcium  score of 231. This was 76th percentile for age, sex, and race matched control.   2. Normal coronary origin with right dominance.   3. CAD-RADS 1. Minimal non-obstructive CAD (1-24%). Consider non-atherosclerotic causes of chest pain. Consider preventive therapy and risk factor modification.   4. There is a 4.9 X 3.5 X 2.2 mm left to right interatrial communication. Cannot exclude PFO.          Physical Exam VS:  BP 130/77 (BP Location: Left Arm, Patient Position: Sitting, Cuff Size: Normal)   Pulse 70   Ht 5' 6 (1.676 m)   Wt 187 lb 9.6 oz (85.1 kg)   SpO2 96%   BMI 30.28 kg/m        Wt Readings from Last 3 Encounters:  01/31/24 187 lb 9.6 oz (85.1 kg)  12/04/23 188 lb 3.2 oz (85.4 kg)  10/17/23 184 lb 1.4 oz (83.5 kg)    GEN: Well nourished, well developed in no acute distress NECK: No JVD; No carotid bruits CARDIAC: RRR, no murmurs, rubs,  gallops RESPIRATORY:  Clear to auscultation without rales, wheezing or rhonchi  ABDOMEN: Soft, non-tender, non-distended EXTREMITIES:  No edema; No deformity   ASSESSMENT AND PLAN  SOB/chest pain Nonobstructive CAD The patient reports intermittent lightheadedness, dizziness, shortness of breath and chest pain that mainly occurs at night. Cardiac CTA in 2022 showed mild nonobstructive CAD. Limited echo 07/2022 shwoed LVEF 55-60%, no WMA, mild MR. I will order an echocardiogram. After discussion with the patient, we decided to wait on ischemic evaluation at this time. Continue ASA, Zetia , and simvastatin .   Dizziness and lightheadedness ILR report shows no arrhythmias.   HTN BP is normal. Continue Valsartan  80mg  daily.  HLD LDL 74. Continue simvastatin  20mg  daily and Zetia  10mg  daily.   Acute stroke s/p ILR This is monitor by EP.  No Afib noted.         Dispo: Follow-up in 4 months  Signed, Oyuki Hogan VEAR Fishman, PA-C

## 2024-01-31 NOTE — Patient Instructions (Signed)
 Medication Instructions:  Your physician recommends that you continue on your current medications as directed. Please refer to the Current Medication list given to you today.   *If you need a refill on your cardiac medications before your next appointment, please call your pharmacy*  Lab Work: Your provider would like for you to have following labs drawn today CBC, TSH, BMeT, and Mag.   If you have labs (blood work) drawn today and your tests are completely normal, you will receive your results only by: MyChart Message (if you have MyChart) OR A paper copy in the mail If you have any lab test that is abnormal or we need to change your treatment, we will call you to review the results.  Testing/Procedures: Your physician has requested that you have an echocardiogram. Echocardiography is a painless test that uses sound waves to create images of your heart. It provides your doctor with information about the size and shape of your heart and how well your heart's chambers and valves are working.   You may receive an ultrasound enhancing agent through an IV if needed to better visualize your heart during the echo. This procedure takes approximately one hour.  There are no restrictions for this procedure.  This will take place at [address] [facility] [address], [place] [address]  Please note: We ask at that you not bring children with you during ultrasound (echo/ vascular) testing. Due to room size and safety concerns, children are not allowed in the ultrasound rooms during exams. Our front office staff cannot provide observation of children in our lobby area while testing is being conducted. An adult accompanying a patient to their appointment will only be allowed in the ultrasound room at the discretion of the ultrasound technician under special circumstances. We apologize for any inconvenience.   Follow-Up: At [facility], you and your health needs are our priority.  As  part of our continuing mission to provide you with exceptional heart care, our providers are all part of one team.  This team includes your primary Cardiologist (physician) and Advanced Practice Providers or APPs (Physician Assistants and Nurse Practitioners) who all work together to provide you with the care you need, when you need it.  Your next appointment:   4 month(s)  Provider:   You may see Timothy Gollan, MD or one of the following Advanced Practice Providers on your designated Care Team:   Lonni Meager, NP Lesley Maffucci, PA-C Bernardino Bring, PA-C Cadence Carman, PA-C Tylene Lunch, NP Barnie Hila, NP

## 2024-02-01 ENCOUNTER — Ambulatory Visit: Payer: Self-pay | Admitting: Medical

## 2024-02-01 LAB — BASIC METABOLIC PANEL WITH GFR
BUN/Creatinine Ratio: 19 (ref 12–28)
BUN: 15 mg/dL (ref 8–27)
CO2: 25 mmol/L (ref 20–29)
Calcium: 9.9 mg/dL (ref 8.7–10.3)
Chloride: 100 mmol/L (ref 96–106)
Creatinine, Ser: 0.81 mg/dL (ref 0.57–1.00)
Glucose: 95 mg/dL (ref 70–99)
Potassium: 5.1 mmol/L (ref 3.5–5.2)
Sodium: 138 mmol/L (ref 134–144)
eGFR: 74 mL/min/1.73 (ref >59)

## 2024-02-01 LAB — CBC
Hematocrit: 43.2 % (ref 34.0–46.6)
Hemoglobin: 14.2 g/dL (ref 11.1–15.9)
MCH: 31.8 pg (ref 26.6–33.0)
MCHC: 32.9 g/dL (ref 31.5–35.7)
MCV: 97 fL (ref 79–97)
Platelets: 245 x10E3/uL (ref 150–450)
RBC: 4.47 x10E6/uL (ref 3.77–5.28)
RDW: 11.8 % (ref 11.7–15.4)
WBC: 6.2 x10E3/uL (ref 3.4–10.8)

## 2024-02-01 LAB — MAGNESIUM: Magnesium: 2.3 mg/dL (ref 1.6–2.3)

## 2024-02-01 LAB — TSH: TSH: 1.54 u[IU]/mL (ref 0.450–4.500)

## 2024-02-19 ENCOUNTER — Encounter

## 2024-02-23 ENCOUNTER — Ambulatory Visit: Attending: Cardiology

## 2024-02-23 DIAGNOSIS — I25118 Atherosclerotic heart disease of native coronary artery with other forms of angina pectoris: Secondary | ICD-10-CM

## 2024-02-25 LAB — CUP PACEART REMOTE DEVICE CHECK
Date Time Interrogation Session: 20251226231807
Implantable Pulse Generator Implant Date: 20240605

## 2024-03-01 ENCOUNTER — Ambulatory Visit: Payer: Self-pay | Admitting: Cardiology

## 2024-03-03 NOTE — Progress Notes (Signed)
 Remote Loop Recorder Transmission

## 2024-03-06 ENCOUNTER — Ambulatory Visit: Attending: Medical

## 2024-03-06 DIAGNOSIS — R0602 Shortness of breath: Secondary | ICD-10-CM | POA: Insufficient documentation

## 2024-03-06 DIAGNOSIS — Z79899 Other long term (current) drug therapy: Secondary | ICD-10-CM | POA: Insufficient documentation

## 2024-03-06 LAB — ECHOCARDIOGRAM COMPLETE
AR max vel: 1.58 cm2
AV Area VTI: 1.84 cm2
AV Area mean vel: 1.65 cm2
AV Mean grad: 6 mmHg
AV Peak grad: 11.3 mmHg
Ao pk vel: 1.68 m/s
Area-P 1/2: 3.19 cm2
S' Lateral: 2.69 cm

## 2024-03-21 ENCOUNTER — Encounter

## 2024-03-25 ENCOUNTER — Ambulatory Visit: Attending: Cardiology

## 2024-03-25 DIAGNOSIS — I639 Cerebral infarction, unspecified: Secondary | ICD-10-CM

## 2024-03-25 LAB — CUP PACEART REMOTE DEVICE CHECK
Date Time Interrogation Session: 20260126232923
Implantable Pulse Generator Implant Date: 20240605

## 2024-03-30 ENCOUNTER — Ambulatory Visit: Payer: Self-pay | Admitting: Cardiology

## 2024-04-03 NOTE — Progress Notes (Signed)
 Remote Loop Recorder Transmission

## 2024-04-08 ENCOUNTER — Ambulatory Visit: Admitting: Nurse Practitioner

## 2024-04-21 ENCOUNTER — Encounter

## 2024-04-25 ENCOUNTER — Ambulatory Visit

## 2024-05-26 ENCOUNTER — Ambulatory Visit

## 2024-06-05 ENCOUNTER — Ambulatory Visit: Admitting: Medical

## 2024-06-26 ENCOUNTER — Ambulatory Visit

## 2024-07-27 ENCOUNTER — Ambulatory Visit

## 2024-08-13 ENCOUNTER — Ambulatory Visit: Admitting: Nurse Practitioner

## 2024-08-27 ENCOUNTER — Ambulatory Visit

## 2024-09-27 ENCOUNTER — Ambulatory Visit

## 2024-10-28 ENCOUNTER — Ambulatory Visit

## 2024-11-28 ENCOUNTER — Ambulatory Visit

## 2024-12-29 ENCOUNTER — Ambulatory Visit

## 2025-01-29 ENCOUNTER — Ambulatory Visit

## 2025-03-01 ENCOUNTER — Ambulatory Visit
